# Patient Record
Sex: Female | Born: 1935
Health system: Southern US, Community
[De-identification: ages and names within clinical notes are randomized; demographics above are authoritative.]

## PROBLEM LIST (undated history)

## (undated) DIAGNOSIS — R252 Cramp and spasm: Secondary | ICD-10-CM

## (undated) DIAGNOSIS — K5900 Constipation, unspecified: Secondary | ICD-10-CM

## (undated) DIAGNOSIS — I2510 Atherosclerotic heart disease of native coronary artery without angina pectoris: Secondary | ICD-10-CM

## (undated) DIAGNOSIS — I10 Essential (primary) hypertension: Secondary | ICD-10-CM

## (undated) DIAGNOSIS — Z9289 Personal history of other medical treatment: Secondary | ICD-10-CM

## (undated) DIAGNOSIS — G459 Transient cerebral ischemic attack, unspecified: Secondary | ICD-10-CM

## (undated) DIAGNOSIS — D62 Acute posthemorrhagic anemia: Secondary | ICD-10-CM

## (undated) DIAGNOSIS — R011 Cardiac murmur, unspecified: Secondary | ICD-10-CM

## (undated) DIAGNOSIS — K862 Cyst of pancreas: Secondary | ICD-10-CM

## (undated) DIAGNOSIS — B952 Enterococcus as the cause of diseases classified elsewhere: Secondary | ICD-10-CM

## (undated) DIAGNOSIS — I219 Acute myocardial infarction, unspecified: Secondary | ICD-10-CM

## (undated) DIAGNOSIS — N390 Urinary tract infection, site not specified: Secondary | ICD-10-CM

## (undated) DIAGNOSIS — M1711 Unilateral primary osteoarthritis, right knee: Secondary | ICD-10-CM

## (undated) DIAGNOSIS — E119 Type 2 diabetes mellitus without complications: Secondary | ICD-10-CM

## (undated) DIAGNOSIS — J069 Acute upper respiratory infection, unspecified: Secondary | ICD-10-CM

## (undated) DIAGNOSIS — J189 Pneumonia, unspecified organism: Secondary | ICD-10-CM

## (undated) HISTORY — DX: Acute upper respiratory infection, unspecified: J06.9

## (undated) HISTORY — PX: ABDOMINAL HYSTERECTOMY: SHX81

## (undated) HISTORY — DX: Cardiac murmur, unspecified: R01.1

## (undated) HISTORY — DX: Atherosclerotic heart disease of native coronary artery without angina pectoris: I25.10

## (undated) HISTORY — PX: BUNIONECTOMY: SHX129

## (undated) HISTORY — DX: Transient cerebral ischemic attack, unspecified: G45.9

## (undated) HISTORY — DX: Personal history of other medical treatment: Z92.89

## (undated) HISTORY — PX: COLONOSCOPY: SHX174

## (undated) HISTORY — DX: Cyst of pancreas: K86.2

## (undated) HISTORY — DX: Type 2 diabetes mellitus without complications: E11.9

## (undated) HISTORY — DX: Essential (primary) hypertension: I10

## (undated) HISTORY — DX: Constipation, unspecified: K59.00

---

## 2005-02-22 ENCOUNTER — Ambulatory Visit (HOSPITAL_COMMUNITY): Admission: RE | Admit: 2005-02-22 | Discharge: 2005-02-22 | Payer: Self-pay | Admitting: *Deleted

## 2005-10-24 ENCOUNTER — Ambulatory Visit (HOSPITAL_BASED_OUTPATIENT_CLINIC_OR_DEPARTMENT_OTHER): Admission: RE | Admit: 2005-10-24 | Discharge: 2005-10-24 | Payer: Self-pay | Admitting: Orthopedic Surgery

## 2009-03-05 DIAGNOSIS — Z9289 Personal history of other medical treatment: Secondary | ICD-10-CM

## 2009-03-05 HISTORY — DX: Personal history of other medical treatment: Z92.89

## 2010-04-29 HISTORY — PX: COLONOSCOPY: SHX174

## 2010-08-01 ENCOUNTER — Encounter: Payer: Self-pay | Admitting: Orthopedic Surgery

## 2012-12-27 ENCOUNTER — Encounter: Payer: Self-pay | Admitting: Cardiovascular Disease

## 2013-01-15 ENCOUNTER — Other Ambulatory Visit (HOSPITAL_COMMUNITY): Payer: Self-pay

## 2013-02-11 ENCOUNTER — Ambulatory Visit (HOSPITAL_COMMUNITY): Admission: RE | Admit: 2013-02-11 | Payer: Medicare Other | Source: Ambulatory Visit

## 2013-02-22 ENCOUNTER — Ambulatory Visit (HOSPITAL_COMMUNITY): Payer: Medicare Other | Attending: Cardiovascular Disease

## 2013-02-25 DIAGNOSIS — I10 Essential (primary) hypertension: Secondary | ICD-10-CM

## 2013-03-01 ENCOUNTER — Other Ambulatory Visit: Payer: Self-pay | Admitting: Cardiovascular Disease

## 2013-03-01 LAB — CBC WITH DIFFERENTIAL/PLATELET
Basophils Absolute: 0.1 10*3/uL (ref 0.0–0.1)
Eosinophils Absolute: 0.1 10*3/uL (ref 0.0–0.7)
Eosinophils Relative: 1 % (ref 0–5)
HCT: 38 % (ref 36.0–46.0)
Hemoglobin: 13 g/dL (ref 12.0–15.0)
MCHC: 34.2 g/dL (ref 30.0–36.0)
MCV: 89.8 fL (ref 78.0–100.0)
Monocytes Absolute: 0.6 10*3/uL (ref 0.1–1.0)
Neutro Abs: 4.9 10*3/uL (ref 1.7–7.7)
RBC: 4.23 MIL/uL (ref 3.87–5.11)
WBC: 7 10*3/uL (ref 4.0–10.5)

## 2013-03-01 LAB — LIPID PANEL
LDL Cholesterol: 79 mg/dL (ref 0–99)
Total CHOL/HDL Ratio: 3.8 Ratio
VLDL: 43 mg/dL — ABNORMAL HIGH (ref 0–40)

## 2013-03-01 LAB — COMPREHENSIVE METABOLIC PANEL
ALT: 19 U/L (ref 0–35)
AST: 24 U/L (ref 0–37)
Alkaline Phosphatase: 81 U/L (ref 39–117)
CO2: 29 mEq/L (ref 19–32)
Calcium: 9.9 mg/dL (ref 8.4–10.5)
Creat: 1.25 mg/dL — ABNORMAL HIGH (ref 0.50–1.10)
Total Protein: 7 g/dL (ref 6.0–8.3)

## 2013-03-01 LAB — HEMOGLOBIN A1C: Mean Plasma Glucose: 120 mg/dL — ABNORMAL HIGH (ref <117)

## 2014-10-17 ENCOUNTER — Encounter: Payer: Self-pay | Admitting: Cardiology

## 2014-10-17 ENCOUNTER — Ambulatory Visit (INDEPENDENT_AMBULATORY_CARE_PROVIDER_SITE_OTHER): Payer: Medicare Other | Admitting: Cardiology

## 2014-10-17 VITALS — BP 128/92 | HR 56 | Ht 68.0 in | Wt 181.0 lb

## 2014-10-17 DIAGNOSIS — I351 Nonrheumatic aortic (valve) insufficiency: Secondary | ICD-10-CM

## 2014-10-17 NOTE — Patient Instructions (Signed)
Your physician recommends that you schedule a follow-up appointment in: [facility] after test  Your physician has requested that you have an echocardiogram. Echocardiography is a painless test that uses sound waves to create images of your heart. It provides your doctor with information about the size and shape of your heart and how well your heart's chambers and valves are working. This procedure takes approximately one hour. There are no restrictions for this procedure.In [place]

## 2014-10-17 NOTE — Progress Notes (Signed)
Cardiology Office Note   Date:  10/17/2014   ID:  [name], DOB 01-25-1935, MRN [id number]  PCP:  Glenda Chroman., MD  Cardiologist:   Minus Breeding, MD   Chief Complaint  Patient presents with  . New Evaluation    [id number]->[id number]  . Chest Pain    associated with pain in her back between her shoulder blades      History of Present Illness: [name] is a 80 y.o. female who presents for follow-up of aortic insufficiency. She previously saw Dr. Rollene Fare and is new to me. She had previous treadmill testing as below. She did have some aortic insufficiency with her last echocardiogram showing mild to moderate aortic insufficiency 2 years ago. She presents for follow-up. The patient denies any new symptoms such as neck or arm discomfort. There has been no new shortness of breath, PND or orthopnea. There have been no reported palpitations, presyncope or syncope.  She does have some joint pain but she does all of her household chores.  She recently [place] and with all of the kneeling and genuflecting she had some significant joint pain.  Of note there was some chest discomfort a couple of months ago when she was stressed. However, this was right-sided and around to her right shoulder. She's not had any recurrence of this.   Past Medical History  Diagnosis Date  . Diabetes   . HTN (hypertension)   . Murmur, cardiac     echo 02/25/13- EF 60-65%, impaired relaxation-grade 1 diastolic dysfunction, mild concentric lvh, mild to moderate aortic regurgitation  . H/O cardiovascular stress test 03/05/2009    normal, no ischemia  . TIA (transient ischemic attack)     carotid doppler 05/08/11-normal patency  . H/O Doppler ultrasound     lower ext arterial doppler 04/13/09-no evidence of arterial insufficiency, normal values; distal aorta 2.4x2.7    Past Surgical History  Procedure Laterality Date  . Abdominal hysterectomy       Current Outpatient Prescriptions   Medication Sig Dispense Refill  . aspirin 81 MG tablet Take 81 mg by mouth daily.    . diclofenac (VOLTAREN) 75 MG EC tablet Take 75 mg by mouth 2 (two) times daily.    Marland Kitchen glipiZIDE (GLUCOTROL XL) 5 MG 24 hr tablet Take 5 mg by mouth daily.    . metFORMIN (GLUCOPHAGE) 500 MG tablet Take 500 mg by mouth 2 (two) times daily.    . metoprolol (LOPRESSOR) 50 MG tablet Take 50 mg by mouth 2 (two) times daily.    . valsartan-hydrochlorothiazide (DIOVAN-HCT) 320-12.5 MG per tablet Take by mouth daily.    Marland Kitchen VICTOZA 18 MG/3ML SOPN 9 mLs daily.     No current facility-administered medications for this visit.    Allergies:   Codeine    Social History:  The patient  reports that she has never smoked. She does not have any smokeless tobacco history on file. She reports that she does not drink alcohol.   Family History:  The patient's family history includes Cancer in her father and sister; Diabetes in her brother, brother, and brother; Stroke in her mother.    ROS:  Please see the history of present illness.   Otherwise, review of systems are positive for decreased hearing, tinnitus, occasional diarrhea, joint pains, reflux, nocturia, edema..   All other systems are reviewed and negative.    PHYSICAL EXAM: VS:  BP 128/92 mmHg  Pulse 56  Ht 5'  8" (1.727 m)  Wt 181 lb (82.101 kg)  BMI 27.53 kg/m2 , BMI Body mass index is 27.53 kg/(m^2). GENERAL:  Well appearing HEENT:  Pupils equal round and reactive, fundi not visualized, oral mucosa unremarkable NECK:  No jugular venous distention, waveform within normal limits, carotid upstroke brisk and symmetric, no bruits, no thyromegaly LYMPHATICS:  No cervical, inguinal adenopathy LUNGS:  Clear to auscultation bilaterally BACK:  No CVA tenderness CHEST:  Unremarkable HEART:  PMI not displaced or sustained,S1 and S2 within normal limits, no S3, no S4, no clicks, no rubs, no murmurs ABD:  Flat, positive bowel sounds normal in frequency in pitch, no  bruits, no rebound, no guarding, no midline pulsatile mass, no hepatomegaly, no splenomegaly EXT:  2 plus pulses throughout, no edema, no cyanosis no clubbing SKIN:  No rashes no nodules NEURO:  Cranial nerves II through XII grossly intact, motor grossly intact throughout PSYCH:  Cognitively intact, oriented to person place and time    EKG:  EKG is ordered today. The ekg ordered today demonstrates sinus rhythm, rate 56, left axis deviation, poor anterior R wave progression, no acute ST-T wave changes.   Recent Labs: No results found for requested labs within last 365 days.     Wt Readings from Last 3 Encounters:  10/17/14 181 lb (82.101 kg)      Other studies Reviewed: Additional studies/ records that were reviewed today include: Echo, stress test. Review of the above records demonstrates:  Please see elsewhere in the note.     ASSESSMENT AND PLAN:  AI:  I don't appreciate a murmur. However, this was moderate on exam 2 years ago and so she will have a repeat echo. Since she lives in [place] we will have her follow-up there.  CHEST PAIN:  She had some very atypical chest pain a couple of months ago. She has no significant risk factors and this is not ongoing. I don't think further testing is necessary.  HTN:  The blood pressure is at target. No change in medications is indicated. We will continue with therapeutic lifestyle changes (TLC).   Current medicines are reviewed at length with the patient today.  The patient does not have concerns regarding medicines.  The following changes have been made:  no change  Labs/ tests ordered today include: Echo  No orders of the defined types were placed in this encounter.     Disposition:   FU with cardiology in [place].    Signed, Minus Breeding, MD  10/17/2014 11:22 AM    [facility]

## 2014-10-29 ENCOUNTER — Ambulatory Visit (INDEPENDENT_AMBULATORY_CARE_PROVIDER_SITE_OTHER): Payer: Medicare Other

## 2014-10-29 ENCOUNTER — Other Ambulatory Visit: Payer: Self-pay

## 2014-10-29 DIAGNOSIS — I351 Nonrheumatic aortic (valve) insufficiency: Secondary | ICD-10-CM | POA: Diagnosis not present

## 2014-10-29 DIAGNOSIS — R011 Cardiac murmur, unspecified: Secondary | ICD-10-CM

## 2014-12-26 ENCOUNTER — Ambulatory Visit (INDEPENDENT_AMBULATORY_CARE_PROVIDER_SITE_OTHER): Payer: Medicare Other | Admitting: Cardiology

## 2014-12-26 ENCOUNTER — Encounter: Payer: Self-pay | Admitting: Cardiology

## 2014-12-26 VITALS — BP 116/78 | HR 64 | Ht 68.0 in | Wt 185.0 lb

## 2014-12-26 DIAGNOSIS — I351 Nonrheumatic aortic (valve) insufficiency: Secondary | ICD-10-CM | POA: Diagnosis not present

## 2014-12-26 NOTE — Progress Notes (Signed)
Cardiology Office Note   Date:  12/26/2014   ID:  [name], DOB 04-06-1935, MRN [id number]  PCP:  [name]., MD  Cardiologist:   Madeline Breeding, MD   Chief Complaint  Patient presents with  . Aortic Regurgitation      History of Present Illness: [name] is a 80 y.o. female who presents for follow-up of aortic insufficiency. I saw her for the first time recently and repeat an echocardiogram which demonstrated this to be mild. She returns for follow-up.  Since I last saw her she has no new cardiac complaints. She denies any cardiovascular symptoms such as shortness of breath, PND or orthopnea. She's had no chest pressure, neck or arm discomfort. She's had no weight gain or edema. She is limited by knee pain.  Past Medical History  Diagnosis Date  . Diabetes   . HTN (hypertension)   . Murmur, cardiac     echo 02/25/13- EF 60-65%, impaired relaxation-grade 1 diastolic dysfunction, mild concentric lvh, mild to moderate aortic regurgitation  . H/O cardiovascular stress test 03/05/2009    normal, no ischemia  . TIA (transient ischemic attack)     carotid doppler 05/08/11-normal patency  . H/O Doppler ultrasound     lower ext arterial doppler 04/13/09-no evidence of arterial insufficiency, normal values; distal aorta 2.4x2.7    Past Surgical History  Procedure Laterality Date  . Abdominal hysterectomy       Current Outpatient Prescriptions  Medication Sig Dispense Refill  . aspirin 81 MG tablet Take 81 mg by mouth daily.    . diclofenac (VOLTAREN) 75 MG EC tablet Take 75 mg by mouth 2 (two) times daily.    Marland Kitchen glipiZIDE (GLUCOTROL XL) 5 MG 24 hr tablet Take 5 mg by mouth daily.    . metFORMIN (GLUCOPHAGE) 500 MG tablet Take 500 mg by mouth daily with breakfast.     . metoprolol (LOPRESSOR) 50 MG tablet Take 50 mg by mouth 2 (two) times daily.    . valsartan-hydrochlorothiazide (DIOVAN-HCT) 320-12.5 MG per tablet Take by mouth daily.    Marland Kitchen VICTOZA 18  MG/3ML SOPN 9 mLs daily.     No current facility-administered medications for this visit.    Allergies:   Codeine    ROS:  Please see the history of present illness.   Otherwise, review of systems are positive for decreased hearing, tinnitus, occasional diarrhea, joint pains, reflux, nocturia, edema..   All other systems are reviewed and negative.    PHYSICAL EXAM: VS:  BP 116/78 mmHg  Pulse 64  Ht 5\' 8"  (1.727 m)  Wt 185 lb (83.915 kg)  BMI 28.14 kg/m2  SpO2 96% , BMI Body mass index is 28.14 kg/(m^2). GENERAL:  Well appearing NECK:  No jugular venous distention, waveform within normal limits, carotid upstroke brisk and symmetric, no bruits, no thyromegaly LUNGS:  Clear to auscultation bilaterally BACK:  No CVA tenderness CHEST:  Unremarkable HEART:  PMI not displaced or sustained,S1 and S2 within normal limits, no S3, no S4, no clicks, no rubs, no murmurs ABD:  Flat, positive bowel sounds normal in frequency in pitch, no bruits, no rebound, no guarding, no midline pulsatile mass, no hepatomegaly, no splenomegaly EXT:  2 plus pulses throughout, no edema, no cyanosis no clubbing   EKG:  EKG is not ordered today.   Recent Labs: No results found for requested labs within last 365 days.     Wt Readings from Last 3 Encounters:  12/26/14 185 lb (  83.915 kg)  10/17/14 181 lb (82.101 kg)      Other studies Reviewed: Additional studies/ records that were reviewed today include: none    ASSESSMENT AND PLAN:  AI:  This was mild on echo. This can be followed clinically. No further testing is indicated.  HTN:  The blood pressure is at target. No change in medications is indicated. We will continue with therapeutic lifestyle changes (TLC).   Current medicines are reviewed at length with the patient today.  The patient does not have concerns regarding medicines.  The following changes have been made:  no change  Labs/ tests ordered today include:    Disposition:   FU with  cardiology in [place] in one year.    Signed, Madeline Breeding, MD  12/26/2014 9:45 AM    [place]

## 2014-12-26 NOTE — Patient Instructions (Signed)
Continue all current medications. Your physician wants you to follow up in:  1 year.  You will receive a reminder letter in the mail one-two months in advance.  If you don't receive a letter, please call our office to schedule the follow up appointment   

## 2015-08-04 DIAGNOSIS — R5383 Other fatigue: Secondary | ICD-10-CM | POA: Diagnosis not present

## 2015-08-04 DIAGNOSIS — Z6827 Body mass index (BMI) 27.0-27.9, adult: Secondary | ICD-10-CM | POA: Diagnosis not present

## 2015-08-04 DIAGNOSIS — Z1211 Encounter for screening for malignant neoplasm of colon: Secondary | ICD-10-CM | POA: Diagnosis not present

## 2015-08-04 DIAGNOSIS — Z0000 Encounter for general adult medical examination without abnormal findings: Secondary | ICD-10-CM | POA: Diagnosis not present

## 2015-08-04 DIAGNOSIS — Z1389 Encounter for screening for other disorder: Secondary | ICD-10-CM | POA: Diagnosis not present

## 2015-08-04 DIAGNOSIS — Z7189 Other specified counseling: Secondary | ICD-10-CM | POA: Diagnosis not present

## 2015-08-04 DIAGNOSIS — Z418 Encounter for other procedures for purposes other than remedying health state: Secondary | ICD-10-CM | POA: Diagnosis not present

## 2015-08-04 DIAGNOSIS — Z79899 Other long term (current) drug therapy: Secondary | ICD-10-CM | POA: Diagnosis not present

## 2015-08-13 DIAGNOSIS — M25511 Pain in right shoulder: Secondary | ICD-10-CM | POA: Diagnosis not present

## 2015-08-13 DIAGNOSIS — J069 Acute upper respiratory infection, unspecified: Secondary | ICD-10-CM | POA: Diagnosis not present

## 2015-08-13 DIAGNOSIS — M1611 Unilateral primary osteoarthritis, right hip: Secondary | ICD-10-CM | POA: Diagnosis not present

## 2015-08-13 DIAGNOSIS — M25551 Pain in right hip: Secondary | ICD-10-CM | POA: Diagnosis not present

## 2015-08-13 DIAGNOSIS — M19011 Primary osteoarthritis, right shoulder: Secondary | ICD-10-CM | POA: Diagnosis not present

## 2015-08-13 DIAGNOSIS — E1142 Type 2 diabetes mellitus with diabetic polyneuropathy: Secondary | ICD-10-CM | POA: Diagnosis not present

## 2015-08-13 DIAGNOSIS — M160 Bilateral primary osteoarthritis of hip: Secondary | ICD-10-CM | POA: Diagnosis not present

## 2015-08-19 DIAGNOSIS — M25511 Pain in right shoulder: Secondary | ICD-10-CM | POA: Diagnosis not present

## 2015-08-24 DIAGNOSIS — E1140 Type 2 diabetes mellitus with diabetic neuropathy, unspecified: Secondary | ICD-10-CM | POA: Diagnosis not present

## 2015-08-24 DIAGNOSIS — E1151 Type 2 diabetes mellitus with diabetic peripheral angiopathy without gangrene: Secondary | ICD-10-CM | POA: Diagnosis not present

## 2015-08-27 DIAGNOSIS — E1142 Type 2 diabetes mellitus with diabetic polyneuropathy: Secondary | ICD-10-CM | POA: Diagnosis not present

## 2015-08-27 DIAGNOSIS — M25519 Pain in unspecified shoulder: Secondary | ICD-10-CM | POA: Diagnosis not present

## 2015-08-27 DIAGNOSIS — E1140 Type 2 diabetes mellitus with diabetic neuropathy, unspecified: Secondary | ICD-10-CM | POA: Diagnosis not present

## 2015-09-23 DIAGNOSIS — I10 Essential (primary) hypertension: Secondary | ICD-10-CM | POA: Diagnosis not present

## 2015-09-23 DIAGNOSIS — E119 Type 2 diabetes mellitus without complications: Secondary | ICD-10-CM | POA: Diagnosis not present

## 2015-09-29 DIAGNOSIS — S46011A Strain of muscle(s) and tendon(s) of the rotator cuff of right shoulder, initial encounter: Secondary | ICD-10-CM | POA: Diagnosis not present

## 2015-09-29 DIAGNOSIS — M1711 Unilateral primary osteoarthritis, right knee: Secondary | ICD-10-CM | POA: Diagnosis not present

## 2015-10-27 ENCOUNTER — Ambulatory Visit: Payer: Medicare Other | Admitting: Cardiovascular Disease

## 2015-10-27 DIAGNOSIS — I10 Essential (primary) hypertension: Secondary | ICD-10-CM | POA: Diagnosis not present

## 2015-10-27 DIAGNOSIS — M159 Polyosteoarthritis, unspecified: Secondary | ICD-10-CM | POA: Diagnosis not present

## 2015-10-27 DIAGNOSIS — E1142 Type 2 diabetes mellitus with diabetic polyneuropathy: Secondary | ICD-10-CM | POA: Diagnosis not present

## 2015-10-27 DIAGNOSIS — M1710 Unilateral primary osteoarthritis, unspecified knee: Secondary | ICD-10-CM | POA: Diagnosis not present

## 2015-10-28 ENCOUNTER — Ambulatory Visit (INDEPENDENT_AMBULATORY_CARE_PROVIDER_SITE_OTHER): Payer: Medicare Other | Admitting: Cardiovascular Disease

## 2015-10-28 ENCOUNTER — Encounter: Payer: Self-pay | Admitting: Cardiovascular Disease

## 2015-10-28 VITALS — BP 148/80 | HR 65 | Ht 69.0 in | Wt 181.0 lb

## 2015-10-28 DIAGNOSIS — Z136 Encounter for screening for cardiovascular disorders: Secondary | ICD-10-CM

## 2015-10-28 DIAGNOSIS — Z01818 Encounter for other preprocedural examination: Secondary | ICD-10-CM | POA: Diagnosis not present

## 2015-10-28 DIAGNOSIS — I10 Essential (primary) hypertension: Secondary | ICD-10-CM | POA: Diagnosis not present

## 2015-10-28 DIAGNOSIS — I351 Nonrheumatic aortic (valve) insufficiency: Secondary | ICD-10-CM | POA: Diagnosis not present

## 2015-10-28 NOTE — Patient Instructions (Signed)
Your physician recommends that you continue on your current medications as directed. Please refer to the Current Medication list given to you today.  Your physician recommends that you schedule a follow-up appointment in: as needed  

## 2015-10-28 NOTE — Progress Notes (Signed)
Patient ID: [name], female   DOB: [birth year], 81 y.o.   MRN: [id number]      SUBJECTIVE: The patient is a 81 year old woman who is being scheduled to undergoright knee replacement surgery. She has a history of mild aortic regurgitation and saw Dr. Percival Spanish on 12/26/14. Echocardiogram on 10/29/14 also demonstrated normal left ventricular systolic function and regional wall motion, LVEF 55-60%, and grade 1 diastolic dysfunction. She underwent a normal nuclear stress test on 03/05/09.  She denies chest pain and shortness of breath. Mobility is primarily restricted by lower extremity arthritis. Primary complaints relate to sleep disturbance and diffuse muscle cramps of the hands and feet.  ECG today shows sinus rhythm with a mild nonspecific ST segment abnormality.  Review of Systems: As per "subjective", otherwise negative.  Allergies  Allergen Reactions  . Codeine     Current Outpatient Prescriptions  Medication Sig Dispense Refill  . aspirin 81 MG tablet Take 81 mg by mouth daily.    . diclofenac (VOLTAREN) 75 MG EC tablet Take 75 mg by mouth 2 (two) times daily.    Marland Kitchen glipiZIDE (GLUCOTROL XL) 5 MG 24 hr tablet Take 5 mg by mouth daily.    . metFORMIN (GLUCOPHAGE) 500 MG tablet Take 500 mg by mouth daily with breakfast.     . metoprolol (LOPRESSOR) 50 MG tablet Take 50 mg by mouth 2 (two) times daily.    . valsartan-hydrochlorothiazide (DIOVAN-HCT) 320-12.5 MG per tablet Take by mouth daily.    Marland Kitchen VICTOZA 18 MG/3ML SOPN 9 mLs daily. Doesn't use daily     No current facility-administered medications for this visit.    Past Medical History  Diagnosis Date  . Diabetes ([facility])   . HTN (hypertension)   . Murmur, cardiac     echo 02/25/13- EF 60-65%, impaired relaxation-grade 1 diastolic dysfunction, mild concentric lvh, mild to moderate aortic regurgitation  . H/O cardiovascular stress test 03/05/2009    normal, no ischemia  . TIA (transient ischemic attack)     carotid doppler  05/08/11-normal patency  . H/O Doppler ultrasound     lower ext arterial doppler 04/13/09-no evidence of arterial insufficiency, normal values; distal aorta 2.4x2.7    Past Surgical History  Procedure Laterality Date  . Abdominal hysterectomy      Social History   Social History  . Marital Status: Married    Spouse Name: N/A  . Number of Children: 2  . Years of Education: N/A   Occupational History  . Not on file.   Social History Main Topics  . Smoking status: Never Smoker   . Smokeless tobacco: Never Used  . Alcohol Use: No  . Drug Use: Not on file  . Sexual Activity: Not on file   Other Topics Concern  . Not on file   Social History Narrative   Step son lives with her.  She is a widow.     Filed Vitals:   10/28/15 1517  BP: 148/80  Pulse: 65  Height: 5\' 9"  (1.753 m)  Weight: 181 lb (82.101 kg)  SpO2: 99%    PHYSICAL EXAM General: NAD HEENT: Normal. Neck: No JVD, no thyromegaly. Lungs: Clear to auscultation bilaterally with normal respiratory effort. CV: Nondisplaced PMI.  Regular rate and rhythm, normal S1/S2, no S3/S4, no murmur. No pretibial or periankle edema.  No carotid bruit.   Abdomen: Soft, nontender, no distention.  Neurologic: Alert and oriented.  Psych: Normal affect. Skin: Normal.  ECG: Most recent ECG reviewed.  ASSESSMENT AND PLAN: 1. Preoperative risk stratification: The patient can proceed with right knee replacement surgery with an acceptable level of risk. She has normal left ventricular systolic function. No stress testing is warranted at this time.  2. Aortic regurgitation: Only mild. No further testing warranted at this time. No murmurs on exam.  3. Essential HTN: Mildly elevated. Needs monitoring.  Dispo: fu prn.  Kate Sable, M.D., F.A.C.C.

## 2015-11-04 ENCOUNTER — Encounter (HOSPITAL_COMMUNITY): Payer: Self-pay | Admitting: Physician Assistant

## 2015-11-04 DIAGNOSIS — M1711 Unilateral primary osteoarthritis, right knee: Secondary | ICD-10-CM | POA: Diagnosis not present

## 2015-11-04 DIAGNOSIS — E119 Type 2 diabetes mellitus without complications: Secondary | ICD-10-CM

## 2015-11-04 DIAGNOSIS — R011 Cardiac murmur, unspecified: Secondary | ICD-10-CM | POA: Diagnosis present

## 2015-11-04 DIAGNOSIS — I10 Essential (primary) hypertension: Secondary | ICD-10-CM | POA: Diagnosis present

## 2015-11-04 DIAGNOSIS — G459 Transient cerebral ischemic attack, unspecified: Secondary | ICD-10-CM | POA: Diagnosis present

## 2015-11-04 NOTE — H&P (Signed)
TOTAL KNEE ADMISSION H&P  Patient is being admitted for right total knee arthroplasty.  Subjective:  Chief Complaint:right knee pain.  HPI: [name], 81 y.o. female, has a history of pain and functional disability in the right knee due to arthritis and has failed non-surgical conservative treatments for greater than 12 weeks to includeNSAID's and/or analgesics, corticosteriod injections, viscosupplementation injections, flexibility and strengthening excercises, supervised PT with diminished ADL's post treatment, use of assistive devices, weight reduction as appropriate and activity modification.  Onset of symptoms was gradual, starting 8 years ago with gradually worsening course since that time. The patient noted no past surgery on the right knee(s).  Patient currently rates pain in the right knee(s) at 10 out of 10 with activity. Patient has night pain, worsening of pain with activity and weight bearing, pain that interferes with activities of daily living, crepitus and joint swelling.  Patient has evidence of subchondral sclerosis, periarticular osteophytes and joint space narrowing by imaging studies.  There is no active infection.  Patient Active Problem List   Diagnosis Date Noted  . Primary localized osteoarthritis of right knee   . Diabetes ([facility])   . HTN (hypertension)   . Murmur, cardiac   . TIA (transient ischemic attack)   . AI (aortic insufficiency) 12/26/2014  . H/O cardiovascular stress test 03/05/2009   Past Medical History  Diagnosis Date  . Diabetes ([facility])   . HTN (hypertension)   . Murmur, cardiac     echo 02/25/13- EF 60-65%, impaired relaxation-grade 1 diastolic dysfunction, mild concentric lvh, mild to moderate aortic regurgitation  . H/O cardiovascular stress test 03/05/2009    normal, no ischemia  . TIA (transient ischemic attack)     carotid doppler 05/08/11-normal patency  . H/O Doppler ultrasound     lower ext arterial doppler 04/13/09-no evidence of  arterial insufficiency, normal values; distal aorta 2.4x2.7  . Primary localized osteoarthritis of right knee     Past Surgical History  Procedure Laterality Date  . Abdominal hysterectomy    No current facility-administered medications for this encounter.  Current outpatient prescriptions:  .  aspirin 81 MG tablet, Take 81 mg by mouth daily., Disp: , Rfl:  .  diclofenac (VOLTAREN) 75 MG EC tablet, Take 75 mg by mouth 2 (two) times daily., Disp: , Rfl:  .  glipiZIDE (GLUCOTROL XL) 5 MG 24 hr tablet, Take 5 mg by mouth daily., Disp: , Rfl:  .  metFORMIN (GLUCOPHAGE) 500 MG tablet, Take 500 mg by mouth daily with breakfast. , Disp: , Rfl:  .  metoprolol (LOPRESSOR) 50 MG tablet, Take 50 mg by mouth 2 (two) times daily., Disp: , Rfl:  .  valsartan-hydrochlorothiazide (DIOVAN-HCT) 320-12.5 MG per tablet, Take 1 tablet by mouth daily. , Disp: , Rfl:  .  VICTOZA 18 MG/3ML SOPN, Inject 3.6 mg into the skin daily as needed (sugar >120). , Disp: , Rfl:     Allergies  Allergen Reactions  . Codeine Nausea Only  . Milk-Related Compounds Diarrhea    Social History  Substance Use Topics  . Smoking status: Never Smoker   . Smokeless tobacco: Never Used  . Alcohol Use: No    Family History  Problem Relation Age of Onset  . Stroke Mother   . Cancer Father   . Diabetes Brother   . Diabetes Brother   . Diabetes Brother   . Cancer Sister      Review of Systems  Constitutional: Negative.   Respiratory: Negative.   Cardiovascular:  Negative.   Gastrointestinal: Negative.   Genitourinary: Negative.   Musculoskeletal: Positive for back pain, joint pain and neck pain.       Bilateral knee pain   Right greater than left  Skin: Negative.   Neurological: Negative.   Endo/Heme/Allergies: Negative.   Psychiatric/Behavioral: Negative.     Objective:  Physical Exam  Constitutional: She is oriented to person, place, and time. She appears well-developed and well-nourished.  HENT:  Head:  Normocephalic and atraumatic.  Mouth/Throat: Oropharynx is clear and moist.  Eyes: Conjunctivae are normal. Pupils are equal, round, and reactive to light.  Neck: Neck supple.  Cardiovascular: Normal rate.   Murmur heard. Respiratory: Effort normal.  GI: Soft.  Genitourinary:  Not pertinent to current symptomatology therefore not examined.  Musculoskeletal:  Examination of her right knee reveals moderate valgus deformity.  1+ synovitis.  Diffuse pain.  Range of motion -5 to 125 degrees.  Knee is stable with normal patella tracking.  Examination of her left knee reveals full range of motion without pain, swelling, weakness or instability.  Vascular exam: Pulses are 2+ and symmetric.  Examination of her right shoulder reveals 80% range of motion with pain and moderate weakness on rotator cuff stressing.  No instability.    Neurological: She is alert and oriented to person, place, and time.  Skin: Skin is warm and dry.  Psychiatric: She has a normal mood and affect. Her behavior is normal.    Vital signs in last 24 hours: Temp:  [98 F (36.7 C)] 98 F (36.7 C) (04/26 1500) Pulse Rate:  [68] 68 (04/26 1500) BP: (124)/(73) 124/73 mmHg (04/26 1500) SpO2:  [97 %] 97 % (04/26 1500) Weight:  [81.194 kg (179 lb)] 81.194 kg (179 lb) (04/26 1500)  Labs:   Estimated body mass index is 29.79 kg/(m^2) as calculated from the following:   Height as of this encounter: 5\' 5"  (1.651 m).   Weight as of this encounter: 81.194 kg (179 lb).   Imaging Review Plain radiographs demonstrate severe degenerative joint disease of the right knee(s). The overall alignment ismild valgus. The bone quality appears to be good for age and reported activity level.  Assessment/Plan:  End stage arthritis, right knee  Active Problems:   AI (aortic insufficiency)   Primary localized osteoarthritis of right knee   Diabetes ([facility])   HTN (hypertension)   Murmur, cardiac   H/O cardiovascular stress test   TIA  (transient ischemic attack)   The patient history, physical examination, clinical judgment of the provider and imaging studies are consistent with end stage degenerative joint disease of the right knee(s) and total knee arthroplasty is deemed medically necessary. The treatment options including medical management, injection therapy arthroscopy and arthroplasty were discussed at length. The risks and benefits of total knee arthroplasty were presented and reviewed. The risks due to aseptic loosening, infection, stiffness, patella tracking problems, thromboembolic complications and other imponderables were discussed. The patient acknowledged the explanation, agreed to proceed with the plan and consent was signed. Patient is being admitted for inpatient treatment for surgery, pain control, PT, OT, prophylactic antibiotics, VTE prophylaxis, progressive ambulation and ADL's and discharge planning. The patient is planning to be discharged home with home health services

## 2015-11-05 ENCOUNTER — Encounter (HOSPITAL_COMMUNITY): Payer: Self-pay

## 2015-11-05 ENCOUNTER — Encounter (HOSPITAL_COMMUNITY)
Admission: RE | Admit: 2015-11-05 | Discharge: 2015-11-05 | Disposition: A | Discharge status: Home / Self Care | Payer: Medicare Other | Source: Ambulatory Visit | Attending: Orthopedic Surgery | Admitting: Orthopedic Surgery

## 2015-11-05 DIAGNOSIS — Z7982 Long term (current) use of aspirin: Secondary | ICD-10-CM | POA: Insufficient documentation

## 2015-11-05 DIAGNOSIS — I351 Nonrheumatic aortic (valve) insufficiency: Secondary | ICD-10-CM | POA: Diagnosis not present

## 2015-11-05 DIAGNOSIS — I10 Essential (primary) hypertension: Secondary | ICD-10-CM | POA: Insufficient documentation

## 2015-11-05 DIAGNOSIS — Z0183 Encounter for blood typing: Secondary | ICD-10-CM | POA: Insufficient documentation

## 2015-11-05 DIAGNOSIS — M1711 Unilateral primary osteoarthritis, right knee: Secondary | ICD-10-CM | POA: Insufficient documentation

## 2015-11-05 DIAGNOSIS — Z7984 Long term (current) use of oral hypoglycemic drugs: Secondary | ICD-10-CM | POA: Diagnosis not present

## 2015-11-05 DIAGNOSIS — Z01818 Encounter for other preprocedural examination: Secondary | ICD-10-CM | POA: Insufficient documentation

## 2015-11-05 DIAGNOSIS — Z79899 Other long term (current) drug therapy: Secondary | ICD-10-CM | POA: Insufficient documentation

## 2015-11-05 DIAGNOSIS — Z8673 Personal history of transient ischemic attack (TIA), and cerebral infarction without residual deficits: Secondary | ICD-10-CM | POA: Diagnosis not present

## 2015-11-05 DIAGNOSIS — Z01812 Encounter for preprocedural laboratory examination: Secondary | ICD-10-CM | POA: Diagnosis not present

## 2015-11-05 DIAGNOSIS — E119 Type 2 diabetes mellitus without complications: Secondary | ICD-10-CM | POA: Diagnosis not present

## 2015-11-05 LAB — COMPREHENSIVE METABOLIC PANEL
ALBUMIN: 4.1 g/dL (ref 3.5–5.0)
ALK PHOS: 110 U/L (ref 38–126)
ALT: 17 U/L (ref 14–54)
ANION GAP: 12 (ref 5–15)
AST: 25 U/L (ref 15–41)
BUN: 23 mg/dL — ABNORMAL HIGH (ref 6–20)
CALCIUM: 9.7 mg/dL (ref 8.9–10.3)
CO2: 25 mmol/L (ref 22–32)
Chloride: 106 mmol/L (ref 101–111)
Creatinine, Ser: 1.39 mg/dL — ABNORMAL HIGH (ref 0.44–1.00)
GFR calc non Af Amer: 35 mL/min — ABNORMAL LOW (ref >60)
GFR, EST AFRICAN AMERICAN: 41 mL/min — AB (ref >60)
GLUCOSE: 63 mg/dL — AB (ref 65–99)
POTASSIUM: 3.7 mmol/L (ref 3.5–5.1)
SODIUM: 143 mmol/L (ref 135–145)
Total Bilirubin: 0.5 mg/dL (ref 0.3–1.2)
Total Protein: 7.4 g/dL (ref 6.5–8.1)

## 2015-11-05 LAB — CBC WITH DIFFERENTIAL/PLATELET
BASOS PCT: 1 %
Basophils Absolute: 0.1 10*3/uL (ref 0.0–0.1)
EOS ABS: 0.1 10*3/uL (ref 0.0–0.7)
EOS PCT: 1 %
HCT: 39.3 % (ref 36.0–46.0)
Hemoglobin: 12.8 g/dL (ref 12.0–15.0)
LYMPHS ABS: 1.6 10*3/uL (ref 0.7–4.0)
Lymphocytes Relative: 19 %
MCH: 29.4 pg (ref 26.0–34.0)
MCHC: 32.6 g/dL (ref 30.0–36.0)
MCV: 90.3 fL (ref 78.0–100.0)
MONO ABS: 0.7 10*3/uL (ref 0.1–1.0)
MONOS PCT: 8 %
NEUTROS PCT: 71 %
Neutro Abs: 6.1 10*3/uL (ref 1.7–7.7)
PLATELETS: 186 10*3/uL (ref 150–400)
RBC: 4.35 MIL/uL (ref 3.87–5.11)
RDW: 13.2 % (ref 11.5–15.5)
WBC: 8.5 10*3/uL (ref 4.0–10.5)

## 2015-11-05 LAB — PROTIME-INR
INR: 1.12 (ref 0.00–1.49)
Prothrombin Time: 14.6 seconds (ref 11.6–15.2)

## 2015-11-05 LAB — SURGICAL PCR SCREEN
MRSA, PCR: NEGATIVE
Staphylococcus aureus: NEGATIVE

## 2015-11-05 LAB — TYPE AND SCREEN
ABO/RH(D): A POS
ANTIBODY SCREEN: NEGATIVE

## 2015-11-05 LAB — ABO/RH: ABO/RH(D): A POS

## 2015-11-05 LAB — GLUCOSE, CAPILLARY: GLUCOSE-CAPILLARY: 121 mg/dL — AB (ref 65–99)

## 2015-11-05 LAB — APTT: aPTT: 27 seconds (ref 24–37)

## 2015-11-05 MED ORDER — CHLORHEXIDINE GLUCONATE 4 % EX LIQD: 60.0000 mL | Freq: Once | CUTANEOUS | Status: DC

## 2015-11-05 NOTE — Pre-Procedure Instructions (Addendum)
MURLE HEREFORD  11/05/2015      [facility] - [place], [place] - [place] [address] Phone: [phone] Fax: [phone]    Your procedure is scheduled on Nov 16, 2015.  Report to [facility] Admitting at 7 A.M.  Call this number if you have problems the morning of surgery:  [phone]   Remember:  Do not eat food or drink liquids after midnight.  Take these medicines the morning of surgery with A SIP OF WATER : metoprolol (LOPRESSOR)    STOP ASPIRIN, NSAID'S (ADVIL, IBUPROFEN, DICLOFENAC), HERBAL MEDICATIONS ONE  WEEK PRIOR TO SURGERY     How to Manage Your Diabetes Before and After Surgery  Why is it important to control my blood sugar before and after surgery? . Improving blood sugar levels before and after surgery helps healing and can limit problems. . A way of improving blood sugar control is eating a healthy diet by: o  Eating less sugar and carbohydrates o  Increasing activity/exercise o  Talking with your doctor about reaching your blood sugar goals . High blood sugars (greater than 180 mg/dL) can raise your risk of infections and slow your recovery, so you will need to focus on controlling your diabetes during the weeks before surgery. . Make sure that the doctor who takes care of your diabetes knows about your planned surgery including the date and location.  How do I manage my blood sugar before surgery? . Check your blood sugar at least 4 times a day, starting 2 days before surgery, to make sure that the level is not too high or low. o Check your blood sugar the morning of your surgery when you wake up and every 2 hours until you get to the Short Stay unit. . If your blood sugar is less than 70 mg/dL, you will need to treat for low blood sugar: o Do not take insulin. o Treat a low blood sugar (less than 70 mg/dL) with  cup of clear juice (cranberry or apple), 4 glucose tablets, OR glucose gel. o Recheck blood sugar  in 15 minutes after treatment (to make sure it is greater than 70 mg/dL). If your blood sugar is not greater than 70 mg/dL on recheck, call [phone] for further instructions. . Report your blood sugar to the short stay nurse when you get to Short Stay.  . If you are admitted to the hospital after surgery: o Your blood sugar will be checked by the staff and you will probably be given insulin after surgery (instead of oral diabetes medicines) to make sure you have good blood sugar levels. o The goal for blood sugar control after surgery is 80-180 mg/dL.     WHAT DO I DO ABOUT MY DIABETES MEDICATION?   Marland Kitchen Do not take oral diabetes medicines (pills) the morning of surgery.   . The day of surgery, do not take other diabetes injectables, including Byetta (exenatide), Bydureon (exenatide ER), Victoza (liraglutide), or Trulicity (dulaglutide).    Patient Signature:  Date:   Nurse Signature:  Date:   Reviewed and Endorsed by [facility] Patient Education Committee, August 2015    Do not wear jewelry, make-up or nail polish.  Do not wear lotions, powders, or perfumes.  You may wear deodorant.  Do not shave 48 hours prior to surgery.    Do not bring valuables to the hospital.  [facility] is not responsible for any belongings or valuables.  Contacts, dentures or bridgework may not be worn into surgery.  Leave your suitcase in the car.  After surgery it may be brought to your room.  For patients admitted to the hospital, discharge time will be determined by your treatment team.  Patients discharged the day of surgery will not be allowed to drive home.   Name and phone number of your driver:    Special instructions:  "PREPARING FOR SURGERY"  Please read over the following fact sheets that you were given. Pain Booklet, Coughing and Deep Breathing, Blood Transfusion Information, Total Joint Packet and Surgical Site Infection Prevention

## 2015-11-06 ENCOUNTER — Encounter (HOSPITAL_COMMUNITY): Payer: Self-pay | Admitting: Vascular Surgery

## 2015-11-06 ENCOUNTER — Encounter (HOSPITAL_COMMUNITY): Payer: Self-pay | Admitting: Anesthesiology

## 2015-11-06 LAB — HEMOGLOBIN A1C
HEMOGLOBIN A1C: 6.5 % — AB (ref 4.8–5.6)
MEAN PLASMA GLUCOSE: 140 mg/dL

## 2015-11-06 LAB — URINE CULTURE

## 2015-11-06 NOTE — Progress Notes (Signed)
Anesthesia Chart Review: Patient is a 81 year old female scheduled for right TKR on 11/16/15 by Dr. Noemi Chapel.  History includes non-smoker, DM2, HTN, murmur with mild AR, TIA '12, hysterectomy. PCP is listed as Dr. Jerene Bears.  She saw cardiologist Dr. Bronson Ing on 10/28/15. He wrote, "1. Preoperative risk stratification: The patient can proceed with right knee replacement surgery with an acceptable level of risk. She has normal left ventricular systolic function. No stress testing is warranted at this time." PRN cardiology follow-up recommended.  Meds include ASA 81 mg, glipizide, metformin, metoprolol, Diovan-HCT, Victoza.  10/28/15 EKG: SB at 52 bpm, low voltage in precordial leads, voltage criteria for LVH, poor R wave progression, may be secondary to LVH, consider old anterior infarct. Non-specific ST depression, non-diagnostic T wave abnormality.  10/29/14 Echo: Impressions: - Normal LV wall thickness and chamber size with LVEF 55-60% and  grade 1 diastolic dysfunction. Mild left atrial enlargement.  Mildly calcified aortic valve with mild aortic regurgitation.  05/06/11 Carotid U/S: Normal carotid Doppler evaluation.  03/05/09 Nuclear stress test: Normal myocardial perfusion study. Post stress EF 60%. No significant wall motion abnormalities. Low risk scan.  Preoperative labs noted. Cr 1.39. CBC, PT/PTT WNL. A1c 6.5. T&S done. Urine culture showed: MULTIPLE SPEC.IES PRESENT, SUGGEST RECOLLECTION.  If no acute changes then I would anticipate that she could proceed as planned.  George Hugh [facility] Short Stay Center/Anesthesiology Phone [phone] 11/06/2015 3:26 PM

## 2015-11-10 DIAGNOSIS — K121 Other forms of stomatitis: Secondary | ICD-10-CM | POA: Diagnosis not present

## 2015-11-10 DIAGNOSIS — M1711 Unilateral primary osteoarthritis, right knee: Secondary | ICD-10-CM | POA: Diagnosis not present

## 2015-11-10 DIAGNOSIS — E1140 Type 2 diabetes mellitus with diabetic neuropathy, unspecified: Secondary | ICD-10-CM | POA: Diagnosis not present

## 2015-11-10 DIAGNOSIS — E1151 Type 2 diabetes mellitus with diabetic peripheral angiopathy without gangrene: Secondary | ICD-10-CM | POA: Diagnosis not present

## 2015-11-12 DIAGNOSIS — T148 Other injury of unspecified body region: Secondary | ICD-10-CM | POA: Diagnosis not present

## 2015-11-12 DIAGNOSIS — E1142 Type 2 diabetes mellitus with diabetic polyneuropathy: Secondary | ICD-10-CM | POA: Diagnosis not present

## 2015-11-12 DIAGNOSIS — Z299 Encounter for prophylactic measures, unspecified: Secondary | ICD-10-CM | POA: Diagnosis not present

## 2015-11-12 DIAGNOSIS — S161XXA Strain of muscle, fascia and tendon at neck level, initial encounter: Secondary | ICD-10-CM | POA: Diagnosis not present

## 2015-11-13 DIAGNOSIS — M25562 Pain in left knee: Secondary | ICD-10-CM | POA: Diagnosis not present

## 2015-11-13 DIAGNOSIS — Z833 Family history of diabetes mellitus: Secondary | ICD-10-CM | POA: Diagnosis not present

## 2015-11-13 DIAGNOSIS — M5030 Other cervical disc degeneration, unspecified cervical region: Secondary | ICD-10-CM | POA: Diagnosis not present

## 2015-11-13 DIAGNOSIS — Z8249 Family history of ischemic heart disease and other diseases of the circulatory system: Secondary | ICD-10-CM | POA: Diagnosis not present

## 2015-11-13 DIAGNOSIS — M1990 Unspecified osteoarthritis, unspecified site: Secondary | ICD-10-CM | POA: Diagnosis present

## 2015-11-13 DIAGNOSIS — M4692 Unspecified inflammatory spondylopathy, cervical region: Secondary | ICD-10-CM | POA: Diagnosis present

## 2015-11-13 DIAGNOSIS — M542 Cervicalgia: Secondary | ICD-10-CM | POA: Diagnosis present

## 2015-11-13 DIAGNOSIS — M25561 Pain in right knee: Secondary | ICD-10-CM | POA: Diagnosis not present

## 2015-11-13 DIAGNOSIS — M179 Osteoarthritis of knee, unspecified: Secondary | ICD-10-CM | POA: Diagnosis not present

## 2015-11-13 DIAGNOSIS — R509 Fever, unspecified: Secondary | ICD-10-CM | POA: Diagnosis not present

## 2015-11-13 DIAGNOSIS — Z79899 Other long term (current) drug therapy: Secondary | ICD-10-CM | POA: Diagnosis not present

## 2015-11-13 DIAGNOSIS — J189 Pneumonia, unspecified organism: Secondary | ICD-10-CM | POA: Diagnosis not present

## 2015-11-13 DIAGNOSIS — E119 Type 2 diabetes mellitus without complications: Secondary | ICD-10-CM | POA: Diagnosis not present

## 2015-11-13 DIAGNOSIS — R0902 Hypoxemia: Secondary | ICD-10-CM | POA: Diagnosis not present

## 2015-11-13 DIAGNOSIS — I10 Essential (primary) hypertension: Secondary | ICD-10-CM | POA: Diagnosis present

## 2015-11-13 DIAGNOSIS — M1711 Unilateral primary osteoarthritis, right knee: Secondary | ICD-10-CM | POA: Diagnosis not present

## 2015-11-13 DIAGNOSIS — K8020 Calculus of gallbladder without cholecystitis without obstruction: Secondary | ICD-10-CM | POA: Diagnosis present

## 2015-11-13 DIAGNOSIS — Z886 Allergy status to analgesic agent status: Secondary | ICD-10-CM | POA: Diagnosis not present

## 2015-11-13 DIAGNOSIS — E860 Dehydration: Secondary | ICD-10-CM | POA: Diagnosis not present

## 2015-11-13 DIAGNOSIS — Z7984 Long term (current) use of oral hypoglycemic drugs: Secondary | ICD-10-CM | POA: Diagnosis not present

## 2015-11-16 ENCOUNTER — Encounter (HOSPITAL_COMMUNITY): Admission: RE | Payer: Self-pay | Source: Ambulatory Visit

## 2015-11-16 ENCOUNTER — Inpatient Hospital Stay (HOSPITAL_COMMUNITY): Admission: RE | Admit: 2015-11-16 | Payer: Medicare Other | Source: Ambulatory Visit | Admitting: Orthopedic Surgery

## 2015-11-16 HISTORY — DX: Unilateral primary osteoarthritis, right knee: M17.11

## 2015-11-16 SURGERY — ARTHROPLASTY, KNEE, TOTAL
Anesthesia: General | Laterality: Right

## 2015-11-16 NOTE — Interval H&P Note (Signed)
History and Physical Interval Note:  11/16/2015 6:07 AM  [name]  has presented today for surgery, with the diagnosis PRIMARY LOCALIZED OA RIGHT KNEE  The various methods of treatment have been discussed with the patient and family. After consideration of risks, benefits and other options for treatment, the patient has consented to  Procedure(s): RIGHT TOTAL KNEE ARTHROPLASTY (Right) as a surgical intervention .  The patient's history has been reviewed, patient examined, no change in status, stable for surgery.  I have reviewed the patient's chart and labs.  Questions were answered to the patient's satisfaction.     Elsie Saas A

## 2015-11-17 DIAGNOSIS — M1711 Unilateral primary osteoarthritis, right knee: Secondary | ICD-10-CM | POA: Diagnosis not present

## 2015-11-23 DIAGNOSIS — Z09 Encounter for follow-up examination after completed treatment for conditions other than malignant neoplasm: Secondary | ICD-10-CM | POA: Diagnosis not present

## 2015-11-23 DIAGNOSIS — J189 Pneumonia, unspecified organism: Secondary | ICD-10-CM | POA: Diagnosis not present

## 2015-11-23 DIAGNOSIS — I10 Essential (primary) hypertension: Secondary | ICD-10-CM | POA: Diagnosis not present

## 2015-12-01 ENCOUNTER — Other Ambulatory Visit (HOSPITAL_COMMUNITY): Payer: Self-pay | Admitting: Internal Medicine

## 2015-12-01 ENCOUNTER — Ambulatory Visit (HOSPITAL_COMMUNITY)
Admission: RE | Admit: 2015-12-01 | Discharge: 2015-12-01 | Disposition: A | Discharge status: Home / Self Care | Payer: Medicare Other | Source: Ambulatory Visit | Attending: Internal Medicine | Admitting: Internal Medicine

## 2015-12-01 DIAGNOSIS — J690 Pneumonitis due to inhalation of food and vomit: Secondary | ICD-10-CM | POA: Diagnosis not present

## 2015-12-01 DIAGNOSIS — J189 Pneumonia, unspecified organism: Secondary | ICD-10-CM | POA: Insufficient documentation

## 2015-12-01 DIAGNOSIS — R918 Other nonspecific abnormal finding of lung field: Secondary | ICD-10-CM | POA: Diagnosis not present

## 2015-12-03 DIAGNOSIS — K120 Recurrent oral aphthae: Secondary | ICD-10-CM | POA: Diagnosis not present

## 2015-12-03 DIAGNOSIS — E1142 Type 2 diabetes mellitus with diabetic polyneuropathy: Secondary | ICD-10-CM | POA: Diagnosis not present

## 2015-12-03 DIAGNOSIS — I10 Essential (primary) hypertension: Secondary | ICD-10-CM | POA: Diagnosis not present

## 2015-12-03 DIAGNOSIS — M1710 Unilateral primary osteoarthritis, unspecified knee: Secondary | ICD-10-CM | POA: Diagnosis not present

## 2015-12-09 DIAGNOSIS — I10 Essential (primary) hypertension: Secondary | ICD-10-CM | POA: Diagnosis not present

## 2015-12-09 DIAGNOSIS — Z789 Other specified health status: Secondary | ICD-10-CM | POA: Diagnosis not present

## 2015-12-09 DIAGNOSIS — J189 Pneumonia, unspecified organism: Secondary | ICD-10-CM | POA: Diagnosis not present

## 2015-12-17 ENCOUNTER — Encounter (HOSPITAL_COMMUNITY)
Admission: RE | Admit: 2015-12-17 | Discharge: 2015-12-17 | Disposition: A | Discharge status: Home / Self Care | Payer: Medicare Other | Source: Ambulatory Visit | Attending: Orthopedic Surgery | Admitting: Orthopedic Surgery

## 2015-12-17 ENCOUNTER — Encounter (HOSPITAL_COMMUNITY): Payer: Self-pay

## 2015-12-17 ENCOUNTER — Ambulatory Visit (HOSPITAL_COMMUNITY)
Admission: RE | Admit: 2015-12-17 | Discharge: 2015-12-17 | Disposition: A | Discharge status: Home / Self Care | Payer: Medicare Other | Source: Ambulatory Visit | Attending: Physician Assistant | Admitting: Physician Assistant

## 2015-12-17 DIAGNOSIS — M170 Bilateral primary osteoarthritis of knee: Secondary | ICD-10-CM | POA: Diagnosis not present

## 2015-12-17 DIAGNOSIS — J189 Pneumonia, unspecified organism: Secondary | ICD-10-CM | POA: Diagnosis not present

## 2015-12-17 DIAGNOSIS — Z01812 Encounter for preprocedural laboratory examination: Secondary | ICD-10-CM | POA: Insufficient documentation

## 2015-12-17 DIAGNOSIS — Z01818 Encounter for other preprocedural examination: Secondary | ICD-10-CM | POA: Diagnosis not present

## 2015-12-17 HISTORY — DX: Pneumonia, unspecified organism: J18.9

## 2015-12-17 HISTORY — DX: Cramp and spasm: R25.2

## 2015-12-17 LAB — COMPREHENSIVE METABOLIC PANEL
ALT: 23 U/L (ref 14–54)
AST: 30 U/L (ref 15–41)
Albumin: 3.9 g/dL (ref 3.5–5.0)
Alkaline Phosphatase: 106 U/L (ref 38–126)
Anion gap: 11 (ref 5–15)
BILIRUBIN TOTAL: 0.9 mg/dL (ref 0.3–1.2)
BUN: 18 mg/dL (ref 6–20)
CO2: 23 mmol/L (ref 22–32)
CREATININE: 1.26 mg/dL — AB (ref 0.44–1.00)
Calcium: 9.4 mg/dL (ref 8.9–10.3)
Chloride: 106 mmol/L (ref 101–111)
GFR calc Af Amer: 46 mL/min — ABNORMAL LOW (ref >60)
GFR, EST NON AFRICAN AMERICAN: 40 mL/min — AB (ref >60)
Glucose, Bld: 86 mg/dL (ref 65–99)
POTASSIUM: 3.5 mmol/L (ref 3.5–5.1)
Sodium: 140 mmol/L (ref 135–145)
TOTAL PROTEIN: 6.9 g/dL (ref 6.5–8.1)

## 2015-12-17 LAB — SURGICAL PCR SCREEN
MRSA, PCR: NEGATIVE
Staphylococcus aureus: NEGATIVE

## 2015-12-17 LAB — CBC WITH DIFFERENTIAL/PLATELET
BASOS ABS: 0.1 10*3/uL (ref 0.0–0.1)
Basophils Relative: 1 %
Eosinophils Absolute: 0.1 10*3/uL (ref 0.0–0.7)
Eosinophils Relative: 1 %
HEMATOCRIT: 36.5 % (ref 36.0–46.0)
Hemoglobin: 11.9 g/dL — ABNORMAL LOW (ref 12.0–15.0)
LYMPHS ABS: 1.4 10*3/uL (ref 0.7–4.0)
LYMPHS PCT: 24 %
MCH: 29.1 pg (ref 26.0–34.0)
MCHC: 32.6 g/dL (ref 30.0–36.0)
MCV: 89.2 fL (ref 78.0–100.0)
MONO ABS: 0.4 10*3/uL (ref 0.1–1.0)
MONOS PCT: 7 %
NEUTROS ABS: 3.9 10*3/uL (ref 1.7–7.7)
Neutrophils Relative %: 67 %
Platelets: 152 10*3/uL (ref 150–400)
RBC: 4.09 MIL/uL (ref 3.87–5.11)
RDW: 13.1 % (ref 11.5–15.5)
WBC: 5.8 10*3/uL (ref 4.0–10.5)

## 2015-12-17 LAB — APTT: aPTT: 28 seconds (ref 24–37)

## 2015-12-17 LAB — PROTIME-INR
INR: 1.11 (ref 0.00–1.49)
PROTHROMBIN TIME: 14.5 s (ref 11.6–15.2)

## 2015-12-17 LAB — GLUCOSE, CAPILLARY: Glucose-Capillary: 149 mg/dL — ABNORMAL HIGH (ref 65–99)

## 2015-12-17 NOTE — Pre-Procedure Instructions (Signed)
[name]  12/17/2015      [facility] - [place], [place] - [place] [address] Phone: [phone] Fax: [phone]    Your procedure is scheduled on Monday, June 19.   Report to [facility] Admitting at 1040 A.M.   Call this number if you have problems the morning of surgery:  [phone]   Remember:  Do not eat food or drink liquids after midnight.  Take these medicines the morning of surgery with A SIP OF WATER metoprolol (lopressor)  Stop taking aspirin, Ibuprofen, Advil, Motrin, Aleve, BC's, Goody's, Herbal medication, Fish oil    Do not wear jewelry, make-up or nail polish.  Do not wear lotions, powders, or perfumes.  You may wear deodorant.  Do not shave 48 hours prior to surgery.  Men may shave face and neck.  Do not bring valuables to the hospital.  [facility] is not responsible for any belongings or valuables.  Contacts, dentures or bridgework may not be worn into surgery.  Leave your suitcase in the car.  After surgery it may be brought to your room.  For patients admitted to the hospital, discharge time will be determined by your treatment team.  Patients discharged the day of surgery will not be allowed to drive home.     How to Manage Your Diabetes Before and After Surgery  Why is it important to control my blood sugar before and after surgery? . Improving blood sugar levels before and after surgery helps healing and can limit problems. . A way of improving blood sugar control is eating a healthy diet by: o  Eating less sugar and carbohydrates o  Increasing activity/exercise o  Talking with your doctor about reaching your blood sugar goals . High blood sugars (greater than 180 mg/dL) can raise your risk of infections and slow your recovery, so you will need to focus on controlling your diabetes during the weeks before surgery. . Make sure that the doctor who takes care of your diabetes knows about your  planned surgery including the date and location.  How do I manage my blood sugar before surgery? . Check your blood sugar at least 4 times a day, starting 2 days before surgery, to make sure that the level is not too high or low. o Check your blood sugar the morning of your surgery when you wake up and every 2 hours until you get to the Short Stay unit. . If your blood sugar is less than 70 mg/dL, you will need to treat for low blood sugar: o Do not take insulin. o Treat a low blood sugar (less than 70 mg/dL) with  cup of clear juice (cranberry or apple), 4 glucose tablets, OR glucose gel. o Recheck blood sugar in 15 minutes after treatment (to make sure it is greater than 70 mg/dL). If your blood sugar is not greater than 70 mg/dL on recheck, call [phone] for further instructions. . Report your blood sugar to the short stay nurse when you get to Short Stay.  . If you are admitted to the hospital after surgery: o Your blood sugar will be checked by the staff and you will probably be given insulin after surgery (instead of oral diabetes medicines) to make sure you have good blood sugar levels. o The goal for blood sugar control after surgery is 80-180 mg/dL.              WHAT DO  I DO ABOUT MY DIABETES MEDICATION?   Marland Kitchen Do not take oral diabetes medicines (pills) the morning of surgery metformin (glucophage), glipizide (glucotrol)  . The day of surgery, do not take other diabetes injectables, including Byetta (exenatide), Bydureon (exenatide ER), Victoza (liraglutide), or Trulicity (dulaglutide).  . If your CBG is greater than 220 mg/dL, you may take  of your sliding scale (correction) dose of insulin.  Other Instructions:          Patient Signature:  Date:   Nurse Signature:  Date:   Reviewed and Endorsed by [facility] Patient Education Committee, August 2015 Special instructions:  [facility] - Preparing for Surgery  Before surgery, you can play an important  role.  Because skin is not sterile, your skin needs to be as free of germs as possible.  You can reduce the number of germs on you skin by washing with CHG (chlorahexidine gluconate) soap before surgery.  CHG is an antiseptic cleaner which kills germs and bonds with the skin to continue killing germs even after washing.  Please DO NOT use if you have an allergy to CHG or antibacterial soaps.  If your skin becomes reddened/irritated stop using the CHG and inform your nurse when you arrive at Short Stay.  Do not shave (including legs and underarms) for at least 48 hours prior to the first CHG shower.  You may shave your face.  Please follow these instructions carefully:   1.  Shower with CHG Soap the night before surgery and the    morning of Surgery.  2.  If you choose to wash your hair, wash your hair first as usual with your  normal shampoo.  3.  After you shampoo, rinse your hair and body thoroughly to remove the  Shampoo.  4.  Use CHG as you would any other liquid soap.  You can apply chg directly  to the skin and wash gently with scrungie or a clean washcloth.  5.  Apply the CHG Soap to your body ONLY FROM THE NECK DOWN.    Do not use on open wounds or open sores.  Avoid contact with your eyes, ears, mouth and genitals (private parts).  Wash genitals (private parts)       with your normal soap.  6.  Wash thoroughly, paying special attention to the area where your surgery  will be performed.  7.  Thoroughly rinse your body with warm water from the neck down.  8.  DO NOT shower/wash with your normal soap after using and rinsing off the CHG Soap.  9.  Pat yourself dry with a clean towel.            10.  Wear clean pajamas.            11.  Place clean sheets on your bed the night of your first shower and do not sleep with pets.  Day of Surgery  Do not apply any lotions/deoderants the morning of surgery.  Please wear clean clothes to the hospital/surgery center.     Please read over the  following fact sheets that you were given. Pain Booklet, Coughing and Deep Breathing, Total Joint Packet, MRSA Information and Surgical Site Infection Prevention

## 2015-12-17 NOTE — Progress Notes (Signed)
PCP: Dr. Woody Seller Cardiologist: Dr. Bronson Ing  EKG 10/28/15 Chest X ray 12/01/15  Last Hgb A1c 6.5, CBG 149 today. Pt states fasting CBG typically 80-120s. Pt educated to monitor MD and notify if consistently above 200s prior to surgery.   Pt with no cough, fever, chest pain or shortness of breath.   Spoke with PA at Dr. Archie Endo office who stated Pt did not show up for appointment yesterday. Instructed me to notify patient to see MD after pre-admit appointment. Pt states she will go to see Dr. Noemi Chapel today.   Pt instructed to stop diflocenac tablets and pt states that she will, but last time she did this she had to go to the hospital for stiff joints. Pt states she will talk to Dr. Noemi Chapel about this today.

## 2015-12-17 NOTE — H&P (Signed)
TOTAL KNEE ADMISSION H&P  Patient is being admitted for right total knee arthroplasty.  Subjective:  Chief Complaint:right knee pain.  HPI: [name], 81 y.o. female, has a history of pain and functional disability in the right knee due to arthritis and has failed non-surgical conservative treatments for greater than 12 weeks to includeNSAID's and/or analgesics, corticosteriod injections, viscosupplementation injections, flexibility and strengthening excercises, supervised PT with diminished ADL's post treatment, use of assistive devices, weight reduction as appropriate and activity modification.  Onset of symptoms was gradual, starting 10 years ago with gradually worsening course since that time. The patient noted no past surgery on the right knee(s).  Patient currently rates pain in the right knee(s) at 10 out of 10 with activity. Patient has night pain, worsening of pain with activity and weight bearing, pain that interferes with activities of daily living, crepitus and joint swelling.  Patient has evidence of subchondral sclerosis, periarticular osteophytes and joint space narrowing by imaging studies. There is no active infection.  Patient Active Problem List   Diagnosis Date Noted  . Primary localized osteoarthritis of right knee   . Diabetes ([place])   . HTN (hypertension)   . Murmur, cardiac   . TIA (transient ischemic attack)   . AI (aortic insufficiency) 12/26/2014  . H/O cardiovascular stress test 03/05/2009   Past Medical History  Diagnosis Date  . HTN (hypertension)   . Murmur, cardiac     echo 02/25/13- EF 60-65%, impaired relaxation-grade 1 diastolic dysfunction, mild concentric lvh, mild to moderate aortic regurgitation  . H/O cardiovascular stress test 03/05/2009    normal, no ischemia  . TIA (transient ischemic attack)     carotid doppler 05/08/11-normal patency  . H/O Doppler ultrasound     lower ext arterial doppler 04/13/09-no evidence of arterial insufficiency,  normal values; distal aorta 2.4x2.7  . Primary localized osteoarthritis of right knee   . Cramps, extremity     legs and hands  . Pneumonia     May 2017  . Diabetes ([place])     type II    Past Surgical History  Procedure Laterality Date  . Abdominal hysterectomy    . Bunionectomy Left   . Colonoscopy      No current facility-administered medications for this encounter.  Current outpatient prescriptions:  .  aspirin 81 MG tablet, Take 81 mg by mouth daily., Disp: , Rfl:  .  cyclobenzaprine (FLEXERIL) 10 MG tablet, Take 5-10 mg by mouth 3 (three) times daily as needed for muscle spasms., Disp: , Rfl:  .  diclofenac (VOLTAREN) 75 MG EC tablet, Take 75 mg by mouth 2 (two) times daily., Disp: , Rfl:  .  folic acid (FOLVITE) 1 MG tablet, Take 1 mg by mouth daily., Disp: , Rfl:  .  glipiZIDE (GLUCOTROL XL) 5 MG 24 hr tablet, Take 5 mg by mouth daily., Disp: , Rfl:  .  metFORMIN (GLUCOPHAGE) 500 MG tablet, Take 500 mg by mouth daily with breakfast. , Disp: , Rfl:  .  metoprolol (LOPRESSOR) 50 MG tablet, Take 50 mg by mouth 2 (two) times daily., Disp: , Rfl:  .  valsartan-hydrochlorothiazide (DIOVAN-HCT) 320-12.5 MG per tablet, Take 1 tablet by mouth daily. , Disp: , Rfl:  .  VICTOZA 18 MG/3ML SOPN, Inject 3.6 mg into the skin daily as needed (sugar >120). , Disp: , Rfl:      Allergies  Allergen Reactions  . Codeine Nausea Only  . Milk-Related Compounds Diarrhea    Social History  Substance Use Topics  . Smoking status: Never Smoker   . Smokeless tobacco: Never Used  . Alcohol Use: No    Family History  Problem Relation Age of Onset  . Stroke Mother   . Cancer Father   . Diabetes Brother   . Diabetes Brother   . Diabetes Brother   . Cancer Sister      Review of Systems  Constitutional: Negative.   HENT: Negative.   Eyes: Negative.   Respiratory: Negative.   Cardiovascular: Negative.   Gastrointestinal: Negative.   Genitourinary: Negative.   Musculoskeletal: Positive  for back pain and joint pain.  Skin: Negative.   Neurological: Negative.   Endo/Heme/Allergies: Negative.   Psychiatric/Behavioral: Negative.     Objective:  Physical Exam  Constitutional: She is oriented to person, place, and time. She appears well-developed and well-nourished.  HENT:  Head: Normocephalic and atraumatic.  Mouth/Throat: Oropharynx is clear and moist.  Eyes: Conjunctivae are normal. Pupils are equal, round, and reactive to light.  Neck: Neck supple.  Cardiovascular: Normal rate and regular rhythm.   Respiratory: Effort normal.  GI: Soft. Bowel sounds are normal.  Genitourinary:  Not pertinent to current symptomatology therefore not examined.  Musculoskeletal:  Examination of her right knee reveals moderate valgus deformity.  1+ synovitis.  Diffuse pain.  Range of motion -5 to 125 degrees.  Knee is stable with normal patella tracking.  Examination of her left knee reveals full range of motion with 1+ crepitus, 1+synovitis, moderate pain. Vascular exam: Pulses are 2+ and symmetric.    Neurological: She is alert and oriented to person, place, and time.  Skin: Skin is warm and dry.  Psychiatric: She has a normal mood and affect. Her behavior is normal.    Vital signs in last 24 hours: Temp:  [97 F (36.1 C)] 97 F (36.1 C) (06/08 1238) Pulse Rate:  [58] 58 (06/08 1238) Resp:  [18] 18 (06/08 1238) BP: (155)/(82) 155/82 mmHg (06/08 1238) SpO2:  [98 %] 98 % (06/08 1238) Weight:  [82.101 kg (181 lb)] 82.101 kg (181 lb) (06/08 1238)  Labs:   Estimated body mass index is 29.60 kg/(m^2) as calculated from the following:   Height as of 11/05/15: 5\' 5"  (1.651 m).   Weight as of 11/05/15: 80.695 kg (177 lb 14.4 oz).   Imaging Review Plain radiographs demonstrate severe degenerative joint disease of the right knee(s). The overall alignment issignificant valgus. The bone quality appears to be good for age and reported activity level.  Assessment/Plan:  End stage  arthritis, right knee  Principal Problem:   Primary localized osteoarthritis of right knee Active Problems:   AI (aortic insufficiency)   Diabetes (H[facility])   HTN (hypertension)   Murmur, cardiac   TIA (transient ischemic attack)   The patient history, physical examination, clinical judgment of the provider and imaging studies are consistent with end stage degenerative joint disease of the right knee(s) and total knee arthroplasty is deemed medically necessary. The treatment options including medical management, injection therapy arthroscopy and arthroplasty were discussed at length. The risks and benefits of total knee arthroplasty were presented and reviewed. The risks due to aseptic loosening, infection, stiffness, patella tracking problems, thromboembolic complications and other imponderables were discussed. The patient acknowledged the explanation, agreed to proceed with the plan and consent was signed. Patient is being admitted for inpatient treatment for surgery, pain control, PT, OT, prophylactic antibiotics, VTE prophylaxis, progressive ambulation and ADL's and discharge planning. The patient is planning to be discharged  home with home health services

## 2015-12-18 ENCOUNTER — Encounter (HOSPITAL_COMMUNITY): Payer: Self-pay | Admitting: Certified Registered Nurse Anesthetist

## 2015-12-18 ENCOUNTER — Encounter (HOSPITAL_COMMUNITY): Payer: Self-pay | Admitting: Emergency Medicine

## 2015-12-18 DIAGNOSIS — I10 Essential (primary) hypertension: Secondary | ICD-10-CM | POA: Diagnosis not present

## 2015-12-18 DIAGNOSIS — E119 Type 2 diabetes mellitus without complications: Secondary | ICD-10-CM | POA: Diagnosis not present

## 2015-12-18 LAB — TYPE AND SCREEN
ABO/RH(D): A POS
ANTIBODY SCREEN: NEGATIVE

## 2015-12-18 LAB — URINE CULTURE

## 2015-12-18 NOTE — Progress Notes (Signed)
Anesthesia Chart Review:  Pt is a 81 year old female scheduled for R total knee arthroplasty on 12/28/15 with Dr. Noemi Chapel.   Surgery was originally scheduled for 11/16/15 but was cancelled when pt developed pneumonia.   History includes non-smoker, DM2, HTN, murmur with mild AR, TIA '12, hysterectomy. PCP is listed as Dr. Jerene Bears.  She saw cardiologist Dr. Bronson Ing on 10/28/15. He wrote, "1. Preoperative risk stratification: The patient can proceed with right knee replacement surgery with an acceptable level of risk. She has normal left ventricular systolic function. No stress testing is warranted at this time." PRN cardiology follow-up recommended.  Meds include ASA 81 mg, glipizide, metformin, metoprolol, Diovan-HCT, Victoza.  10/28/15 EKG: SB at 52 bpm, low voltage in precordial leads, voltage criteria for LVH, poor R wave progression, may be secondary to LVH, consider old anterior infarct. Non-specific ST depression, non-diagnostic T wave abnormality.  10/29/14 Echo: Impressions: - Normal LV wall thickness and chamber size with LVEF 55-60% and grade 1 diastolic dysfunction. Mild left atrial enlargement. Mildly calcified aortic valve with mild aortic regurgitation.  05/06/11 Carotid U/S: Normal carotid Doppler evaluation.  03/05/09 Nuclear stress test: Normal myocardial perfusion study. Post stress EF 60%. No significant wall motion abnormalities. Low risk scan.  Preoperative labs noted. Cr 1.26. CBC, PT/PTT WNL. A1c 6.5 11/05/15. T&S done. Urine culture showed: MULTIPLE SPEC.IES PRESENT, SUGGEST RECOLLECTION.  If no acute changes then I would anticipate that she could proceed as planned.  Willeen Cass, FNP-BC [facility] Short Stay Surgical Center/Anesthesiology Phone: [phone] 12/18/2015 3:19 PM

## 2015-12-27 MED ORDER — CEFAZOLIN SODIUM-DEXTROSE 2-4 GM/100ML-% IV SOLN: 2.0000 g | INTRAVENOUS | Status: DC

## 2015-12-28 ENCOUNTER — Inpatient Hospital Stay (HOSPITAL_COMMUNITY): Admission: RE | Admit: 2015-12-28 | Payer: Medicare Other | Source: Ambulatory Visit | Admitting: Orthopedic Surgery

## 2015-12-28 ENCOUNTER — Encounter (HOSPITAL_COMMUNITY): Admission: RE | Payer: Self-pay | Source: Ambulatory Visit

## 2015-12-28 DIAGNOSIS — R112 Nausea with vomiting, unspecified: Secondary | ICD-10-CM | POA: Diagnosis not present

## 2015-12-28 SURGERY — ARTHROPLASTY, KNEE, TOTAL
Anesthesia: General | Laterality: Right

## 2016-01-14 DIAGNOSIS — M170 Bilateral primary osteoarthritis of knee: Secondary | ICD-10-CM | POA: Diagnosis not present

## 2016-01-14 NOTE — H&P (Signed)
TOTAL KNEE ADMISSION H&P  Patient is being admitted for right total knee arthroplasty.  Subjective:  Chief Complaint:right knee pain.  HPI: [name], 81 y.o. female, has a history of pain and functional disability in the right knee due to arthritis and has failed non-surgical conservative treatments for greater than 12 weeks to includeNSAID's and/or analgesics, corticosteriod injections, viscosupplementation injections, flexibility and strengthening excercises, supervised PT with diminished ADL's post treatment, use of assistive devices, weight reduction as appropriate and activity modification.  Onset of symptoms was gradual, starting 10 years ago with gradually worsening course since that time. The patient noted no past surgery on the right knee(s).  Patient currently rates pain in the right knee(s) at 10 out of 10 with activity. Patient has night pain, worsening of pain with activity and weight bearing, pain that interferes with activities of daily living, crepitus and joint swelling.  Patient has evidence of subchondral sclerosis, periarticular osteophytes and joint space narrowing by imaging studies. There is no active infection.  Patient Active Problem List   Diagnosis Date Noted  . Primary localized osteoarthritis of right knee   . Diabetes ([place])   . HTN (hypertension)   . Murmur, cardiac   . TIA (transient ischemic attack)   . AI (aortic insufficiency) 12/26/2014  . H/O cardiovascular stress test 03/05/2009   Past Medical History  Diagnosis Date  . HTN (hypertension)   . Murmur, cardiac     echo 02/25/13- EF 60-65%, impaired relaxation-grade 1 diastolic dysfunction, mild concentric lvh, mild to moderate aortic regurgitation  . H/O cardiovascular stress test 03/05/2009    normal, no ischemia  . TIA (transient ischemic attack)     carotid doppler 05/08/11-normal patency  . H/O Doppler ultrasound     lower ext arterial doppler 04/13/09-no evidence of arterial insufficiency,  normal values; distal aorta 2.4x2.7  . Primary localized osteoarthritis of right knee   . Cramps, extremity     legs and hands  . Pneumonia     May 2017  . Diabetes ([place])     type II    Past Surgical History  Procedure Laterality Date  . Abdominal hysterectomy    . Bunionectomy Left   . Colonoscopy      No current facility-administered medications for this encounter.  Current outpatient prescriptions:  .  aspirin 81 MG tablet, Take 81 mg by mouth daily., Disp: , Rfl:  .  cyclobenzaprine (FLEXERIL) 10 MG tablet, Take 5-10 mg by mouth 3 (three) times daily as needed for muscle spasms., Disp: , Rfl:  .  diclofenac (VOLTAREN) 75 MG EC tablet, Take 75 mg by mouth 2 (two) times daily., Disp: , Rfl:  .  folic acid (FOLVITE) 1 MG tablet, Take 1 mg by mouth daily., Disp: , Rfl:  .  glipiZIDE (GLUCOTROL XL) 5 MG 24 hr tablet, Take 5 mg by mouth daily., Disp: , Rfl:  .  metFORMIN (GLUCOPHAGE) 500 MG tablet, Take 500 mg by mouth daily with breakfast. , Disp: , Rfl:  .  metoprolol (LOPRESSOR) 50 MG tablet, Take 50 mg by mouth 2 (two) times daily., Disp: , Rfl:  .  valsartan-hydrochlorothiazide (DIOVAN-HCT) 320-12.5 MG per tablet, Take 1 tablet by mouth daily. , Disp: , Rfl:  .  VICTOZA 18 MG/3ML SOPN, Inject 3.6 mg into the skin daily as needed (sugar >120). , Disp: , Rfl:      Allergies  Allergen Reactions  . Codeine Nausea Only  . Milk-Related Compounds Diarrhea    Social History  Substance Use Topics  . Smoking status: Never Smoker   . Smokeless tobacco: Never Used  . Alcohol Use: No    Family History  Problem Relation Age of Onset  . Stroke Mother   . Cancer Father   . Diabetes Brother   . Diabetes Brother   . Diabetes Brother   . Cancer Sister      Review of Systems  Constitutional: Negative.   HENT: Negative.   Eyes: Negative.   Respiratory: Negative.   Cardiovascular: Negative.   Gastrointestinal: Negative.   Genitourinary: Negative.   Musculoskeletal: Positive  for back pain and joint pain.  Skin: Negative.   Neurological: Negative.   Endo/Heme/Allergies: Negative.     Objective:  Physical Exam  Constitutional: She is oriented to person, place, and time. She appears well-developed and well-nourished.  HENT:  Head: Normocephalic and atraumatic.  Mouth/Throat: Oropharynx is clear and moist.  Eyes: Conjunctivae are normal. Pupils are equal, round, and reactive to light.  Cardiovascular: Normal rate and regular rhythm.   Respiratory: Effort normal and breath sounds normal.  GI: Soft.  Genitourinary:  Not pertinent to current symptomatology therefore not examined.  Musculoskeletal:  Evaluation of her right knee shows significant valgus deformity.  2+ crepitus.  2+ synovitis.  Range of motion -5 to 120 degrees.  Distal neurovascular exam is intact. Left knee has a mild amount of pain today with 1+ crepitus.  1+ synovitis.    Neurological: She is alert and oriented to person, place, and time.  Skin: Skin is warm and dry.  Psychiatric: She has a normal mood and affect. Her behavior is normal.    Vital signs in last 24 hours: Temp:  [98.2 F (36.8 C)] 98.2 F (36.8 C) (07/06 1500) Pulse Rate:  [72] 72 (07/06 1500) BP: (127)/(79) 127/79 mmHg (07/06 1500) SpO2:  [96 %] 96 % (07/06 1500) Weight:  [82.555 kg (182 lb)] 82.555 kg (182 lb) (07/06 1500)  Labs:   Estimated body mass index is 29.39 kg/(m^2) as calculated from the following:   Height as of this encounter: 5\' 6"  (1.676 m).   Weight as of this encounter: 82.555 kg (182 lb).   Imaging Review Plain radiographs demonstrate severe degenerative joint disease of the right knee(s). The overall alignment issignificant valgus. The bone quality appears to be good for age and reported activity level.  Assessment/Plan:  End stage arthritis, right knee Principal Problem:   Primary localized osteoarthritis of right knee Active Problems:   AI (aortic insufficiency)   Diabetes ([facility])   HTN  (hypertension)   Murmur, cardiac   TIA (transient ischemic attack)    The patient history, physical examination, clinical judgment of the provider and imaging studies are consistent with end stage degenerative joint disease of the right knee(s) and total knee arthroplasty is deemed medically necessary. The treatment options including medical management, injection therapy arthroscopy and arthroplasty were discussed at length. The risks and benefits of total knee arthroplasty were presented and reviewed. The risks due to aseptic loosening, infection, stiffness, patella tracking problems, thromboembolic complications and other imponderables were discussed. The patient acknowledged the explanation, agreed to proceed with the plan and consent was signed. Patient is being admitted for inpatient treatment for surgery, pain control, PT, OT, prophylactic antibiotics, VTE prophylaxis, progressive ambulation and ADL's and discharge planning. The patient is planning to be discharged home with home health services

## 2016-01-15 ENCOUNTER — Other Ambulatory Visit (HOSPITAL_COMMUNITY): Payer: Medicare Other

## 2016-01-19 DIAGNOSIS — E1140 Type 2 diabetes mellitus with diabetic neuropathy, unspecified: Secondary | ICD-10-CM | POA: Diagnosis not present

## 2016-01-19 DIAGNOSIS — E1151 Type 2 diabetes mellitus with diabetic peripheral angiopathy without gangrene: Secondary | ICD-10-CM | POA: Diagnosis not present

## 2016-01-21 NOTE — Pre-Procedure Instructions (Signed)
[name]  01/21/2016     Your procedure is scheduled on : Monday February 01, 2016 at 12:40 PM.  Report to [facility] Admitting at 10:40 AM.  Call this number if you have problems the morning of surgery: [phone]      Remember:  Do not eat food or drink liquids after midnight.  Take these medicines the morning of surgery with A SIP OF WATER : Metoprolol (Lopressor)   Stop taking any vitamins, herbal medications/supplements, NSAIDs, Ibuprofen, Advil, Motrin, Aleve, Diclofenac/Voltaren, etc on Monday July 17th    Do NOT take any diabetic pills or Victoza the morning of your surgery (NO Glipizide/Glucotrol NO Metformin/Glucophage)    How to Manage Your Diabetes Before and After Surgery  Why is it important to control my blood sugar before and after surgery? . Improving blood sugar levels before and after surgery helps healing and can limit problems. . A way of improving blood sugar control is eating a healthy diet by: o  Eating less sugar and carbohydrates o  Increasing activity/exercise o  Talking with your doctor about reaching your blood sugar goals . High blood sugars (greater than 180 mg/dL) can raise your risk of infections and slow your recovery, so you will need to focus on controlling your diabetes during the weeks before surgery. . Make sure that the doctor who takes care of your diabetes knows about your planned surgery including the date and location.  How do I manage my blood sugar before surgery? . Check your blood sugar at least 4 times a day, starting 2 days before surgery, to make sure that the level is not too high or low. o Check your blood sugar the morning of your surgery when you wake up and every 2 hours until you get to the Short Stay unit. . If your blood sugar is less than 70 mg/dL, you will need to treat for low blood sugar: o Do not take insulin. o Treat a low blood sugar (less than 70 mg/dL) with  cup of clear juice (cranberry or  apple), 4 glucose tablets, OR glucose gel. o Recheck blood sugar in 15 minutes after treatment (to make sure it is greater than 70 mg/dL). If your blood sugar is not greater than 70 mg/dL on recheck, call [phone] for further instructions. . Report your blood sugar to the short stay nurse when you get to Short Stay.  . If you are admitted to the hospital after surgery: o Your blood sugar will be checked by the staff and you will probably be given insulin after surgery (instead of oral diabetes medicines) to make sure you have good blood sugar levels. o The goal for blood sugar control after surgery is 80-180 mg/dL.     WHAT DO I DO ABOUT MY DIABETES MEDICATION?  Marland Kitchen Do not take oral diabetes medicines (pills) the morning of surgery.  The day of surgery, do not take other diabetes injectables Victoza (liraglutide)  IReviewed and Endorsed by [facility] Patient Education Committee, August 2015   Do not wear jewelry, make-up or nail polish.  Do not wear lotions, powders, or perfumes.    Do not shave 48 hours prior to surgery.   Do not bring valuables to the hospital.  [facility] is not responsible for any belongings or valuables.  Contacts, dentures or bridgework may not be worn into surgery.  Leave your suitcase in the car.  After surgery it may be brought to your room.  For  patients admitted to the hospital, discharge time will be determined by your treatment team.  Patients discharged the day of surgery will not be allowed to drive home.   Name and phone number of your driver:    Special instructions:  Shower using CHG soap the night before and the morning of your surgery  Please read over the following fact sheets that you were given.

## 2016-01-22 ENCOUNTER — Encounter (HOSPITAL_COMMUNITY): Payer: Self-pay

## 2016-01-22 ENCOUNTER — Encounter (HOSPITAL_COMMUNITY)
Admission: RE | Admit: 2016-01-22 | Discharge: 2016-01-22 | Disposition: A | Discharge status: Home / Self Care | Payer: Medicare Other | Source: Ambulatory Visit | Attending: Orthopedic Surgery | Admitting: Orthopedic Surgery

## 2016-01-22 DIAGNOSIS — I351 Nonrheumatic aortic (valve) insufficiency: Secondary | ICD-10-CM | POA: Diagnosis not present

## 2016-01-22 DIAGNOSIS — R011 Cardiac murmur, unspecified: Secondary | ICD-10-CM | POA: Insufficient documentation

## 2016-01-22 DIAGNOSIS — Z01812 Encounter for preprocedural laboratory examination: Secondary | ICD-10-CM | POA: Diagnosis not present

## 2016-01-22 DIAGNOSIS — Z8673 Personal history of transient ischemic attack (TIA), and cerebral infarction without residual deficits: Secondary | ICD-10-CM | POA: Insufficient documentation

## 2016-01-22 DIAGNOSIS — I10 Essential (primary) hypertension: Secondary | ICD-10-CM | POA: Insufficient documentation

## 2016-01-22 DIAGNOSIS — E119 Type 2 diabetes mellitus without complications: Secondary | ICD-10-CM | POA: Diagnosis not present

## 2016-01-22 LAB — COMPREHENSIVE METABOLIC PANEL
ALT: 18 U/L (ref 14–54)
AST: 24 U/L (ref 15–41)
Albumin: 4.1 g/dL (ref 3.5–5.0)
Alkaline Phosphatase: 114 U/L (ref 38–126)
Anion gap: 9 (ref 5–15)
BUN: 25 mg/dL — ABNORMAL HIGH (ref 6–20)
CHLORIDE: 108 mmol/L (ref 101–111)
CO2: 22 mmol/L (ref 22–32)
CREATININE: 1.32 mg/dL — AB (ref 0.44–1.00)
Calcium: 9.9 mg/dL (ref 8.9–10.3)
GFR, EST AFRICAN AMERICAN: 44 mL/min — AB (ref >60)
GFR, EST NON AFRICAN AMERICAN: 38 mL/min — AB (ref >60)
Glucose, Bld: 88 mg/dL (ref 65–99)
Potassium: 4 mmol/L (ref 3.5–5.1)
Sodium: 139 mmol/L (ref 135–145)
Total Bilirubin: 0.9 mg/dL (ref 0.3–1.2)
Total Protein: 7.3 g/dL (ref 6.5–8.1)

## 2016-01-22 LAB — APTT: aPTT: 29 seconds (ref 24–37)

## 2016-01-22 LAB — CBC WITH DIFFERENTIAL/PLATELET
BASOS PCT: 1 %
Basophils Absolute: 0.1 10*3/uL (ref 0.0–0.1)
EOS ABS: 0.1 10*3/uL (ref 0.0–0.7)
EOS PCT: 1 %
HCT: 38.4 % (ref 36.0–46.0)
Hemoglobin: 12.7 g/dL (ref 12.0–15.0)
LYMPHS ABS: 1.4 10*3/uL (ref 0.7–4.0)
Lymphocytes Relative: 21 %
MCH: 29.6 pg (ref 26.0–34.0)
MCHC: 33.1 g/dL (ref 30.0–36.0)
MCV: 89.5 fL (ref 78.0–100.0)
Monocytes Absolute: 0.6 10*3/uL (ref 0.1–1.0)
Monocytes Relative: 9 %
NEUTROS PCT: 68 %
Neutro Abs: 4.4 10*3/uL (ref 1.7–7.7)
PLATELETS: 178 10*3/uL (ref 150–400)
RBC: 4.29 MIL/uL (ref 3.87–5.11)
RDW: 13 % (ref 11.5–15.5)
WBC: 6.6 10*3/uL (ref 4.0–10.5)

## 2016-01-22 LAB — PROTIME-INR
INR: 1.14 (ref 0.00–1.49)
PROTHROMBIN TIME: 14.8 s (ref 11.6–15.2)

## 2016-01-22 LAB — GLUCOSE, CAPILLARY: Glucose-Capillary: 96 mg/dL (ref 65–99)

## 2016-01-22 LAB — SURGICAL PCR SCREEN
MRSA, PCR: NEGATIVE
STAPHYLOCOCCUS AUREUS: NEGATIVE

## 2016-01-22 NOTE — Progress Notes (Signed)
PCP is Dhruv Economist is [name]  Patient denied having any acute cardiac or pulmonary issues, but did inform Nurse that she had pneumonia back in May of 2017 and her knee surgery was canceled.  CBG on arrival to PAT was 96 and patient informed Nurse that she had not consumed any food today. Patient stated her blood glucose typically ranges from 90-145.

## 2016-01-23 LAB — HEMOGLOBIN A1C
HEMOGLOBIN A1C: 5.9 % — AB (ref 4.8–5.6)
MEAN PLASMA GLUCOSE: 123 mg/dL

## 2016-01-24 LAB — URINE CULTURE

## 2016-01-26 DIAGNOSIS — M1710 Unilateral primary osteoarthritis, unspecified knee: Secondary | ICD-10-CM | POA: Diagnosis not present

## 2016-01-26 NOTE — Progress Notes (Signed)
Anesthesia Chart Review: Patient is a 81 year old female scheduled for right TKR on 02/01/16 by Dr. Noemi Chapel. Procedure was initially scheduled for 11/16/15 but rescheduled for 12/28/15 due to pneumonia. Surgery was again rescheduled for 02/01/16 for unknown reasons to me.   History includes non-smoker, DM2, HTN, murmur with mild AR, TIA '12, hysterectomy. PCP is listed as Dr. Jerene Bears.  She saw cardiologist Dr. Bronson Ing on 10/28/15. He wrote, "1. Preoperative risk stratification: The patient can proceed with right knee replacement surgery with an acceptable level of risk. She has normal left ventricular systolic function. No stress testing is warranted at this time." PRN cardiology follow-up recommended.  Meds include ASA 81 mg, Flexeril, folic acid, glipizide, metformin, metoprolol, Diovan-HCT, Victoza.  10/28/15 EKG: SB at 52 bpm, low voltage in precordial leads, voltage criteria for LVH, poor R wave progression, may be secondary to LVH, consider old anterior infarct. Non-specific ST depression, non-diagnostic T wave abnormality.  10/29/14 Echo: Impressions: - Normal LV wall thickness and chamber size with LVEF 55-60% and  grade 1 diastolic dysfunction. Mild left atrial enlargement.  Mildly calcified aortic valve with mild aortic regurgitation.  05/06/11 Carotid U/S: Normal carotid Doppler evaluation.  03/05/09 Nuclear stress test: Normal myocardial perfusion study. Post stress EF 60%. No significant wall motion abnormalities. Low risk scan.  Preoperative labs noted. Cr 1.32. CBC, PT/PTT WNL. A1c 5.9. T&S done. Urine culture showed > 100,000 Enterococcus species. Surgeon started her on ampicillin.  If no acute changes then I would anticipate that she could proceed as planned.  [facility] Short Stay Center/Anesthesiology Phone [phone] 01/26/2016 1:07 PM

## 2016-02-01 ENCOUNTER — Encounter (HOSPITAL_COMMUNITY): Payer: Self-pay | Admitting: Certified Registered Nurse Anesthetist

## 2016-02-01 ENCOUNTER — Encounter (HOSPITAL_COMMUNITY)
Admission: RE | Disposition: A | Discharge status: Home Health | Payer: Self-pay | Source: Ambulatory Visit | Attending: Orthopedic Surgery

## 2016-02-01 ENCOUNTER — Inpatient Hospital Stay (HOSPITAL_COMMUNITY): Payer: Medicare Other | Admitting: Vascular Surgery

## 2016-02-01 ENCOUNTER — Inpatient Hospital Stay (HOSPITAL_COMMUNITY): Payer: Medicare Other | Admitting: Certified Registered Nurse Anesthetist

## 2016-02-01 ENCOUNTER — Inpatient Hospital Stay (HOSPITAL_COMMUNITY)
Admission: RE | Admit: 2016-02-01 | Discharge: 2016-02-03 | DRG: 470 | Disposition: A | Discharge status: Home Health | Payer: Medicare Other | Source: Ambulatory Visit | Attending: Orthopedic Surgery | Admitting: Orthopedic Surgery

## 2016-02-01 DIAGNOSIS — N390 Urinary tract infection, site not specified: Secondary | ICD-10-CM | POA: Diagnosis present

## 2016-02-01 DIAGNOSIS — Z8673 Personal history of transient ischemic attack (TIA), and cerebral infarction without residual deficits: Secondary | ICD-10-CM | POA: Diagnosis not present

## 2016-02-01 DIAGNOSIS — E119 Type 2 diabetes mellitus without complications: Secondary | ICD-10-CM | POA: Diagnosis present

## 2016-02-01 DIAGNOSIS — Z7982 Long term (current) use of aspirin: Secondary | ICD-10-CM | POA: Diagnosis not present

## 2016-02-01 DIAGNOSIS — M1711 Unilateral primary osteoarthritis, right knee: Secondary | ICD-10-CM | POA: Diagnosis present

## 2016-02-01 DIAGNOSIS — Z79899 Other long term (current) drug therapy: Secondary | ICD-10-CM | POA: Diagnosis not present

## 2016-02-01 DIAGNOSIS — G8918 Other acute postprocedural pain: Secondary | ICD-10-CM | POA: Diagnosis not present

## 2016-02-01 DIAGNOSIS — R011 Cardiac murmur, unspecified: Secondary | ICD-10-CM | POA: Diagnosis present

## 2016-02-01 DIAGNOSIS — Z885 Allergy status to narcotic agent status: Secondary | ICD-10-CM

## 2016-02-01 DIAGNOSIS — Z833 Family history of diabetes mellitus: Secondary | ICD-10-CM

## 2016-02-01 DIAGNOSIS — I10 Essential (primary) hypertension: Secondary | ICD-10-CM | POA: Diagnosis present

## 2016-02-01 DIAGNOSIS — B952 Enterococcus as the cause of diseases classified elsewhere: Secondary | ICD-10-CM | POA: Diagnosis present

## 2016-02-01 DIAGNOSIS — I351 Nonrheumatic aortic (valve) insufficiency: Secondary | ICD-10-CM | POA: Diagnosis present

## 2016-02-01 DIAGNOSIS — M25561 Pain in right knee: Secondary | ICD-10-CM | POA: Diagnosis not present

## 2016-02-01 DIAGNOSIS — Z7984 Long term (current) use of oral hypoglycemic drugs: Secondary | ICD-10-CM | POA: Diagnosis not present

## 2016-02-01 DIAGNOSIS — D62 Acute posthemorrhagic anemia: Secondary | ICD-10-CM | POA: Diagnosis not present

## 2016-02-01 DIAGNOSIS — M179 Osteoarthritis of knee, unspecified: Secondary | ICD-10-CM | POA: Diagnosis not present

## 2016-02-01 DIAGNOSIS — Z91011 Allergy to milk products: Secondary | ICD-10-CM | POA: Diagnosis not present

## 2016-02-01 DIAGNOSIS — G459 Transient cerebral ischemic attack, unspecified: Secondary | ICD-10-CM | POA: Diagnosis present

## 2016-02-01 HISTORY — DX: Acute posthemorrhagic anemia: D62

## 2016-02-01 HISTORY — PX: TOTAL KNEE ARTHROPLASTY: SHX125

## 2016-02-01 HISTORY — DX: Enterococcus as the cause of diseases classified elsewhere: B95.2

## 2016-02-01 HISTORY — DX: Urinary tract infection, site not specified: N39.0

## 2016-02-01 LAB — GLUCOSE, CAPILLARY
GLUCOSE-CAPILLARY: 125 mg/dL — AB (ref 65–99)
GLUCOSE-CAPILLARY: 149 mg/dL — AB (ref 65–99)
Glucose-Capillary: 126 mg/dL — ABNORMAL HIGH (ref 65–99)

## 2016-02-01 SURGERY — ARTHROPLASTY, KNEE, TOTAL
Anesthesia: Spinal | Site: Knee | Laterality: Right

## 2016-02-01 MED ORDER — INSULIN GLARGINE 100 UNIT/ML ~~LOC~~ SOLN
25.0000 [IU] | Freq: Every day | SUBCUTANEOUS | Status: DC
  Administered 2016-02-02: 25 [IU] via SUBCUTANEOUS
  Filled 2016-02-01 (×3): qty 0.25

## 2016-02-01 MED ORDER — LIDOCAINE HCL (CARDIAC) 20 MG/ML IV SOLN
INTRAVENOUS | Status: DC | PRN
  Administered 2016-02-01: 40 mg via INTRATRACHEAL

## 2016-02-01 MED ORDER — INSULIN ASPART 100 UNIT/ML ~~LOC~~ SOLN
0.0000 [IU] | Freq: Three times a day (TID) | SUBCUTANEOUS | Status: DC
  Administered 2016-02-02 – 2016-02-03 (×3): 3 [IU] via SUBCUTANEOUS

## 2016-02-01 MED ORDER — MIDAZOLAM HCL 2 MG/2ML IJ SOLN
1.0000 mg | Freq: Once | INTRAMUSCULAR | Status: AC
  Administered 2016-02-01: 1 mg via INTRAVENOUS

## 2016-02-01 MED ORDER — HYDROMORPHONE HCL 2 MG PO TABS
2.0000 mg | ORAL_TABLET | ORAL | Status: DC | PRN
  Administered 2016-02-01 – 2016-02-03 (×7): 2 mg via ORAL
  Filled 2016-02-01 (×7): qty 1
  Filled 2016-02-01: qty 2

## 2016-02-01 MED ORDER — BUPIVACAINE-EPINEPHRINE 0.25% -1:200000 IJ SOLN
INTRAMUSCULAR | Status: DC | PRN
  Administered 2016-02-01: 30 mL

## 2016-02-01 MED ORDER — MIDAZOLAM HCL 5 MG/5ML IJ SOLN
INTRAMUSCULAR | Status: DC | PRN
  Administered 2016-02-01: 1 mg via INTRAVENOUS

## 2016-02-01 MED ORDER — PHENOL 1.4 % MT LIQD: 1.0000 | OROMUCOSAL | Status: DC | PRN

## 2016-02-01 MED ORDER — FENTANYL CITRATE (PF) 100 MCG/2ML IJ SOLN
50.0000 ug | Freq: Once | INTRAMUSCULAR | Status: AC
Start: 2016-02-01 — End: 2016-02-01
  Administered 2016-02-01: 50 ug via INTRAVENOUS

## 2016-02-01 MED ORDER — ONDANSETRON HCL 4 MG PO TABS: 4.0000 mg | ORAL_TABLET | Freq: Four times a day (QID) | ORAL | Status: DC | PRN

## 2016-02-01 MED ORDER — BUPIVACAINE HCL (PF) 0.75 % IJ SOLN
INTRAMUSCULAR | Status: DC | PRN
  Administered 2016-02-01: 1.6 mL via INTRATHECAL

## 2016-02-01 MED ORDER — POTASSIUM CHLORIDE IN NACL 20-0.9 MEQ/L-% IV SOLN
INTRAVENOUS | Status: DC
Start: 2016-02-01 — End: 2016-02-03
  Administered 2016-02-01 – 2016-02-02 (×2): via INTRAVENOUS
  Filled 2016-02-01 (×2): qty 1000

## 2016-02-01 MED ORDER — INSULIN ASPART 100 UNIT/ML ~~LOC~~ SOLN
4.0000 [IU] | Freq: Three times a day (TID) | SUBCUTANEOUS | Status: DC
  Administered 2016-02-02 – 2016-02-03 (×4): 4 [IU] via SUBCUTANEOUS

## 2016-02-01 MED ORDER — METOCLOPRAMIDE HCL 5 MG/ML IJ SOLN: 5.0000 mg | Freq: Three times a day (TID) | INTRAMUSCULAR | Status: DC | PRN

## 2016-02-01 MED ORDER — POLYETHYLENE GLYCOL 3350 17 G PO PACK
17.0000 g | PACK | Freq: Two times a day (BID) | ORAL | Status: DC
  Administered 2016-02-02 – 2016-02-03 (×3): 17 g via ORAL
  Filled 2016-02-01 (×4): qty 1

## 2016-02-01 MED ORDER — SODIUM CHLORIDE 0.9 % IR SOLN
Status: DC | PRN
  Administered 2016-02-01: 3000 mL
  Administered 2016-02-01: 1000 mL

## 2016-02-01 MED ORDER — CHLORHEXIDINE GLUCONATE 4 % EX LIQD: 60.0000 mL | Freq: Once | CUTANEOUS | Status: DC

## 2016-02-01 MED ORDER — DEXAMETHASONE SODIUM PHOSPHATE 10 MG/ML IJ SOLN
10.0000 mg | Freq: Three times a day (TID) | INTRAMUSCULAR | Status: AC
  Administered 2016-02-01 – 2016-02-02 (×4): 10 mg via INTRAVENOUS
  Filled 2016-02-01 (×4): qty 1

## 2016-02-01 MED ORDER — PROMETHAZINE HCL 25 MG/ML IJ SOLN: 6.2500 mg | INTRAMUSCULAR | Status: DC | PRN

## 2016-02-01 MED ORDER — CEFAZOLIN SODIUM-DEXTROSE 2-4 GM/100ML-% IV SOLN
2.0000 g | Freq: Four times a day (QID) | INTRAVENOUS | Status: AC
  Administered 2016-02-01 – 2016-02-02 (×2): 2 g via INTRAVENOUS
  Filled 2016-02-01 (×2): qty 100

## 2016-02-01 MED ORDER — INSULIN ASPART 100 UNIT/ML ~~LOC~~ SOLN
0.0000 [IU] | Freq: Every day | SUBCUTANEOUS | Status: DC
  Administered 2016-02-02: 2 [IU] via SUBCUTANEOUS

## 2016-02-01 MED ORDER — PHENYLEPHRINE HCL 10 MG/ML IJ SOLN
INTRAVENOUS | Status: DC | PRN
  Administered 2016-02-01: 20 ug/min via INTRAVENOUS

## 2016-02-01 MED ORDER — GLYCOPYRROLATE 0.2 MG/ML IJ SOLN
INTRAMUSCULAR | Status: DC | PRN
  Administered 2016-02-01 (×2): .2 mg via INTRAVENOUS

## 2016-02-01 MED ORDER — DEXAMETHASONE SODIUM PHOSPHATE 10 MG/ML IJ SOLN
INTRAMUSCULAR | Status: DC | PRN
  Administered 2016-02-01: 10 mg via INTRAVENOUS

## 2016-02-01 MED ORDER — METOPROLOL TARTRATE 50 MG PO TABS
50.0000 mg | ORAL_TABLET | Freq: Two times a day (BID) | ORAL | Status: DC
  Administered 2016-02-02 – 2016-02-03 (×3): 50 mg via ORAL
  Filled 2016-02-01 (×4): qty 1

## 2016-02-01 MED ORDER — ASPIRIN EC 325 MG PO TBEC
325.0000 mg | DELAYED_RELEASE_TABLET | Freq: Every day | ORAL | Status: DC
  Administered 2016-02-02 – 2016-02-03 (×2): 325 mg via ORAL
  Filled 2016-02-01 (×2): qty 1

## 2016-02-01 MED ORDER — POVIDONE-IODINE 7.5 % EX SOLN
Freq: Once | CUTANEOUS | Status: DC
  Filled 2016-02-01: qty 118

## 2016-02-01 MED ORDER — DIPHENHYDRAMINE HCL 12.5 MG/5ML PO ELIX: 12.5000 mg | ORAL_SOLUTION | ORAL | Status: DC | PRN

## 2016-02-01 MED ORDER — MORPHINE SULFATE (PF) 2 MG/ML IV SOLN
2.0000 mg | INTRAVENOUS | Status: DC | PRN
  Administered 2016-02-01 – 2016-02-02 (×2): 2 mg via INTRAVENOUS
  Filled 2016-02-01 (×2): qty 1

## 2016-02-01 MED ORDER — ACETAMINOPHEN 325 MG PO TABS: 650.0000 mg | ORAL_TABLET | Freq: Four times a day (QID) | ORAL | Status: DC | PRN

## 2016-02-01 MED ORDER — MIDAZOLAM HCL 2 MG/2ML IJ SOLN
INTRAMUSCULAR | Status: AC
  Administered 2016-02-01: 1 mg via INTRAVENOUS
  Filled 2016-02-01: qty 2

## 2016-02-01 MED ORDER — 0.9 % SODIUM CHLORIDE (POUR BTL) OPTIME
TOPICAL | Status: DC | PRN
  Administered 2016-02-01: 1000 mL

## 2016-02-01 MED ORDER — BUPIVACAINE-EPINEPHRINE (PF) 0.5% -1:200000 IJ SOLN
INTRAMUSCULAR | Status: DC | PRN
  Administered 2016-02-01: 30 mL via PERINEURAL

## 2016-02-01 MED ORDER — LACTATED RINGERS IV SOLN
INTRAVENOUS | Status: DC
  Administered 2016-02-01 (×2): via INTRAVENOUS

## 2016-02-01 MED ORDER — FENTANYL CITRATE (PF) 100 MCG/2ML IJ SOLN
INTRAMUSCULAR | Status: DC | PRN
  Administered 2016-02-01: 50 ug via INTRAVENOUS

## 2016-02-01 MED ORDER — PROPOFOL 500 MG/50ML IV EMUL
INTRAVENOUS | Status: DC | PRN
  Administered 2016-02-01: 60 ug/kg/min via INTRAVENOUS

## 2016-02-01 MED ORDER — ALUM & MAG HYDROXIDE-SIMETH 200-200-20 MG/5ML PO SUSP: 30.0000 mL | ORAL | Status: DC | PRN

## 2016-02-01 MED ORDER — MIDAZOLAM HCL 2 MG/2ML IJ SOLN
INTRAMUSCULAR | Status: AC
  Filled 2016-02-01: qty 2

## 2016-02-01 MED ORDER — DOCUSATE SODIUM 100 MG PO CAPS
100.0000 mg | ORAL_CAPSULE | Freq: Two times a day (BID) | ORAL | Status: DC
Start: 2016-02-01 — End: 2016-02-03
  Administered 2016-02-01 – 2016-02-03 (×4): 100 mg via ORAL
  Filled 2016-02-01 (×4): qty 1

## 2016-02-01 MED ORDER — HYDROMORPHONE HCL 1 MG/ML IJ SOLN: 0.2500 mg | INTRAMUSCULAR | Status: DC | PRN

## 2016-02-01 MED ORDER — PHENYLEPHRINE HCL 10 MG/ML IJ SOLN
INTRAMUSCULAR | Status: DC | PRN
  Administered 2016-02-01 (×3): 80 ug via INTRAVENOUS
  Administered 2016-02-01: 160 ug via INTRAVENOUS

## 2016-02-01 MED ORDER — MENTHOL 3 MG MT LOZG
1.0000 | LOZENGE | OROMUCOSAL | Status: DC | PRN
  Filled 2016-02-01: qty 9

## 2016-02-01 MED ORDER — CYCLOBENZAPRINE HCL 10 MG PO TABS
5.0000 mg | ORAL_TABLET | Freq: Three times a day (TID) | ORAL | Status: DC | PRN
  Administered 2016-02-02 – 2016-02-03 (×2): 10 mg via ORAL
  Filled 2016-02-01 (×2): qty 1

## 2016-02-01 MED ORDER — CELECOXIB 200 MG PO CAPS
200.0000 mg | ORAL_CAPSULE | Freq: Two times a day (BID) | ORAL | Status: DC
  Administered 2016-02-01 – 2016-02-03 (×4): 200 mg via ORAL
  Filled 2016-02-01 (×4): qty 1

## 2016-02-01 MED ORDER — HYDROCHLOROTHIAZIDE 12.5 MG PO CAPS: 12.5000 mg | ORAL_CAPSULE | Freq: Every day | ORAL | Status: DC

## 2016-02-01 MED ORDER — BUPIVACAINE-EPINEPHRINE (PF) 0.25% -1:200000 IJ SOLN
INTRAMUSCULAR | Status: AC
  Filled 2016-02-01: qty 30

## 2016-02-01 MED ORDER — HYDROCHLOROTHIAZIDE 12.5 MG PO CAPS
12.5000 mg | ORAL_CAPSULE | Freq: Every day | ORAL | Status: DC
  Administered 2016-02-01 – 2016-02-03 (×3): 12.5 mg via ORAL
  Filled 2016-02-01 (×3): qty 1

## 2016-02-01 MED ORDER — FOLIC ACID 1 MG PO TABS
1.0000 mg | ORAL_TABLET | Freq: Every day | ORAL | Status: DC
  Administered 2016-02-03: 1 mg via ORAL
  Filled 2016-02-01 (×2): qty 1

## 2016-02-01 MED ORDER — METOCLOPRAMIDE HCL 5 MG PO TABS: 5.0000 mg | ORAL_TABLET | Freq: Three times a day (TID) | ORAL | Status: DC | PRN

## 2016-02-01 MED ORDER — IRBESARTAN 300 MG PO TABS
300.0000 mg | ORAL_TABLET | Freq: Every day | ORAL | Status: DC
  Administered 2016-02-01 – 2016-02-02 (×2): 300 mg via ORAL
  Filled 2016-02-01 (×2): qty 1

## 2016-02-01 MED ORDER — FENTANYL CITRATE (PF) 100 MCG/2ML IJ SOLN
INTRAMUSCULAR | Status: AC
  Administered 2016-02-01: 50 ug via INTRAVENOUS
  Filled 2016-02-01: qty 2

## 2016-02-01 MED ORDER — VALSARTAN-HYDROCHLOROTHIAZIDE 320-12.5 MG PO TABS: 1.0000 | ORAL_TABLET | Freq: Every day | ORAL | Status: DC

## 2016-02-01 MED ORDER — ONDANSETRON HCL 4 MG/2ML IJ SOLN: 4.0000 mg | Freq: Four times a day (QID) | INTRAMUSCULAR | Status: DC | PRN

## 2016-02-01 MED ORDER — ACETAMINOPHEN 650 MG RE SUPP: 650.0000 mg | Freq: Four times a day (QID) | RECTAL | Status: DC | PRN

## 2016-02-01 MED ORDER — IRBESARTAN 300 MG PO TABS: 300.0000 mg | ORAL_TABLET | Freq: Every day | ORAL | Status: DC

## 2016-02-01 MED ORDER — CEFAZOLIN SODIUM-DEXTROSE 2-4 GM/100ML-% IV SOLN
2.0000 g | INTRAVENOUS | Status: AC
  Administered 2016-02-01: 2 g via INTRAVENOUS
  Filled 2016-02-01: qty 100

## 2016-02-01 MED ORDER — FENTANYL CITRATE (PF) 250 MCG/5ML IJ SOLN
INTRAMUSCULAR | Status: AC
  Filled 2016-02-01: qty 5

## 2016-02-01 SURGICAL SUPPLY — 75 items
APL SKNCLS STERI-STRIP NONHPOA (GAUZE/BANDAGES/DRESSINGS) ×1
BANDAGE ESMARK 6X9 LF (GAUZE/BANDAGES/DRESSINGS) ×1 IMPLANT
BENZOIN TINCTURE PRP APPL 2/3 (GAUZE/BANDAGES/DRESSINGS) ×3 IMPLANT
BLADE SAGITTAL 25.0X1.19X90 (BLADE) ×2 IMPLANT
BLADE SAGITTAL 25.0X1.19X90MM (BLADE) ×1
BLADE SAW SGTL 13X75X1.27 (BLADE) ×3 IMPLANT
BLADE SURG 10 STRL SS (BLADE) ×6 IMPLANT
BNDG CMPR 9X6 STRL LF SNTH (GAUZE/BANDAGES/DRESSINGS) ×1
BNDG CMPR MED 15X6 ELC VLCR LF (GAUZE/BANDAGES/DRESSINGS) ×1
BNDG ELASTIC 6X15 VLCR STRL LF (GAUZE/BANDAGES/DRESSINGS) ×3 IMPLANT
BNDG ESMARK 6X9 LF (GAUZE/BANDAGES/DRESSINGS) ×3
BOWL SMART MIX CTS (DISPOSABLE) ×3 IMPLANT
CAP KNEE TOTAL 3 SIGMA ×2 IMPLANT
CEMENT HV SMART SET (Cement) ×6 IMPLANT
CLOSURE WOUND 1/2 X4 (GAUZE/BANDAGES/DRESSINGS) ×1
COVER SURGICAL LIGHT HANDLE (MISCELLANEOUS) ×3 IMPLANT
CUFF TOURNIQUET SINGLE 34IN LL (TOURNIQUET CUFF) ×3 IMPLANT
CUFF TOURNIQUET SINGLE 44IN (TOURNIQUET CUFF) IMPLANT
DECANTER SPIKE VIAL GLASS SM (MISCELLANEOUS) ×3 IMPLANT
DRAPE EXTREMITY T 121X128X90 (DRAPE) ×3 IMPLANT
DRAPE INCISE IOBAN 66X45 STRL (DRAPES) ×5 IMPLANT
DRAPE PROXIMA HALF (DRAPES) ×3 IMPLANT
DRAPE U-SHAPE 47X51 STRL (DRAPES) ×3 IMPLANT
DRSG AQUACEL AG ADV 3.5X14 (GAUZE/BANDAGES/DRESSINGS) ×3 IMPLANT
DURAPREP 26ML APPLICATOR (WOUND CARE) ×6 IMPLANT
ELECT CAUTERY BLADE 6.4 (BLADE) ×3 IMPLANT
ELECT REM PT RETURN 9FT ADLT (ELECTROSURGICAL) ×3
ELECTRODE REM PT RTRN 9FT ADLT (ELECTROSURGICAL) ×1 IMPLANT
FACESHIELD WRAPAROUND (MASK) ×3 IMPLANT
FACESHIELD WRAPAROUND OR TEAM (MASK) ×1 IMPLANT
GLOVE BIO SURGEON STRL SZ7 (GLOVE) ×3 IMPLANT
GLOVE BIOGEL PI IND STRL 7.0 (GLOVE) ×1 IMPLANT
GLOVE BIOGEL PI IND STRL 7.5 (GLOVE) ×1 IMPLANT
GLOVE BIOGEL PI INDICATOR 7.0 (GLOVE) ×4
GLOVE BIOGEL PI INDICATOR 7.5 (GLOVE) ×2
GLOVE ECLIPSE 7.0 STRL STRAW (GLOVE) ×4 IMPLANT
GLOVE SS BIOGEL STRL SZ 7.5 (GLOVE) ×1 IMPLANT
GLOVE SUPERSENSE BIOGEL SZ 7.5 (GLOVE) ×2
GOWN STRL REUS W/ TWL LRG LVL3 (GOWN DISPOSABLE) ×1 IMPLANT
GOWN STRL REUS W/ TWL XL LVL3 (GOWN DISPOSABLE) ×2 IMPLANT
GOWN STRL REUS W/TWL LRG LVL3 (GOWN DISPOSABLE) ×3
GOWN STRL REUS W/TWL XL LVL3 (GOWN DISPOSABLE) ×6
HANDPIECE INTERPULSE COAX TIP (DISPOSABLE) ×3
HOOD PEEL AWAY FACE SHEILD DIS (HOOD) ×6 IMPLANT
HOOD PEEL AWAY FLYTE STAYCOOL (MISCELLANEOUS) ×2 IMPLANT
IMMOBILIZER KNEE 22 UNIV (SOFTGOODS) ×3 IMPLANT
KIT BASIN OR (CUSTOM PROCEDURE TRAY) ×3 IMPLANT
KIT ROOM TURNOVER OR (KITS) ×3 IMPLANT
MANIFOLD NEPTUNE II (INSTRUMENTS) ×3 IMPLANT
MARKER SKIN DUAL TIP RULER LAB (MISCELLANEOUS) ×3 IMPLANT
NDL 18GX1X1/2 (RX/OR ONLY) (NEEDLE) ×1 IMPLANT
NEEDLE 18GX1X1/2 (RX/OR ONLY) (NEEDLE) ×3 IMPLANT
NS IRRIG 1000ML POUR BTL (IV SOLUTION) ×3 IMPLANT
PACK TOTAL JOINT (CUSTOM PROCEDURE TRAY) ×3 IMPLANT
PAD ARMBOARD 7.5X6 YLW CONV (MISCELLANEOUS) ×6 IMPLANT
SET HNDPC FAN SPRY TIP SCT (DISPOSABLE) ×1 IMPLANT
STRIP CLOSURE SKIN 1/2X4 (GAUZE/BANDAGES/DRESSINGS) ×2 IMPLANT
SUCTION FRAZIER HANDLE 10FR (MISCELLANEOUS) ×2
SUCTION TUBE FRAZIER 10FR DISP (MISCELLANEOUS) ×1 IMPLANT
SUT MNCRL AB 3-0 PS2 18 (SUTURE) ×3 IMPLANT
SUT MNCRL AB 4-0 PS2 18 (SUTURE) ×2 IMPLANT
SUT VIC AB 0 CT1 27 (SUTURE) ×6
SUT VIC AB 0 CT1 27XBRD ANBCTR (SUTURE) ×2 IMPLANT
SUT VIC AB 1 CT1 27 (SUTURE) ×6
SUT VIC AB 1 CT1 27XBRD ANBCTR (SUTURE) ×1 IMPLANT
SUT VIC AB 1 CTX 27 (SUTURE) ×4 IMPLANT
SUT VIC AB 2-0 CT1 27 (SUTURE) ×9
SUT VIC AB 2-0 CT1 TAPERPNT 27 (SUTURE) ×2 IMPLANT
SYR 30ML LL (SYRINGE) ×3 IMPLANT
TOWEL OR 17X24 6PK STRL BLUE (TOWEL DISPOSABLE) ×3 IMPLANT
TOWEL OR 17X26 10 PK STRL BLUE (TOWEL DISPOSABLE) ×3 IMPLANT
TRAY FOLEY CATH 16FR SILVER (SET/KITS/TRAYS/PACK) ×3 IMPLANT
TUBE CONNECTING 12'X1/4 (SUCTIONS) ×1
TUBE CONNECTING 12X1/4 (SUCTIONS) ×2 IMPLANT
YANKAUER SUCT BULB TIP NO VENT (SUCTIONS) ×3 IMPLANT

## 2016-02-01 NOTE — Progress Notes (Signed)
Orthopedic Tech Progress Note Patient Details:  [name] 11/27/1934 HR:[id number]  CPM Right Knee CPM Right Knee: On Right Knee Flexion (Degrees): 90 Right Knee Extension (Degrees): 0   [name] 02/01/2016, 2:48 PM

## 2016-02-01 NOTE — Anesthesia Postprocedure Evaluation (Signed)
Anesthesia Post Note  Patient: [name]  Procedure(s) Performed: Procedure(s) (LRB): TOTAL KNEE ARTHROPLASTY (Right)  Patient location during evaluation: PACU Anesthesia Type: Spinal Level of consciousness: oriented and awake and alert Pain management: pain level controlled Vital Signs Assessment: post-procedure vital signs reviewed and stable Respiratory status: spontaneous breathing, respiratory function stable and patient connected to nasal cannula oxygen Cardiovascular status: blood pressure returned to baseline and stable Postop Assessment: no headache and no backache Anesthetic complications: no    Last Vitals:  Vitals:   02/01/16 1615 02/01/16 1706  BP:  (!) 150/68  Pulse:  (!) 58  Resp:  18  Temp: 36.4 C 36.6 C    Last Pain:  Vitals:   02/01/16 1706  TempSrc: Oral  PainSc:                  Jenalyn Girdner,JAMES TERRILL

## 2016-02-01 NOTE — Interval H&P Note (Signed)
History and Physical Interval Note:  02/01/2016 6:43 AM  [name]  has presented today for surgery, with the diagnosis of primary localized OA right knee  The various methods of treatment have been discussed with the patient and family. After consideration of risks, benefits and other options for treatment, the patient has consented to  Procedure(s): TOTAL KNEE ARTHROPLASTY (Right) as a surgical intervention .  The patient's history has been reviewed, patient examined, no change in status, stable for surgery.  I have reviewed the patient's chart and labs.  Questions were answered to the patient's satisfaction.     Elsie Saas A

## 2016-02-01 NOTE — Anesthesia Preprocedure Evaluation (Addendum)
Anesthesia Evaluation  Patient identified by MRN, date of birth, ID band Patient awake    Reviewed: Allergy & Precautions, NPO status , Patient's Chart, lab work & pertinent test results  History of Anesthesia Complications Negative for: history of anesthetic complications  Airway Mallampati: II  TM Distance: >3 FB     Dental  (+) Dental Advisory Given, Teeth Intact   Pulmonary neg pulmonary ROS,    breath sounds clear to auscultation       Cardiovascular negative cardio ROS  + Valvular Problems/Murmurs (mild AI)  Rhythm:Regular Rate:Normal     Neuro/Psych TIA   GI/Hepatic negative GI ROS, Neg liver ROS,   Endo/Other  diabetes, Well Controlled, Type 2, Oral Hypoglycemic Agents  Renal/GU negative Renal ROS     Musculoskeletal  (+) Arthritis ,   Abdominal   Peds  Hematology   Anesthesia Other Findings   Reproductive/Obstetrics                            Anesthesia Physical Anesthesia Plan  ASA: II  Anesthesia Plan: Spinal   Post-op Pain Management:    Induction: Intravenous  Airway Management Planned: Natural Airway  Additional Equipment:   Intra-op Plan:   Post-operative Plan:   Informed Consent: I have reviewed the patients History and Physical, chart, labs and discussed the procedure including the risks, benefits and alternatives for the proposed anesthesia with the patient or authorized representative who has indicated his/her understanding and acceptance.     Plan Discussed with: CRNA and Surgeon  Anesthesia Plan Comments:         Anesthesia Quick Evaluation

## 2016-02-01 NOTE — Anesthesia Procedure Notes (Signed)
Spinal  Patient location during procedure: OR Start time: 02/01/2016 12:10 PM End time: 02/01/2016 12:15 PM Staffing Anesthesiologist: Rica Koyanagi Performed: anesthesiologist  Preanesthetic Checklist Completed: patient identified, site marked, surgical consent, pre-op evaluation, timeout performed, IV checked, risks and benefits discussed and monitors and equipment checked Spinal Block Patient position: sitting Prep: ChloraPrep Patient monitoring: heart rate, cardiac monitor, continuous pulse ox and blood pressure Approach: midline Location: L3-4 Injection technique: single-shot Needle Needle type: Pencan  Needle gauge: 24 G Needle length: 9 cm Needle insertion depth: 5 cm Assessment Sensory level: T6

## 2016-02-01 NOTE — Op Note (Signed)
The patient has been re-examined, and the chart reviewed, and there have been no interval changes to the documented history and physical.    The risks, benefits, and alternatives have been discussed at length, and the patient is willing to proceed.   MRN:     [id number] DOB/AGE:    [birth year] / 81 y.o.       OPERATIVE REPORT    DATE OF PROCEDURE:  02/01/2016       PREOPERATIVE DIAGNOSIS:   Primary localized OA Right Knee      Estimated body mass index is 29.31 kg/m as calculated from the following:   Height as of this encounter: 5\' 6"  (1.676 m).   Weight as of this encounter: 82.4 kg (181 lb 9.6 oz).                                                        POSTOPERATIVE DIAGNOSIS:   same                                                                      PROCEDURE:  Procedure(s): TOTAL KNEE ARTHROPLASTY Using Depuy Sigma RP implants #4 Femur, #4Tibia, 41mm  RP bearing, 32 Patella     SURGEON: Tom Ragsdale A    ASSISTANT:  Kirstin Shepperson PA-C   (Present and scrubbed throughout the case, critical for assistance with exposure, retraction, instrumentation, and closure.)         ANESTHESIA: Spinal with Adductor Nerve Block     TOURNIQUET TIME: 0000000   COMPLICATIONS:  None     SPECIMENS: None   INDICATIONS FOR PROCEDURE: The patient has  Degenerative Joint Disease Right Knee, valgus deformities, XR shows bone on bone arthritis. Patient has failed all conservative measures including anti-inflammatory medicines, narcotics, attempts at  exercise and weight loss, cortisone injections and viscosupplementation.  Risks and benefits of surgery have been discussed, questions answered.   DESCRIPTION OF PROCEDURE: The patient identified by armband, received  right femoral nerve block and IV antibiotics, in the holding area at [facility]. Patient taken to the operating room, appropriate anesthetic  monitors were attached General endotracheal anesthesia induced with  the patient in  supine position, Foley catheter was inserted. Tourniquet  applied high to the operative thigh. Lateral post and foot positioner  applied to the table, the lower extremity was then prepped and draped  in usual sterile fashion from the ankle to the tourniquet. Time-out procedure was performed. The limb was wrapped with an Esmarch bandage and the tourniquet inflated to 365 mmHg. We began the operation by making the anterior midline incision starting at handbreadth above the patella going over the patella 1 cm medial to and  4 cm distal to the tibial tubercle. Small bleeders in the skin and the  subcutaneous tissue identified and cauterized. Transverse retinaculum was incised and reflected medially and a medial parapatellar arthrotomy was accomplished. the patella was everted and theprepatellar fat pad resected. The superficial medial collateral  ligament was then elevated from anterior to posterior along the proximal  flare of the tibia and anterior  half of the menisci resected. The knee was hyperflexed exposing bone on bone arthritis. Peripheral and notch osteophytes as well as the cruciate ligaments were then resected. We continued to  work our way around posteriorly along the proximal tibia, and externally  rotated the tibia subluxing it out from underneath the femur. A McHale  retractor was placed through the notch and a lateral Hohmann retractor  placed, and we then drilled through the proximal tibia in line with the  axis of the tibia followed by an intramedullary guide rod and 2-degree  posterior slope cutting guide. The tibial cutting guide was pinned into place  allowing resection of 6 mm of bone medially and about 4 mm of bone  laterally because of her valgus deformity. Satisfied with the tibial resection, we then  entered the distal femur 2 mm anterior to the PCL origin with the  intramedullary guide rod and applied the distal femoral cutting guide  set at 33mm, with 5 degrees of valgus.  This was pinned along the  epicondylar axis. At this point, the distal femoral cut was accomplished without difficulty. We then sized for a #4 femoral component and pinned the guide in 3 degrees of external rotation.The chamfer cutting guide was pinned into place. The anterior, posterior, and chamfer cuts were accomplished without difficulty followed by  the  RP box cutting guide and the box cut. We also removed posterior osteophytes from the posterior femoral condyles. At this  time, the knee was brought into full extension. We checked our  extension and flexion gaps and found them symmetric at 42mm.  The patella thickness measured at 25 mm. We set the cutting guide at 15 and removed the posterior 9.5-10 mm  of the patella sized for 32 button and drilled the lollipop. The knee  was then once again hyperflexed exposing the proximal tibia. We sized for a #4 tibial base plate, applied the smokestack and the conical reamer followed by the the Delta fin keel punch. We then hammered into place the  RP trial femoral component, inserted a 1 trial bearing, trial patellar button, and took the knee through range of motion from 0-130 degrees. No thumb pressure was required for patellar  tracking. At this point, all trial components were removed, a double batch of DePuy HV cement  was mixed and applied to all bony metallic mating surfaces except for the posterior condyles of the femur itself. In order, we  hammered into place the tibial tray and removed excess cement, the femoral component and removed excess cement, a 35mm  RP bearing  was inserted, and the knee brought to full extension with compression.  The patellar button was clamped into place, and excess cement  removed. While the cement cured the wound was irrigated out with normal saline solution pulse lavage.. Ligament stability and patellar tracking were checked and found to be excellent.. The parapatellar arthrotomy was closed with  #1 Vicryl suture. The  subcutaneous tissue with 0 and 2-0 undyed  Vicryl suture, and 4-0 Monocryl.. A dressing of Aquaseal,  4 x 4, dressing sponges, Webril, and Ace wrap applied. Needle and sponge count were correct times 2.The patient awakened, extubated, and taken to recovery room without difficulty. Vascular status was normal, pulses 2+ and symmetric.   Cherese Lozano A 02/01/2016, 1:54 PM

## 2016-02-01 NOTE — Anesthesia Procedure Notes (Signed)
Anesthesia Regional Block:  Adductor canal block  Pre-Anesthetic Checklist: ,, timeout performed, Correct Patient, Correct Site, Correct Laterality, Correct Procedure, Correct Position, site marked, Risks and benefits discussed,  Surgical consent,  Pre-op evaluation,  At surgeon's request and post-op pain management  Laterality: Right and Lower  Prep: chloraprep       Needles:   Needle Type: Echogenic Needle     Needle Length: 5cm 5 cm Needle Gauge: 21 and 21 G    Additional Needles:  Procedures: ultrasound guided (picture in chart) Adductor canal block Narrative:  Start time: 02/01/2016 11:30 AM End time: 02/01/2016 11:45 AM Injection made incrementally with aspirations every 5 mL.  Performed by: Personally  Anesthesiologist: Mitzie Marlar  Additional Notes: Tolerated well

## 2016-02-01 NOTE — Transfer of Care (Signed)
Immediate Anesthesia Transfer of Care Note  Patient: [name]  Procedure(s) Performed: Procedure(s): TOTAL KNEE ARTHROPLASTY (Right)  Patient Location: PACU  Anesthesia Type:Spinal and MAC combined with regional for post-op pain  Level of Consciousness: awake, alert , oriented and patient cooperative  Airway & Oxygen Therapy: Patient Spontanous Breathing  Post-op Assessment: Report given to RN and Post -op Vital signs reviewed and stable  Post vital signs: Reviewed and stable  Last Vitals:  Vitals:   02/01/16 1157 02/01/16 1430  BP:  (P) 113/71  Pulse: (!) 58   Resp: 15 (P) 16  Temp:  (P) 36.4 C    Last Pain:  Vitals:   02/01/16 0927  TempSrc: Oral         Complications: No apparent anesthesia complications

## 2016-02-02 ENCOUNTER — Encounter (HOSPITAL_COMMUNITY): Payer: Self-pay | Admitting: Physician Assistant

## 2016-02-02 DIAGNOSIS — D62 Acute posthemorrhagic anemia: Secondary | ICD-10-CM | POA: Diagnosis not present

## 2016-02-02 HISTORY — DX: Acute posthemorrhagic anemia: D62

## 2016-02-02 LAB — BASIC METABOLIC PANEL
Anion gap: 9 (ref 5–15)
BUN: 30 mg/dL — ABNORMAL HIGH (ref 6–20)
CALCIUM: 8.5 mg/dL — AB (ref 8.9–10.3)
CO2: 21 mmol/L — AB (ref 22–32)
CREATININE: 1.36 mg/dL — AB (ref 0.44–1.00)
Chloride: 107 mmol/L (ref 101–111)
GFR calc Af Amer: 42 mL/min — ABNORMAL LOW (ref >60)
GFR calc non Af Amer: 36 mL/min — ABNORMAL LOW (ref >60)
GLUCOSE: 177 mg/dL — AB (ref 65–99)
Potassium: 4.3 mmol/L (ref 3.5–5.1)
Sodium: 137 mmol/L (ref 135–145)

## 2016-02-02 LAB — CBC
HEMATOCRIT: 25.6 % — AB (ref 36.0–46.0)
Hemoglobin: 8.6 g/dL — ABNORMAL LOW (ref 12.0–15.0)
MCH: 29.3 pg (ref 26.0–34.0)
MCHC: 33.6 g/dL (ref 30.0–36.0)
MCV: 87.1 fL (ref 78.0–100.0)
Platelets: 160 10*3/uL (ref 150–400)
RBC: 2.94 MIL/uL — ABNORMAL LOW (ref 3.87–5.11)
RDW: 12.9 % (ref 11.5–15.5)
WBC: 9.1 10*3/uL (ref 4.0–10.5)

## 2016-02-02 LAB — PREPARE RBC (CROSSMATCH)

## 2016-02-02 LAB — GLUCOSE, CAPILLARY
GLUCOSE-CAPILLARY: 163 mg/dL — AB (ref 65–99)
GLUCOSE-CAPILLARY: 119 mg/dL — AB (ref 65–99)
GLUCOSE-CAPILLARY: 218 mg/dL — AB (ref 65–99)
Glucose-Capillary: 195 mg/dL — ABNORMAL HIGH (ref 65–99)

## 2016-02-02 LAB — HEMOGLOBIN AND HEMATOCRIT, BLOOD
HEMATOCRIT: 31.5 % — AB (ref 36.0–46.0)
Hemoglobin: 10.7 g/dL — ABNORMAL LOW (ref 12.0–15.0)

## 2016-02-02 MED ORDER — FUROSEMIDE 10 MG/ML IJ SOLN
20.0000 mg | Freq: Once | INTRAMUSCULAR | Status: AC
  Administered 2016-02-02: 20 mg via INTRAVENOUS
  Filled 2016-02-02: qty 2

## 2016-02-02 MED ORDER — SODIUM CHLORIDE 0.9 % IV SOLN
Freq: Once | INTRAVENOUS | Status: AC
  Administered 2016-02-02: 11:00:00 via INTRAVENOUS

## 2016-02-02 NOTE — Progress Notes (Signed)
   02/02/16 1400  Clinical Encounter Type  Visited With Patient  Visit Type Other (Comment)  Referral From Nurse  Advance Directives (For Healthcare)  Does patient want to make changes to advanced directive? Yes - Spiritual care consult ordered  Chaplin responded to consult for an advance directive.  Chaplain met with patient.  Chaplain reviewed the difference between a health care power of attorney and living will.  Patient indicated she only wants the health care power of attorney.  Chaplain reviewed each section and had patient initial document.  Chaplain contacted notary.

## 2016-02-02 NOTE — Progress Notes (Signed)
Completed transfusing 2 units PRBC. Vital signs stable and pt resting comfortably in bed. Post H&H lab drawn placed. Will continue to monitor

## 2016-02-02 NOTE — Evaluation (Signed)
Physical Therapy Evaluation Patient Details Name: [name] MRN: [id number] DOB: [birth year] Today's Date: 02/02/2016   History of Present Illness  Pt is a 81 y/o female s/p R TKA. PMH including but not limited to DM, HTN, TIA, heart murmur, and aortic insufficiency.  Clinical Impression  Pt presented OOB in recliner when PT entered room. Pt performed all functional mobility with min guard for safety only, no physical assist required. Pt would continue to benefit from skilled physical therapy services at this time while admitted and after d/c to address her below listed limitations in order to improve her overall safety and independence with functional mobility. PT plan to perform stair training at next session if appropriate.      Follow Up Recommendations Home health PT    Equipment Recommendations  None recommended by PT;Other (comment) (pt reported already having necessary equipment at home)    Recommendations for Other Services       Precautions / Restrictions Precautions Precautions: Fall;Knee Precaution Booklet Issued: Yes (comment) Precaution Comments: PT reviewed precautions following TKA and positioning of LE. Restrictions Weight Bearing Restrictions: Yes RLE Weight Bearing: Weight bearing as tolerated      Mobility  Bed Mobility               General bed mobility comments: Pt was sitting OOB in recliner when PT entered room.  Transfers Overall transfer level: Needs assistance Equipment used: Rolling walker (2 wheeled) Transfers: Sit to/from Stand Sit to Stand: Min guard         General transfer comment: Pt required increased time and VC'ing for bilateral hand positioning.  Ambulation/Gait Ambulation/Gait assistance: Min guard Ambulation Distance (Feet): 150 Feet Assistive device: Rolling walker (2 wheeled) Gait Pattern/deviations: Step-through pattern;Decreased weight shift to right Gait velocity: decreased Gait velocity interpretation:  Below normal speed for age/gender    Stairs            Wheelchair Mobility    Modified Rankin (Stroke Patients Only)       Balance Overall balance assessment: Needs assistance Sitting-balance support: Feet supported;No upper extremity supported Sitting balance-Leahy Scale: Fair     Standing balance support: Bilateral upper extremity supported Standing balance-Leahy Scale: Poor                               Pertinent Vitals/Pain Pain Assessment: 0-10 Pain Score: 4  Pain Location: R knee Pain Descriptors / Indicators: Discomfort;Operative site guarding Pain Intervention(s): Limited activity within patient's tolerance;Monitored during session;Repositioned    Home Living Family/patient expects to be discharged to:: Private residence Living Arrangements: Children Available Help at Discharge: Family;Available PRN/intermittently Type of Home: House Home Access: Stairs to enter Entrance Stairs-Rails: Can reach both Entrance Stairs-Number of Steps: 3 Home Layout: One level Home Equipment: Walker - 2 wheels;Cane - quad      Prior Function Level of Independence: Independent with assistive device(s)         Comments: Pt was previously ambulating with use of cane.     Hand Dominance        Extremity/Trunk Assessment   Upper Extremity Assessment: Overall WFL for tasks assessed           Lower Extremity Assessment: RLE deficits/detail RLE Deficits / Details: Pt with decreased strength and ROM limitations secondary to post-op. Sensation grossly intact.       Communication   Communication: No difficulties  Cognition Arousal/Alertness: Awake/alert Behavior During Therapy: WFL for tasks  assessed/performed Overall Cognitive Status: Within Functional Limits for tasks assessed                      General Comments      Exercises Total Joint Exercises Ankle Circles/Pumps: AROM;Strengthening;Right;10 reps Hip ABduction/ADduction:  AROM;Strengthening;Right;10 reps Long Arc Quad: AROM;Strengthening;Right;10 reps Knee Flexion: AROM;Strengthening;Right;10 reps      Assessment/Plan    PT Assessment Patient needs continued PT services  PT Diagnosis Difficulty walking   PT Problem List Decreased strength;Decreased range of motion;Decreased activity tolerance;Decreased balance;Decreased coordination;Decreased mobility;Decreased knowledge of use of DME;Pain  PT Treatment Interventions Gait training;DME instruction;Stair training;Therapeutic activities;Functional mobility training;Therapeutic exercise;Balance training;Patient/family education   PT Goals (Current goals can be found in the Care Plan section) Acute Rehab PT Goals Patient Stated Goal: return home PT Goal Formulation: With patient Time For Goal Achievement: 02/09/16 Potential to Achieve Goals: Good    Frequency 7X/week   Barriers to discharge        Co-evaluation               End of Session Equipment Utilized During Treatment: Gait belt Activity Tolerance: Patient limited by fatigue;Patient limited by pain Patient left: in chair;with call bell/phone within reach Nurse Communication: Mobility status         Time: [id number] PT Time Calculation (min) (ACUTE ONLY): 24 min   Charges:   PT Evaluation $PT Eval Moderate Complexity: 1 Procedure PT Treatments $Gait Training: 8-22 mins   PT G CodesClearnce Sorrel Doshia Dalia 02/02/2016, 9:40 AM Sherie Don, PT, DPT [phone]

## 2016-02-02 NOTE — Progress Notes (Signed)
Orthopedic Tech Progress Note Patient Details:  [name] 07-04-1935 [id number]  Patient ID: [name], female   DOB: 1935-03-03, 81 y.o.   MRN: [id number] Applied cpm 0-60  Karolee Stamps 02/02/2016, 5:47 AM

## 2016-02-02 NOTE — Progress Notes (Signed)
PT Cancellation Note  Patient Details Name: [name] MRN: [id number] DOB: 31-Dec-1934   Cancelled Treatment:    Reason Eval/Treat Not Completed: Other (comment). Pt receiving blood at this time. PT will continue to f/u with pt as appropriate.   [name] 02/02/2016, [place], [place], DPT [phone]

## 2016-02-02 NOTE — Care Management Note (Signed)
Case Management Note  Patient Details  Name: [name] MRN: [id number] Date of Birth: 05/23/1935  Subjective/Objective:    81 yr old female s/p right total knee arthroplasty.                Action/Plan:  Case manager spoke with patient concerning [place] and DME needs. Case manager is trying to sort out which home health agency will provide [facility],  Expected Discharge Date:                  Expected Discharge Plan:  [facility]  In-House Referral:     Discharge planning Services  CM Consult  Post Acute Care Choice:  Home Health, Durable Medical Equipment Choice offered to:  Patient  DME Arranged:  CPM DME Agency:  [employer]  HH Arranged:  PT HH Agency:     Status of Service:  In process, will continue to follow  If discussed at Long Length of Stay Meetings, dates discussed:    Additional Comments:  Ninfa Meeker, RN 02/02/2016, 3:17 PM

## 2016-02-02 NOTE — Care Management Important Message (Signed)
Important Message  Patient Details  Name: [name] MRN: [id number] Date of Birth: 20-Oct-1934   Medicare Important Message Given:  Yes    Loann Quill 02/02/2016, 9:46 AM

## 2016-02-02 NOTE — Progress Notes (Signed)
PHARMACIST - PHYSICIAN COMMUNICATION DR: Noemi Chapel or extender CONCERNING:  Diabetes Management Orders  RECOMMENDATION: Please call to discuss DM management. Lantus will be on hold until clarified by provider. Patient continues on SSI.  [id number] until 16:00; main pharmacy [phone]  DESCRIPTION:  A1c 5.9 - takes glipizide, metformin, Victoza PTA Inpatient orders for moderate SSI, meal coverage, and Lantus 25 units SQ qhs  Concerned inpatient orders are aggressive for A1c and PTA medications. Pharmacy paged providers last night and this morning with no call back.   [name], PharmD, BCPS Clinical Pharmacist 02/02/2016 11:08 AM

## 2016-02-02 NOTE — Progress Notes (Signed)
Subjective: 1 Day Post-Op Procedure(s) (LRB): TOTAL KNEE ARTHROPLASTY (Right) Patient reports pain as 4 on 0-10 scale.    Objective: Vital signs in last 24 hours: Temp:  [97.5 F (36.4 C)-98.1 F (36.7 C)] 98.1 F (36.7 C) (07/25 0648) Pulse Rate:  [56-71] 62 (07/25 0648) Resp:  [10-27] 17 (07/24 2115) BP: (95-188)/(53-102) 107/56 (07/25 0648) SpO2:  [92 %-100 %] 92 % (07/25 0648)  Intake/Output from previous day: 07/24 0701 - 07/25 0700 In: 1500 [I.V.:1500] Out: 2250 [Urine:2200; Blood:50] Intake/Output this shift: No intake/output data recorded.   Recent Labs  02/02/16 0633  HGB 8.6*    Recent Labs  02/02/16 0633  WBC 9.1  RBC 2.94*  HCT 25.6*  PLT 160    Recent Labs  02/02/16 0633  NA 137  K 4.3  CL 107  CO2 21*  BUN 30*  CREATININE 1.36*  GLUCOSE 177*  CALCIUM 8.5*   No results for input(s): LABPT, INR in the last 72 hours.  ABD soft Neurovascular intact Sensation intact distally Intact pulses distally Dorsiflexion/Plantar flexion intact Incision: dressing C/D/I  Assessment/Plan: 1 Day Post-Op Procedure(s) (LRB): TOTAL KNEE ARTHROPLASTY (Right)  Principal Problem:   Primary localized osteoarthritis of right knee Active Problems:   AI (aortic insufficiency)   Diabetes (HCC)   HTN (hypertension)   Murmur, cardiac   TIA (transient ischemic attack)   Postoperative anemia due to acute blood loss  Advance diet Up with therapy D/C IV fluids Discharge home with home health Wednesday or Thursday Blood transfusion today for acute post op blood loss anemia   [name] J 02/02/2016, 9:55 AM

## 2016-02-02 NOTE — Progress Notes (Signed)
Physical Therapy Treatment Patient Details Name: [name] MRN: [id number] DOB: [birth year] Today's Date: 02/02/2016    History of Present Illness Pt is a 81 y/o female s/p R TKA. PMH including but not limited to DM, HTN, TIA, heart murmur, and aortic insufficiency.    PT Comments    Pt presented supine in bed with HOB elevated, R LE in CPM and nurse in room. Pt was awake and willing to participate in therapy session; however, she was still receiving blood transfusion, therefore, did not stair train with pt at this time. PT increased CPM to 70 degrees of flexion at the end of the session upon pt's request. Pt would continue to benefit from skilled physical therapy services at this time while admitted and after d/c to address her limitations in order to improve her safety and independence with functional mobility.    Follow Up Recommendations  Home health PT;Supervision for mobility/OOB     Equipment Recommendations  None recommended by PT;Other (comment) (pt reported already having necessary equipment at home)    Recommendations for Other Services       Precautions / Restrictions Precautions Precautions: Fall;Knee Precaution Booklet Issued: Yes (comment) Precaution Comments: PT reviewed precautions following TKA and positioning of LE. Restrictions Weight Bearing Restrictions: Yes RLE Weight Bearing: Weight bearing as tolerated    Mobility  Bed Mobility Overal bed mobility: Needs Assistance Bed Mobility: Supine to Sit;Sit to Supine     Supine to sit: Supervision Sit to supine: Supervision      Transfers Overall transfer level: Needs assistance Equipment used: Rolling walker (2 wheeled) Transfers: Sit to/from Stand Sit to Stand: Min guard         General transfer comment: Pt required increased time and VC'ing for bilateral hand positioning.  Ambulation/Gait Ambulation/Gait assistance: Min guard Ambulation Distance (Feet): 25 Feet Assistive device:  Rolling walker (2 wheeled) Gait Pattern/deviations: Step-through pattern Gait velocity: decreased Gait velocity interpretation: Below normal speed for age/gender     Stairs            Wheelchair Mobility    Modified Rankin (Stroke Patients Only)       Balance Overall balance assessment: Needs assistance Sitting-balance support: No upper extremity supported;Feet supported Sitting balance-Leahy Scale: Fair     Standing balance support: Bilateral upper extremity supported;During functional activity Standing balance-Leahy Scale: Poor                      Cognition Arousal/Alertness: Awake/alert Behavior During Therapy: WFL for tasks assessed/performed Overall Cognitive Status: Within Functional Limits for tasks assessed                      Exercises Total Joint Exercises Ankle Circles/Pumps: AROM;Strengthening;Right;10 reps Long Arc Quad: AROM;Strengthening;Right;10 reps Knee Flexion: AROM;Strengthening;Right;10 reps Goniometric ROM: Flexion: 80 degrees, Extension: lacking 12 degrees (in sitting) Marching in Standing: AROM;Strengthening;Both;10 reps;Seated    General Comments        Pertinent Vitals/Pain Pain Assessment: 0-10 Pain Score: 4  Pain Location: R knee Pain Descriptors / Indicators: Discomfort;Operative site guarding Pain Intervention(s): Limited activity within patient's tolerance;Monitored during session;Repositioned    Home Living                      Prior Function            PT Goals (current goals can now be found in the care plan section) Acute Rehab PT Goals Patient Stated Goal: return home PT  Goal Formulation: With patient Time For Goal Achievement: 02/09/16 Potential to Achieve Goals: Good Progress towards PT goals: Progressing toward goals    Frequency  7X/week    PT Plan Current plan remains appropriate    Co-evaluation             End of Session Equipment Utilized During Treatment: Gait  belt Activity Tolerance: Patient limited by fatigue;Patient limited by pain Patient left: in bed;in CPM;with call bell/phone within reach;Other (comment) (representatives from [employer] in room; CPM on 0-70)     Time: [id number] PT Time Calculation (min) (ACUTE ONLY): 19 min  Charges:  $Therapeutic Exercise: 8-22 mins                    G CodesClearnce Sorrel Jamine Highfill 02-29-16, 2:02 PM Sherie Don, [place], DPT [phone]

## 2016-02-03 ENCOUNTER — Encounter (HOSPITAL_COMMUNITY): Payer: Self-pay | Admitting: Physician Assistant

## 2016-02-03 DIAGNOSIS — B952 Enterococcus as the cause of diseases classified elsewhere: Secondary | ICD-10-CM

## 2016-02-03 DIAGNOSIS — N390 Urinary tract infection, site not specified: Secondary | ICD-10-CM

## 2016-02-03 HISTORY — DX: Urinary tract infection, site not specified: N39.0

## 2016-02-03 HISTORY — DX: Enterococcus as the cause of diseases classified elsewhere: B95.2

## 2016-02-03 LAB — TYPE AND SCREEN
ABO/RH(D): A POS
Antibody Screen: NEGATIVE
UNIT DIVISION: 0
Unit division: 0

## 2016-02-03 LAB — CBC
HCT: 30.2 % — ABNORMAL LOW (ref 36.0–46.0)
HEMOGLOBIN: 10.2 g/dL — AB (ref 12.0–15.0)
MCH: 29 pg (ref 26.0–34.0)
MCHC: 33.8 g/dL (ref 30.0–36.0)
MCV: 85.8 fL (ref 78.0–100.0)
PLATELETS: 130 10*3/uL — AB (ref 150–400)
RBC: 3.52 MIL/uL — ABNORMAL LOW (ref 3.87–5.11)
RDW: 14.3 % (ref 11.5–15.5)
WBC: 12.7 10*3/uL — AB (ref 4.0–10.5)

## 2016-02-03 LAB — BASIC METABOLIC PANEL
ANION GAP: 10 (ref 5–15)
BUN: 35 mg/dL — ABNORMAL HIGH (ref 6–20)
CALCIUM: 9.2 mg/dL (ref 8.9–10.3)
CO2: 24 mmol/L (ref 22–32)
CREATININE: 1.19 mg/dL — AB (ref 0.44–1.00)
Chloride: 107 mmol/L (ref 101–111)
GFR, EST AFRICAN AMERICAN: 49 mL/min — AB (ref >60)
GFR, EST NON AFRICAN AMERICAN: 43 mL/min — AB (ref >60)
GLUCOSE: 174 mg/dL — AB (ref 65–99)
Potassium: 4 mmol/L (ref 3.5–5.1)
Sodium: 141 mmol/L (ref 135–145)

## 2016-02-03 LAB — GLUCOSE, CAPILLARY
Glucose-Capillary: 181 mg/dL — ABNORMAL HIGH (ref 65–99)
Glucose-Capillary: 116 mg/dL — ABNORMAL HIGH (ref 65–99)

## 2016-02-03 MED ORDER — ASPIRIN 325 MG PO TBEC: DELAYED_RELEASE_TABLET | ORAL | 0 refills | Status: DC

## 2016-02-03 MED ORDER — POLYETHYLENE GLYCOL 3350 17 G PO PACK: PACK | ORAL | 0 refills | Status: DC

## 2016-02-03 MED ORDER — DOCUSATE SODIUM 100 MG PO CAPS: ORAL_CAPSULE | ORAL | 0 refills | Status: DC

## 2016-02-03 MED ORDER — LEVOFLOXACIN 500 MG PO TABS: 250.0000 mg | ORAL_TABLET | Freq: Every day | ORAL | Status: DC

## 2016-02-03 MED ORDER — ACETAMINOPHEN 325 MG PO TABS: 650.0000 mg | ORAL_TABLET | Freq: Four times a day (QID) | ORAL | Status: DC | PRN

## 2016-02-03 MED ORDER — HYDROMORPHONE HCL 2 MG PO TABS: ORAL_TABLET | ORAL | 0 refills | Status: DC

## 2016-02-03 MED ORDER — LEVOFLOXACIN 500 MG PO TABS
500.0000 mg | ORAL_TABLET | Freq: Once | ORAL | Status: AC
  Administered 2016-02-03: 500 mg via ORAL
  Filled 2016-02-03: qty 1

## 2016-02-03 MED ORDER — LEVOFLOXACIN 500 MG PO TABS: ORAL_TABLET | ORAL | 0 refills | Status: DC

## 2016-02-03 NOTE — Progress Notes (Signed)
Physical Therapy Treatment Patient Details Name: [name] MRN: [id number] DOB: [birth year] Today's Date: 02/03/2016    History of Present Illness Pt is a 81 y/o female s/p R TKA. PMH including but not limited to DM, HTN, TIA, heart murmur, and aortic insufficiency.    PT Comments    Pt presented sitting upright in recliner, awake and willing to participate in therapy session. Pt reported that she is scheduled to d/c home later today. Pt successfully completed stair training during this session with min guard for safety only. Pt would continue to benefit from skilled physical therapy services at this time while admitted and after d/c to address her limitations in order to improve her overall safety and independence with functional mobility.   Follow Up Recommendations  Home health PT;Supervision for mobility/OOB     Equipment Recommendations  None recommended by PT;Other (comment) (pt reported already having all necessary equipment at home)    Recommendations for Other Services       Precautions / Restrictions Precautions Precautions: Fall;Knee Precaution Booklet Issued: Yes (comment) Precaution Comments: PT reviewed precautions following TKA and positioning of LE. Restrictions Weight Bearing Restrictions: Yes RLE Weight Bearing: Weight bearing as tolerated    Mobility  Bed Mobility               General bed mobility comments: pt sitting OOB in recliner when PT entered room  Transfers Overall transfer level: Needs assistance Equipment used: Rolling walker (2 wheeled) Transfers: Sit to/from Stand Sit to Stand: Min guard         General transfer comment: Pt required increased time and VC'ing for bilateral hand positioning.  Ambulation/Gait Ambulation/Gait assistance: Min guard Ambulation Distance (Feet): 200 Feet Assistive device: Rolling walker (2 wheeled) Gait Pattern/deviations: Step-through pattern Gait velocity: decreased Gait velocity  interpretation: Below normal speed for age/gender     Stairs Stairs: Yes Stairs assistance: Min guard Stair Management: One rail Left Number of Stairs: 4 General stair comments: Pt ascended stairs with L LE leading and descended stairs with R LE leading.  Wheelchair Mobility    Modified Rankin (Stroke Patients Only)       Balance Overall balance assessment: Needs assistance Sitting-balance support: Feet supported;No upper extremity supported Sitting balance-Leahy Scale: Fair     Standing balance support: During functional activity;Single extremity supported Standing balance-Leahy Scale: Poor                      Cognition Arousal/Alertness: Awake/alert Behavior During Therapy: WFL for tasks assessed/performed Overall Cognitive Status: Within Functional Limits for tasks assessed                      Exercises Total Joint Exercises Quad Sets: AROM;Strengthening;Right;10 reps;Seated Hip ABduction/ADduction: AROM;Strengthening;Right;10 reps;Seated Straight Leg Raises: AROM;Strengthening;Right;10 reps;Seated    General Comments        Pertinent Vitals/Pain Pain Assessment: 0-10 Pain Score: 7  (at end of session) Pain Location: lateral aspect of R knee Pain Descriptors / Indicators: Discomfort Pain Intervention(s): Limited activity within patient's tolerance;Monitored during session;Repositioned    Home Living                      Prior Function            PT Goals (current goals can now be found in the care plan section) Acute Rehab PT Goals Patient Stated Goal: return home PT Goal Formulation: With patient Time For Goal Achievement: 02/09/16 Potential to  Achieve Goals: Good Progress towards PT goals: Progressing toward goals    Frequency  7X/week    PT Plan Current plan remains appropriate    Co-evaluation             End of Session Equipment Utilized During Treatment: Gait belt Activity Tolerance: Patient limited by  fatigue Patient left: in chair;with call bell/phone within reach;with family/visitor present     Time: 1040-1059 PT Time Calculation (min) (ACUTE ONLY): 19 min  Charges:  $Gait Training: 8-22 mins                    G CodesClearnce Sorrel Laroy Sims 2016/02/25, 11:28 AM Sherie Don, PT, DPT [phone]

## 2016-02-03 NOTE — Evaluation (Signed)
Occupational Therapy Evaluation Patient Details Name: [name] MRN: [id number] DOB: Aug 21, 1934 Today's Date: 02/03/2016    History of Present Illness Pt is a 81 y/o female s/p R TKA. PMH including but not limited to DM, HTN, TIA, heart murmur, and aortic insufficiency.   Clinical Impression   Pt was admitted for the above sx.  All education was completed. No further OT is needed at this time    Follow Up Recommendations  No OT follow up;Supervision/Assistance - 24 hour    Equipment Recommendations  3 in 1 bedside comode    Recommendations for Other Services       Precautions / Restrictions Precautions Precautions: Fall;Knee Precaution Booklet Issued: Yes (comment) Precaution Comments:  reviewed precautions following TKA and positioning of LE. Restrictions Weight Bearing Restrictions: Yes RLE Weight Bearing: Weight bearing as tolerated      Mobility Bed Mobility               General bed mobility comments: oob  Transfers Overall transfer level: Needs assistance Equipment used: Rolling walker (2 wheeled) Transfers: Sit to/from Stand Sit to Stand: Min assist;Min guard         General transfer comment: min A for first sit to stand to steady then min guard for safety    Balance Overall balance assessment: Needs assistance Sitting-balance support: Feet supported;No upper extremity supported Sitting balance-Leahy Scale: Fair     Standing balance support: During functional activity;Single extremity supported Standing balance-Leahy Scale: Poor                              ADL Overall ADL's : Needs assistance/impaired     Grooming: Wash/dry hands;Supervision/safety;Standing   Upper Body Bathing: Set up;Sitting   Lower Body Bathing: Minimal assistance;Sit to/from stand   Upper Body Dressing : Set up;Sitting   Lower Body Dressing: Minimal assistance;Sit to/from stand   Toilet Transfer: Min guard;Ambulation;BSC;Requires  wide/bariatric   Toileting- Water quality scientist and Hygiene: Min guard;Sit to/from stand         General ADL Comments: needed min A to stand from chair, then min guard from 3:1.  Got dressed in bathroom.  Practiced shower transfer, but pt had difficulty conceptualizing with our accessible shower.  Placed AE bag on floor to demonstrate for her and son.  She no longer has her 3:1 commode     Vision     Perception     Praxis      Pertinent Vitals/Pain Pain Assessment: 0-10 Pain Score: 3  Pain Location: R knee Pain Descriptors / Indicators: Sore Pain Intervention(s): Limited activity within patient's tolerance;Monitored during session;Premedicated before session;Repositioned;Ice applied     Hand Dominance     Extremity/Trunk Assessment             Communication     Cognition Arousal/Alertness: Awake/alert Behavior During Therapy: WFL for tasks assessed/performed Overall Cognitive Status: Within Functional Limits for tasks assessed                     General Comments       Exercises      Shoulder Instructions      Home Living                                          Prior Functioning/Environment  OT Diagnosis:     OT Problem List:     OT Treatment/Interventions:      OT Goals(Current goals can be found in the care plan section) Acute Rehab OT Goals Patient Stated Goal: return home  OT Frequency:     Barriers to D/C:            Co-evaluation              End of Session CPM Right Knee CPM Right Knee: Off  Activity Tolerance: Patient tolerated treatment well Patient left: in chair;with call bell/phone within reach;with family/visitor present   Time: [id number] OT Time Calculation (min): 28 min Charges:  OT General Charges $OT Visit: 1 Procedure OT Evaluation $OT Eval Low Complexity: 1 Procedure OT Treatments $Self Care/Home Management : 8-22 mins G-Codes:    Glenyce Randle 02-17-2016,  12:15 PM  Lesle Chris, OTR/L [phone] 02/17/2016

## 2016-02-03 NOTE — Progress Notes (Signed)
Patient discharged to home, discharge instructions given, patient states she understands, prescriptions given.

## 2016-02-03 NOTE — Progress Notes (Signed)
Orthopedic Tech Progress Note Patient Details:  [name] [birth year] [id number]  Patient ID: [name], female   DOB: 1934/10/03, 81 y.o.   MRN: [id number] Applied cpm 0-80  Karolee Stamps 02/03/2016, 6:20 AM

## 2016-02-03 NOTE — Discharge Summary (Signed)
Patient ID: [name] MRN: [id number] DOB/AGE: Oct 18, 1934 81 y.o.  Admit date: 02/01/2016 Discharge date: 02/03/2016  Admission Diagnoses:  Principal Problem:   Primary localized osteoarthritis of right knee Active Problems:   AI (aortic insufficiency)   Diabetes (HCC)   HTN (hypertension)   Murmur, cardiac   TIA (transient ischemic attack)   Postoperative anemia due to acute blood loss   Enterococcus UTI   Discharge Diagnoses:  Same  Past Medical History:  Diagnosis Date  . Cramps, extremity    legs and hands  . Diabetes ([place])    type II  . Enterococcus UTI 02/03/2016  . H/O cardiovascular stress test 03/05/2009   normal, no ischemia  . H/O Doppler ultrasound    lower ext arterial doppler 04/13/09-no evidence of arterial insufficiency, normal values; distal aorta 2.4x2.7  . HTN (hypertension)   . Murmur, cardiac    echo 02/25/13- EF 60-65%, impaired relaxation-grade 1 diastolic dysfunction, mild concentric lvh, mild to moderate aortic regurgitation  . Pneumonia    May 2017  . Postoperative anemia due to acute blood loss 02/02/2016  . Primary localized osteoarthritis of right knee   . TIA (transient ischemic attack)    carotid doppler 05/08/11-normal patency    Surgeries: Procedure(s): TOTAL KNEE ARTHROPLASTY on 02/01/2016   Consultants:   Discharged Condition: Improved  Hospital Course: [name] is an 81 y.o. female who was admitted 02/01/2016 for operative treatment ofPrimary localized osteoarthritis of right knee. Patient has severe unremitting pain that affects sleep, daily activities, and work/hobbies. After pre-op clearance the patient was taken to the operating room on 02/01/2016 and underwent  Procedure(s): TOTAL KNEE ARTHROPLASTY.    Patient was given perioperative antibiotics:  Anti-infectives    Start     Dose/Rate Route Frequency Ordered Stop   02/04/16 1000  levofloxacin (LEVAQUIN) tablet 250 mg     250 mg Oral Daily 02/03/16 1044      02/03/16 1045  levofloxacin (LEVAQUIN) tablet 500 mg     500 mg Oral Once 02/03/16 1039     02/03/16 0000  levofloxacin (LEVAQUIN) 500 MG tablet        02/03/16 1043     02/01/16 1800  ceFAZolin (ANCEF) IVPB 2g/100 mL premix     2 g 200 mL/hr over 30 Minutes Intravenous Every 6 hours 02/01/16 1708 02/02/16 0133   02/01/16 0905  ceFAZolin (ANCEF) IVPB 2g/100 mL premix     2 g 200 mL/hr over 30 Minutes Intravenous On call to O.R. 02/01/16 0905 02/01/16 1225       Patient was given sequential compression devices, early ambulation, and chemoprophylaxis to prevent DVT.  Patient benefited maximally from hospital stay and there were no complications.    Recent vital signs:  Patient Vitals for the past 24 hrs:  BP Temp Temp src Pulse Resp SpO2  02/03/16 0939 (!) 140/53 - - (!) 55 - -  02/03/16 0450 129/65 98 F (36.7 C) Oral (!) 55 18 95 %  02/02/16 2035 132/70 98.2 F (36.8 C) Oral 64 16 94 %  02/02/16 1819 (!) 129/58 98.1 F (36.7 C) Oral 63 15 99 %  02/02/16 1539 135/67 98.2 F (36.8 C) Oral (!) 55 16 99 %  02/02/16 1456 126/66 98.2 F (36.8 C) Oral (!) 50 15 98 %  02/02/16 1400 125/69 98.3 F (36.8 C) Oral (!) 58 16 99 %  02/02/16 1105 134/68 98.2 F (36.8 C) Oral (!) 57 16 97 %     Recent  laboratory studies:   Recent Labs  02/02/16 0633 02/02/16 2302 02/03/16 0600  WBC 9.1  --  12.7*  HGB 8.6* 10.7* 10.2*  HCT 25.6* 31.5* 30.2*  PLT 160  --  130*  NA 137  --  141  K 4.3  --  4.0  CL 107  --  107  CO2 21*  --  24  BUN 30*  --  35*  CREATININE 1.36*  --  1.19*  GLUCOSE 177*  --  174*  CALCIUM 8.5*  --  9.2     Discharge Medications:     Medication List    STOP taking these medications   aspirin 81 MG tablet Replaced by:  aspirin 325 MG EC tablet     TAKE these medications   acetaminophen 325 MG tablet Commonly known as:  TYLENOL Take 2 tablets (650 mg total) by mouth every 6 (six) hours as needed for mild pain (or Fever >/= 101).   aspirin 325 MG  EC tablet 1 tab a day for the next 30 days to prevent blood clots Replaces:  aspirin 81 MG tablet   cyclobenzaprine 10 MG tablet Commonly known as:  FLEXERIL Take 5-10 mg by mouth 3 (three) times daily as needed for muscle spasms.   diclofenac 75 MG EC tablet Commonly known as:  VOLTAREN Take 75 mg by mouth 2 (two) times daily.   docusate sodium 100 MG capsule Commonly known as:  COLACE 1 tab 2 times a day while on narcotics.  STOOL SOFTENER   folic acid 1 MG tablet Commonly known as:  FOLVITE Take 1 mg by mouth daily.   glipiZIDE 5 MG 24 hr tablet Commonly known as:  GLUCOTROL XL Take 5 mg by mouth daily.   HYDROmorphone 2 MG tablet Commonly known as:  DILAUDID 1-2 tablets every 4-6 hrs as needed for pain   levofloxacin 500 MG tablet Commonly known as:  LEVAQUIN 1 tablet daily for UTI and redness on leg.  ANTIBIOTIC   metFORMIN 500 MG tablet Commonly known as:  GLUCOPHAGE Take 500 mg by mouth daily with breakfast.   metoprolol 50 MG tablet Commonly known as:  LOPRESSOR Take 50 mg by mouth 2 (two) times daily.   polyethylene glycol packet Commonly known as:  MIRALAX / GLYCOLAX 17grams in 6 oz of water twice a day until bowel movement.  LAXITIVE.  Restart if two days since last bowel movement   valsartan-hydrochlorothiazide 320-12.5 MG tablet Commonly known as:  DIOVAN-HCT Take 1 tablet by mouth daily.   VICTOZA 18 MG/3ML Sopn Generic drug:  Liraglutide Inject 3.6 mg into the skin daily as needed (sugar >120).       Diagnostic Studies: No results found.  Disposition: Final discharge disposition not confirmed  Discharge Instructions    CPM    Complete by:  As directed   Continuous passive motion machine (CPM):      Use the CPM from 0 to 90 for 6 hours per day.       You may break it up into 2 or 3 sessions per day.      Use CPM for 2 weeks or until you are told to stop.   Call MD / Call 911    Complete by:  As directed   If you experience chest pain or  shortness of breath, CALL 911 and be transported to the hospital emergency room.  If you develope a fever above 101 F, pus (white drainage) or increased drainage or redness  at the wound, or calf pain, call your surgeon's office.   Change dressing    Complete by:  As directed   Change the gauze dressing daily with sterile 4 x 4 inch gauze and apply TED hose.  DO NOT REMOVE BANDAGE OVER SURGICAL INCISION.  Wilson WHOLE LEG INCLUDING OVER THE WATERPROOF BANDAGE WITH SOAP AND WATER EVERY DAY.   Constipation Prevention    Complete by:  As directed   Drink plenty of fluids.  Prune juice may be helpful.  You may use a stool softener, such as Colace (over the counter) 100 mg twice a day.  Use MiraLax (over the counter) for constipation as needed.   Diet - low sodium heart healthy    Complete by:  As directed   Discharge instructions    Complete by:  As directed   INSTRUCTIONS AFTER JOINT REPLACEMENT   Remove items at home which could result in a fall. This includes throw rugs or furniture in walking pathways ICE to the affected joint every three hours while awake for 30 minutes at a time, for at least the first 3-5 days, and then as needed for pain and swelling.  Continue to use ice for pain and swelling. You may notice swelling that will progress down to the foot and ankle.  This is normal after surgery.  Elevate your leg when you are not up walking on it.   Continue to use the breathing machine you got in the hospital (incentive spirometer) which will help keep your temperature down.  It is common for your temperature to cycle up and down following surgery, especially at night when you are not up moving around and exerting yourself.  The breathing machine keeps your lungs expanded and your temperature down.   DIET:  As you were doing prior to hospitalization, we recommend a well-balanced diet.  DRESSING / WOUND CARE / SHOWERING  Keep the surgical dressing until follow up.  The dressing is water proof, so  you can shower without any extra covering.  IF THE DRESSING FALLS OFF or the wound gets wet inside, change the dressing with sterile gauze.  Please use good hand washing techniques before changing the dressing.  Do not use any lotions or creams on the incision until instructed by your surgeon.    ACTIVITY  Increase activity slowly as tolerated, but follow the weight bearing instructions below.   No driving for 6 weeks or until further direction given by your physician.  You cannot drive while taking narcotics.  No lifting or carrying greater than 10 lbs. until further directed by your surgeon. Avoid periods of inactivity such as sitting longer than an hour when not asleep. This helps prevent blood clots.  You may return to work once you are authorized by your doctor.     WEIGHT BEARING   Weight bearing as tolerated with assist device (walker, cane, etc) as directed, use it as long as suggested by your surgeon or therapist, typically at least 2-3 weeks.   EXERCISES  Results after joint replacement surgery are often greatly improved when you follow the exercise, range of motion and muscle strengthening exercises prescribed by your doctor. Safety measures are also important to protect the joint from further injury. Any time any of these exercises cause you to have increased pain or swelling, decrease what you are doing until you are comfortable again and then slowly increase them. If you have problems or questions, call your caregiver or physical therapist for advice.  Rehabilitation is important following a joint replacement. After just a few days of immobilization, the muscles of the leg can become weakened and shrink (atrophy).  These exercises are designed to build up the tone and strength of the thigh and leg muscles and to improve motion. Often times heat used for twenty to thirty minutes before working out will loosen up your tissues and help with improving the range of motion but do not  use heat for the first two weeks following surgery (sometimes heat can increase post-operative swelling).   These exercises can be done on a training (exercise) mat, on the floor, on a table or on a bed. Use whatever works the best and is most comfortable for you.    Use music or television while you are exercising so that the exercises are a pleasant break in your day. This will make your life better with the exercises acting as a break in your routine that you can look forward to.   Perform all exercises about fifteen times, three times per day or as directed.  You should exercise both the operative leg and the other leg as well.   Exercises include:  Quad Sets - Tighten up the muscle on the front of the thigh (Quad) and hold for 5-10 seconds.   Straight Leg Raises - With your knee straight (if you were given a brace, keep it on), lift the leg to 60 degrees, hold for 3 seconds, and slowly lower the leg.  Perform this exercise against resistance later as your leg gets stronger.  Leg Slides: Lying on your back, slowly slide your foot toward your buttocks, bending your knee up off the floor (only go as far as is comfortable). Then slowly slide your foot back down until your leg is flat on the floor again.  Angel Wings: Lying on your back spread your legs to the side as far apart as you can without causing discomfort.  Hamstring Strength:  Lying on your back, push your heel against the floor with your leg straight by tightening up the muscles of your buttocks.  Repeat, but this time bend your knee to a comfortable angle, and push your heel against the floor.  You may put a pillow under the heel to make it more comfortable if necessary.   A rehabilitation program following joint replacement surgery can speed recovery and prevent re-injury in the future due to weakened muscles. Contact your doctor or a physical therapist for more information on knee rehabilitation.    CONSTIPATION  Constipation is  defined medically as fewer than three stools per week and severe constipation as less than one stool per week.  Even if you have a regular bowel pattern at home, your normal regimen is likely to be disrupted due to multiple reasons following surgery.  Combination of anesthesia, postoperative narcotics, change in appetite and fluid intake all can affect your bowels.   YOU MUST use at least one of the following options; they are listed in order of increasing strength to get the job done.  They are all available over the counter, and you may need to use some, POSSIBLY even all of these options:    Drink plenty of fluids (prune juice may be helpful) and high fiber foods Colace 100 mg by mouth twice a day  Senokot for constipation as directed and as needed Dulcolax (bisacodyl), take with full glass of water  Miralax (polyethylene glycol) once or twice a day as needed.  If you have  tried all these things and are unable to have a bowel movement in the first 3-4 days after surgery call either your surgeon or your primary doctor.    If you experience loose stools or diarrhea, hold the medications until you stool forms back up.  If your symptoms do not get better within 1 week or if they get worse, check with your doctor.  If you experience "the worst abdominal pain ever" or develop nausea or vomiting, please contact the office immediately for further recommendations for treatment.   ITCHING:  If you experience itching with your medications, try taking only a single pain pill, or even half a pain pill at a time.  You can also use Benadryl over the counter for itching or also to help with sleep.   TED HOSE STOCKINGS:  Use stockings on both legs until for at least 2 weeks or as directed by physician office. They may be removed at night for sleeping.  MEDICATIONS:  See your medication summary on the "After Visit Summary" that nursing will review with you.  You may have some home medications which will be placed  on hold until you complete the course of blood thinner medication.  It is important for you to complete the blood thinner medication as prescribed.  PRECAUTIONS:  If you experience chest pain or shortness of breath - call 911 immediately for transfer to the hospital emergency department.   If you develop a fever greater that 101 F, purulent drainage from wound, increased redness or drainage from wound, foul odor from the wound/dressing, or calf pain - CONTACT YOUR SURGEON.                                                   FOLLOW-UP APPOINTMENTS:  If you do not already have a post-op appointment, please call the office for an appointment to be seen by your surgeon.  Guidelines for how soon to be seen are listed in your "After Visit Summary", but are typically between 1-4 weeks after surgery.  OTHER INSTRUCTIONS:   Knee Replacement:  Do not place pillow under knee, focus on keeping the knee straight while resting. CPM instructions: 0-90 degrees, 2 hours in the morning, 2 hours in the afternoon, and 2 hours in the evening. Place foam block, curve side up under heel at all times except when in CPM or when walking.  DO NOT modify, tear, cut, or change the foam block in any way.  MAKE SURE YOU:  Understand these instructions.  Get help right away if you are not doing well or get worse.    Thank you for letting us be a part of your medical care team.  It is a privilege we respect greatly.  We hope these instructions will help you stay on track for a fast and full recovery!   Do not put a pillow under the knee. Place it under the heel.    Complete by:  As directed   Place gray foam block, curve side up under heel at all times except when in CPM or when walking.  DO NOT modify, tear, cut, or change in any way the gray foam block.   Increase activity slowly as tolerated    Complete by:  As directed   Patient may shower    Complete by:  As directed  Aquacel dressing is water proof    Wash over it and the  whole leg with soap and water at the end of your shower   TED hose    Complete by:  As directed   Use stockings (TED hose) for 2 weeks on both leg(s).  You may remove them at night for sleeping.      Follow-up Information    Lorn Junes, MD Follow up on 02/15/2016.   Specialty:  Orthopedic Surgery Why:  appt time 2:45pm Contact information: [address] [place] [address] [phone]            Signed: Linda Hedges 02/03/2016, 10:56 AM

## 2016-02-04 DIAGNOSIS — Z792 Long term (current) use of antibiotics: Secondary | ICD-10-CM | POA: Diagnosis not present

## 2016-02-04 DIAGNOSIS — Z7982 Long term (current) use of aspirin: Secondary | ICD-10-CM | POA: Diagnosis not present

## 2016-02-04 DIAGNOSIS — Z471 Aftercare following joint replacement surgery: Secondary | ICD-10-CM | POA: Diagnosis not present

## 2016-02-04 DIAGNOSIS — Z8673 Personal history of transient ischemic attack (TIA), and cerebral infarction without residual deficits: Secondary | ICD-10-CM | POA: Diagnosis not present

## 2016-02-04 DIAGNOSIS — I10 Essential (primary) hypertension: Secondary | ICD-10-CM | POA: Diagnosis not present

## 2016-02-04 DIAGNOSIS — Z96651 Presence of right artificial knee joint: Secondary | ICD-10-CM | POA: Diagnosis not present

## 2016-02-04 DIAGNOSIS — M1991 Primary osteoarthritis, unspecified site: Secondary | ICD-10-CM | POA: Diagnosis not present

## 2016-02-04 DIAGNOSIS — I351 Nonrheumatic aortic (valve) insufficiency: Secondary | ICD-10-CM | POA: Diagnosis not present

## 2016-02-04 DIAGNOSIS — N390 Urinary tract infection, site not specified: Secondary | ICD-10-CM | POA: Diagnosis not present

## 2016-02-04 DIAGNOSIS — E119 Type 2 diabetes mellitus without complications: Secondary | ICD-10-CM | POA: Diagnosis not present

## 2016-02-04 DIAGNOSIS — Z791 Long term (current) use of non-steroidal anti-inflammatories (NSAID): Secondary | ICD-10-CM | POA: Diagnosis not present

## 2016-02-04 DIAGNOSIS — Z9181 History of falling: Secondary | ICD-10-CM | POA: Diagnosis not present

## 2016-02-04 DIAGNOSIS — Z7984 Long term (current) use of oral hypoglycemic drugs: Secondary | ICD-10-CM | POA: Diagnosis not present

## 2016-02-04 DIAGNOSIS — B952 Enterococcus as the cause of diseases classified elsewhere: Secondary | ICD-10-CM | POA: Diagnosis not present

## 2016-02-05 DIAGNOSIS — N390 Urinary tract infection, site not specified: Secondary | ICD-10-CM | POA: Diagnosis not present

## 2016-02-05 DIAGNOSIS — B952 Enterococcus as the cause of diseases classified elsewhere: Secondary | ICD-10-CM | POA: Diagnosis not present

## 2016-02-05 DIAGNOSIS — I351 Nonrheumatic aortic (valve) insufficiency: Secondary | ICD-10-CM | POA: Diagnosis not present

## 2016-02-05 DIAGNOSIS — Z471 Aftercare following joint replacement surgery: Secondary | ICD-10-CM | POA: Diagnosis not present

## 2016-02-05 DIAGNOSIS — E119 Type 2 diabetes mellitus without complications: Secondary | ICD-10-CM | POA: Diagnosis not present

## 2016-02-05 DIAGNOSIS — I10 Essential (primary) hypertension: Secondary | ICD-10-CM | POA: Diagnosis not present

## 2016-02-08 DIAGNOSIS — I10 Essential (primary) hypertension: Secondary | ICD-10-CM | POA: Diagnosis not present

## 2016-02-08 DIAGNOSIS — B952 Enterococcus as the cause of diseases classified elsewhere: Secondary | ICD-10-CM | POA: Diagnosis not present

## 2016-02-08 DIAGNOSIS — N390 Urinary tract infection, site not specified: Secondary | ICD-10-CM | POA: Diagnosis not present

## 2016-02-08 DIAGNOSIS — I351 Nonrheumatic aortic (valve) insufficiency: Secondary | ICD-10-CM | POA: Diagnosis not present

## 2016-02-08 DIAGNOSIS — Z471 Aftercare following joint replacement surgery: Secondary | ICD-10-CM | POA: Diagnosis not present

## 2016-02-08 DIAGNOSIS — E119 Type 2 diabetes mellitus without complications: Secondary | ICD-10-CM | POA: Diagnosis not present

## 2016-02-09 DIAGNOSIS — B952 Enterococcus as the cause of diseases classified elsewhere: Secondary | ICD-10-CM | POA: Diagnosis not present

## 2016-02-09 DIAGNOSIS — Z471 Aftercare following joint replacement surgery: Secondary | ICD-10-CM | POA: Diagnosis not present

## 2016-02-09 DIAGNOSIS — E119 Type 2 diabetes mellitus without complications: Secondary | ICD-10-CM | POA: Diagnosis not present

## 2016-02-09 DIAGNOSIS — I351 Nonrheumatic aortic (valve) insufficiency: Secondary | ICD-10-CM | POA: Diagnosis not present

## 2016-02-09 DIAGNOSIS — N390 Urinary tract infection, site not specified: Secondary | ICD-10-CM | POA: Diagnosis not present

## 2016-02-09 DIAGNOSIS — I10 Essential (primary) hypertension: Secondary | ICD-10-CM | POA: Diagnosis not present

## 2016-02-11 DIAGNOSIS — I10 Essential (primary) hypertension: Secondary | ICD-10-CM | POA: Diagnosis not present

## 2016-02-11 DIAGNOSIS — Z471 Aftercare following joint replacement surgery: Secondary | ICD-10-CM | POA: Diagnosis not present

## 2016-02-11 DIAGNOSIS — N390 Urinary tract infection, site not specified: Secondary | ICD-10-CM | POA: Diagnosis not present

## 2016-02-11 DIAGNOSIS — E119 Type 2 diabetes mellitus without complications: Secondary | ICD-10-CM | POA: Diagnosis not present

## 2016-02-11 DIAGNOSIS — B952 Enterococcus as the cause of diseases classified elsewhere: Secondary | ICD-10-CM | POA: Diagnosis not present

## 2016-02-11 DIAGNOSIS — I351 Nonrheumatic aortic (valve) insufficiency: Secondary | ICD-10-CM | POA: Diagnosis not present

## 2016-02-12 DIAGNOSIS — Z96651 Presence of right artificial knee joint: Secondary | ICD-10-CM | POA: Diagnosis not present

## 2016-02-12 DIAGNOSIS — Z471 Aftercare following joint replacement surgery: Secondary | ICD-10-CM | POA: Diagnosis not present

## 2016-02-12 DIAGNOSIS — R2689 Other abnormalities of gait and mobility: Secondary | ICD-10-CM | POA: Diagnosis not present

## 2016-02-12 DIAGNOSIS — M6281 Muscle weakness (generalized): Secondary | ICD-10-CM | POA: Diagnosis not present

## 2016-02-15 DIAGNOSIS — M6281 Muscle weakness (generalized): Secondary | ICD-10-CM | POA: Diagnosis not present

## 2016-02-15 DIAGNOSIS — Z96651 Presence of right artificial knee joint: Secondary | ICD-10-CM | POA: Diagnosis not present

## 2016-02-15 DIAGNOSIS — Z471 Aftercare following joint replacement surgery: Secondary | ICD-10-CM | POA: Diagnosis not present

## 2016-02-15 DIAGNOSIS — R2689 Other abnormalities of gait and mobility: Secondary | ICD-10-CM | POA: Diagnosis not present

## 2016-02-16 DIAGNOSIS — M1711 Unilateral primary osteoarthritis, right knee: Secondary | ICD-10-CM | POA: Diagnosis not present

## 2016-02-17 DIAGNOSIS — Z96651 Presence of right artificial knee joint: Secondary | ICD-10-CM | POA: Diagnosis not present

## 2016-02-17 DIAGNOSIS — R2689 Other abnormalities of gait and mobility: Secondary | ICD-10-CM | POA: Diagnosis not present

## 2016-02-17 DIAGNOSIS — M6281 Muscle weakness (generalized): Secondary | ICD-10-CM | POA: Diagnosis not present

## 2016-02-17 DIAGNOSIS — Z471 Aftercare following joint replacement surgery: Secondary | ICD-10-CM | POA: Diagnosis not present

## 2016-02-22 DIAGNOSIS — M6281 Muscle weakness (generalized): Secondary | ICD-10-CM | POA: Diagnosis not present

## 2016-02-22 DIAGNOSIS — Z96651 Presence of right artificial knee joint: Secondary | ICD-10-CM | POA: Diagnosis not present

## 2016-02-22 DIAGNOSIS — R2689 Other abnormalities of gait and mobility: Secondary | ICD-10-CM | POA: Diagnosis not present

## 2016-02-22 DIAGNOSIS — Z471 Aftercare following joint replacement surgery: Secondary | ICD-10-CM | POA: Diagnosis not present

## 2016-02-24 DIAGNOSIS — R2689 Other abnormalities of gait and mobility: Secondary | ICD-10-CM | POA: Diagnosis not present

## 2016-02-24 DIAGNOSIS — M6281 Muscle weakness (generalized): Secondary | ICD-10-CM | POA: Diagnosis not present

## 2016-02-24 DIAGNOSIS — Z471 Aftercare following joint replacement surgery: Secondary | ICD-10-CM | POA: Diagnosis not present

## 2016-02-24 DIAGNOSIS — Z96651 Presence of right artificial knee joint: Secondary | ICD-10-CM | POA: Diagnosis not present

## 2016-02-29 DIAGNOSIS — Z471 Aftercare following joint replacement surgery: Secondary | ICD-10-CM | POA: Diagnosis not present

## 2016-02-29 DIAGNOSIS — Z96651 Presence of right artificial knee joint: Secondary | ICD-10-CM | POA: Diagnosis not present

## 2016-02-29 DIAGNOSIS — R2689 Other abnormalities of gait and mobility: Secondary | ICD-10-CM | POA: Diagnosis not present

## 2016-02-29 DIAGNOSIS — M6281 Muscle weakness (generalized): Secondary | ICD-10-CM | POA: Diagnosis not present

## 2016-03-01 DIAGNOSIS — M1711 Unilateral primary osteoarthritis, right knee: Secondary | ICD-10-CM | POA: Diagnosis not present

## 2016-03-02 DIAGNOSIS — Z471 Aftercare following joint replacement surgery: Secondary | ICD-10-CM | POA: Diagnosis not present

## 2016-03-02 DIAGNOSIS — M6281 Muscle weakness (generalized): Secondary | ICD-10-CM | POA: Diagnosis not present

## 2016-03-02 DIAGNOSIS — R2689 Other abnormalities of gait and mobility: Secondary | ICD-10-CM | POA: Diagnosis not present

## 2016-03-02 DIAGNOSIS — Z96651 Presence of right artificial knee joint: Secondary | ICD-10-CM | POA: Diagnosis not present

## 2016-03-07 DIAGNOSIS — M6281 Muscle weakness (generalized): Secondary | ICD-10-CM | POA: Diagnosis not present

## 2016-03-07 DIAGNOSIS — Z471 Aftercare following joint replacement surgery: Secondary | ICD-10-CM | POA: Diagnosis not present

## 2016-03-07 DIAGNOSIS — Z96651 Presence of right artificial knee joint: Secondary | ICD-10-CM | POA: Diagnosis not present

## 2016-03-07 DIAGNOSIS — R2689 Other abnormalities of gait and mobility: Secondary | ICD-10-CM | POA: Diagnosis not present

## 2016-03-09 DIAGNOSIS — Z471 Aftercare following joint replacement surgery: Secondary | ICD-10-CM | POA: Diagnosis not present

## 2016-03-09 DIAGNOSIS — R2689 Other abnormalities of gait and mobility: Secondary | ICD-10-CM | POA: Diagnosis not present

## 2016-03-09 DIAGNOSIS — Z96651 Presence of right artificial knee joint: Secondary | ICD-10-CM | POA: Diagnosis not present

## 2016-03-09 DIAGNOSIS — M6281 Muscle weakness (generalized): Secondary | ICD-10-CM | POA: Diagnosis not present

## 2016-03-10 DIAGNOSIS — E1142 Type 2 diabetes mellitus with diabetic polyneuropathy: Secondary | ICD-10-CM | POA: Diagnosis not present

## 2016-03-10 DIAGNOSIS — Z713 Dietary counseling and surveillance: Secondary | ICD-10-CM | POA: Diagnosis not present

## 2016-03-10 DIAGNOSIS — Z6827 Body mass index (BMI) 27.0-27.9, adult: Secondary | ICD-10-CM | POA: Diagnosis not present

## 2016-03-10 DIAGNOSIS — I10 Essential (primary) hypertension: Secondary | ICD-10-CM | POA: Diagnosis not present

## 2016-03-16 DIAGNOSIS — Z471 Aftercare following joint replacement surgery: Secondary | ICD-10-CM | POA: Diagnosis not present

## 2016-03-16 DIAGNOSIS — M6281 Muscle weakness (generalized): Secondary | ICD-10-CM | POA: Diagnosis not present

## 2016-03-16 DIAGNOSIS — R2689 Other abnormalities of gait and mobility: Secondary | ICD-10-CM | POA: Diagnosis not present

## 2016-03-16 DIAGNOSIS — Z96651 Presence of right artificial knee joint: Secondary | ICD-10-CM | POA: Diagnosis not present

## 2016-03-18 DIAGNOSIS — R2689 Other abnormalities of gait and mobility: Secondary | ICD-10-CM | POA: Diagnosis not present

## 2016-03-18 DIAGNOSIS — Z471 Aftercare following joint replacement surgery: Secondary | ICD-10-CM | POA: Diagnosis not present

## 2016-03-18 DIAGNOSIS — Z96651 Presence of right artificial knee joint: Secondary | ICD-10-CM | POA: Diagnosis not present

## 2016-03-18 DIAGNOSIS — M6281 Muscle weakness (generalized): Secondary | ICD-10-CM | POA: Diagnosis not present

## 2016-03-21 DIAGNOSIS — Z471 Aftercare following joint replacement surgery: Secondary | ICD-10-CM | POA: Diagnosis not present

## 2016-03-21 DIAGNOSIS — R2689 Other abnormalities of gait and mobility: Secondary | ICD-10-CM | POA: Diagnosis not present

## 2016-03-21 DIAGNOSIS — M6281 Muscle weakness (generalized): Secondary | ICD-10-CM | POA: Diagnosis not present

## 2016-03-21 DIAGNOSIS — Z96651 Presence of right artificial knee joint: Secondary | ICD-10-CM | POA: Diagnosis not present

## 2016-03-28 DIAGNOSIS — Z96651 Presence of right artificial knee joint: Secondary | ICD-10-CM | POA: Diagnosis not present

## 2016-03-28 DIAGNOSIS — R2689 Other abnormalities of gait and mobility: Secondary | ICD-10-CM | POA: Diagnosis not present

## 2016-03-28 DIAGNOSIS — Z471 Aftercare following joint replacement surgery: Secondary | ICD-10-CM | POA: Diagnosis not present

## 2016-03-28 DIAGNOSIS — M6281 Muscle weakness (generalized): Secondary | ICD-10-CM | POA: Diagnosis not present

## 2016-03-29 DIAGNOSIS — E1140 Type 2 diabetes mellitus with diabetic neuropathy, unspecified: Secondary | ICD-10-CM | POA: Diagnosis not present

## 2016-03-29 DIAGNOSIS — E1151 Type 2 diabetes mellitus with diabetic peripheral angiopathy without gangrene: Secondary | ICD-10-CM | POA: Diagnosis not present

## 2016-03-30 DIAGNOSIS — M6281 Muscle weakness (generalized): Secondary | ICD-10-CM | POA: Diagnosis not present

## 2016-03-30 DIAGNOSIS — Z96651 Presence of right artificial knee joint: Secondary | ICD-10-CM | POA: Diagnosis not present

## 2016-03-30 DIAGNOSIS — Z471 Aftercare following joint replacement surgery: Secondary | ICD-10-CM | POA: Diagnosis not present

## 2016-03-30 DIAGNOSIS — R2689 Other abnormalities of gait and mobility: Secondary | ICD-10-CM | POA: Diagnosis not present

## 2016-04-04 DIAGNOSIS — R2689 Other abnormalities of gait and mobility: Secondary | ICD-10-CM | POA: Diagnosis not present

## 2016-04-04 DIAGNOSIS — Z96651 Presence of right artificial knee joint: Secondary | ICD-10-CM | POA: Diagnosis not present

## 2016-04-04 DIAGNOSIS — M6281 Muscle weakness (generalized): Secondary | ICD-10-CM | POA: Diagnosis not present

## 2016-04-04 DIAGNOSIS — Z471 Aftercare following joint replacement surgery: Secondary | ICD-10-CM | POA: Diagnosis not present

## 2016-04-05 DIAGNOSIS — M170 Bilateral primary osteoarthritis of knee: Secondary | ICD-10-CM | POA: Diagnosis not present

## 2016-04-06 DIAGNOSIS — E119 Type 2 diabetes mellitus without complications: Secondary | ICD-10-CM | POA: Diagnosis not present

## 2016-04-06 DIAGNOSIS — I10 Essential (primary) hypertension: Secondary | ICD-10-CM | POA: Diagnosis not present

## 2016-04-06 DIAGNOSIS — Z471 Aftercare following joint replacement surgery: Secondary | ICD-10-CM | POA: Diagnosis not present

## 2016-04-06 DIAGNOSIS — M6281 Muscle weakness (generalized): Secondary | ICD-10-CM | POA: Diagnosis not present

## 2016-04-06 DIAGNOSIS — Z96651 Presence of right artificial knee joint: Secondary | ICD-10-CM | POA: Diagnosis not present

## 2016-04-06 DIAGNOSIS — R2689 Other abnormalities of gait and mobility: Secondary | ICD-10-CM | POA: Diagnosis not present

## 2016-04-12 DIAGNOSIS — Z471 Aftercare following joint replacement surgery: Secondary | ICD-10-CM | POA: Diagnosis not present

## 2016-04-12 DIAGNOSIS — M6281 Muscle weakness (generalized): Secondary | ICD-10-CM | POA: Diagnosis not present

## 2016-04-12 DIAGNOSIS — Z96651 Presence of right artificial knee joint: Secondary | ICD-10-CM | POA: Diagnosis not present

## 2016-04-12 DIAGNOSIS — R2689 Other abnormalities of gait and mobility: Secondary | ICD-10-CM | POA: Diagnosis not present

## 2016-05-07 DIAGNOSIS — Z23 Encounter for immunization: Secondary | ICD-10-CM | POA: Diagnosis not present

## 2016-05-18 DIAGNOSIS — I10 Essential (primary) hypertension: Secondary | ICD-10-CM | POA: Diagnosis not present

## 2016-05-18 DIAGNOSIS — E119 Type 2 diabetes mellitus without complications: Secondary | ICD-10-CM | POA: Diagnosis not present

## 2016-05-22 DIAGNOSIS — I10 Essential (primary) hypertension: Secondary | ICD-10-CM | POA: Diagnosis not present

## 2016-05-22 DIAGNOSIS — S4991XA Unspecified injury of right shoulder and upper arm, initial encounter: Secondary | ICD-10-CM | POA: Diagnosis not present

## 2016-05-22 DIAGNOSIS — S81011A Laceration without foreign body, right knee, initial encounter: Secondary | ICD-10-CM | POA: Diagnosis not present

## 2016-05-22 DIAGNOSIS — S40011A Contusion of right shoulder, initial encounter: Secondary | ICD-10-CM | POA: Diagnosis not present

## 2016-05-22 DIAGNOSIS — M25511 Pain in right shoulder: Secondary | ICD-10-CM | POA: Diagnosis not present

## 2016-05-22 DIAGNOSIS — S8991XA Unspecified injury of right lower leg, initial encounter: Secondary | ICD-10-CM | POA: Diagnosis not present

## 2016-05-22 DIAGNOSIS — Z79899 Other long term (current) drug therapy: Secondary | ICD-10-CM | POA: Diagnosis not present

## 2016-05-22 DIAGNOSIS — Z7984 Long term (current) use of oral hypoglycemic drugs: Secondary | ICD-10-CM | POA: Diagnosis not present

## 2016-05-22 DIAGNOSIS — Z96651 Presence of right artificial knee joint: Secondary | ICD-10-CM | POA: Diagnosis not present

## 2016-05-22 DIAGNOSIS — W1839XA Other fall on same level, initial encounter: Secondary | ICD-10-CM | POA: Diagnosis not present

## 2016-05-22 DIAGNOSIS — M1990 Unspecified osteoarthritis, unspecified site: Secondary | ICD-10-CM | POA: Diagnosis not present

## 2016-05-22 DIAGNOSIS — S8001XA Contusion of right knee, initial encounter: Secondary | ICD-10-CM | POA: Diagnosis not present

## 2016-05-22 DIAGNOSIS — E119 Type 2 diabetes mellitus without complications: Secondary | ICD-10-CM | POA: Diagnosis not present

## 2016-05-24 DIAGNOSIS — Z4801 Encounter for change or removal of surgical wound dressing: Secondary | ICD-10-CM | POA: Diagnosis not present

## 2016-05-24 DIAGNOSIS — S80211D Abrasion, right knee, subsequent encounter: Secondary | ICD-10-CM | POA: Diagnosis not present

## 2016-05-24 DIAGNOSIS — S81011D Laceration without foreign body, right knee, subsequent encounter: Secondary | ICD-10-CM | POA: Diagnosis not present

## 2016-05-24 DIAGNOSIS — Z4800 Encounter for change or removal of nonsurgical wound dressing: Secondary | ICD-10-CM | POA: Diagnosis not present

## 2016-05-26 DIAGNOSIS — S81001A Unspecified open wound, right knee, initial encounter: Secondary | ICD-10-CM | POA: Diagnosis not present

## 2016-05-30 DIAGNOSIS — Z4800 Encounter for change or removal of nonsurgical wound dressing: Secondary | ICD-10-CM | POA: Diagnosis not present

## 2016-05-30 DIAGNOSIS — I10 Essential (primary) hypertension: Secondary | ICD-10-CM | POA: Diagnosis not present

## 2016-05-30 DIAGNOSIS — E119 Type 2 diabetes mellitus without complications: Secondary | ICD-10-CM | POA: Diagnosis not present

## 2016-05-30 DIAGNOSIS — W19XXXD Unspecified fall, subsequent encounter: Secondary | ICD-10-CM | POA: Diagnosis not present

## 2016-05-30 DIAGNOSIS — G2581 Restless legs syndrome: Secondary | ICD-10-CM | POA: Diagnosis not present

## 2016-05-30 DIAGNOSIS — Z7982 Long term (current) use of aspirin: Secondary | ICD-10-CM | POA: Diagnosis not present

## 2016-05-30 DIAGNOSIS — Z7984 Long term (current) use of oral hypoglycemic drugs: Secondary | ICD-10-CM | POA: Diagnosis not present

## 2016-05-30 DIAGNOSIS — S81001D Unspecified open wound, right knee, subsequent encounter: Secondary | ICD-10-CM | POA: Diagnosis not present

## 2016-05-30 DIAGNOSIS — M1990 Unspecified osteoarthritis, unspecified site: Secondary | ICD-10-CM | POA: Diagnosis not present

## 2016-05-30 DIAGNOSIS — Z96651 Presence of right artificial knee joint: Secondary | ICD-10-CM | POA: Diagnosis not present

## 2016-05-31 DIAGNOSIS — M1990 Unspecified osteoarthritis, unspecified site: Secondary | ICD-10-CM | POA: Diagnosis not present

## 2016-05-31 DIAGNOSIS — S81001D Unspecified open wound, right knee, subsequent encounter: Secondary | ICD-10-CM | POA: Diagnosis not present

## 2016-05-31 DIAGNOSIS — G2581 Restless legs syndrome: Secondary | ICD-10-CM | POA: Diagnosis not present

## 2016-05-31 DIAGNOSIS — E119 Type 2 diabetes mellitus without complications: Secondary | ICD-10-CM | POA: Diagnosis not present

## 2016-05-31 DIAGNOSIS — Z4800 Encounter for change or removal of nonsurgical wound dressing: Secondary | ICD-10-CM | POA: Diagnosis not present

## 2016-05-31 DIAGNOSIS — I10 Essential (primary) hypertension: Secondary | ICD-10-CM | POA: Diagnosis not present

## 2016-06-02 DIAGNOSIS — Z4800 Encounter for change or removal of nonsurgical wound dressing: Secondary | ICD-10-CM | POA: Diagnosis not present

## 2016-06-02 DIAGNOSIS — M1990 Unspecified osteoarthritis, unspecified site: Secondary | ICD-10-CM | POA: Diagnosis not present

## 2016-06-02 DIAGNOSIS — E119 Type 2 diabetes mellitus without complications: Secondary | ICD-10-CM | POA: Diagnosis not present

## 2016-06-02 DIAGNOSIS — S81001D Unspecified open wound, right knee, subsequent encounter: Secondary | ICD-10-CM | POA: Diagnosis not present

## 2016-06-02 DIAGNOSIS — I10 Essential (primary) hypertension: Secondary | ICD-10-CM | POA: Diagnosis not present

## 2016-06-02 DIAGNOSIS — G2581 Restless legs syndrome: Secondary | ICD-10-CM | POA: Diagnosis not present

## 2016-06-04 DIAGNOSIS — M1990 Unspecified osteoarthritis, unspecified site: Secondary | ICD-10-CM | POA: Diagnosis not present

## 2016-06-04 DIAGNOSIS — Z4800 Encounter for change or removal of nonsurgical wound dressing: Secondary | ICD-10-CM | POA: Diagnosis not present

## 2016-06-04 DIAGNOSIS — G2581 Restless legs syndrome: Secondary | ICD-10-CM | POA: Diagnosis not present

## 2016-06-04 DIAGNOSIS — E119 Type 2 diabetes mellitus without complications: Secondary | ICD-10-CM | POA: Diagnosis not present

## 2016-06-04 DIAGNOSIS — I10 Essential (primary) hypertension: Secondary | ICD-10-CM | POA: Diagnosis not present

## 2016-06-04 DIAGNOSIS — S81001D Unspecified open wound, right knee, subsequent encounter: Secondary | ICD-10-CM | POA: Diagnosis not present

## 2016-06-06 DIAGNOSIS — I10 Essential (primary) hypertension: Secondary | ICD-10-CM | POA: Diagnosis not present

## 2016-06-06 DIAGNOSIS — E119 Type 2 diabetes mellitus without complications: Secondary | ICD-10-CM | POA: Diagnosis not present

## 2016-06-06 DIAGNOSIS — G2581 Restless legs syndrome: Secondary | ICD-10-CM | POA: Diagnosis not present

## 2016-06-06 DIAGNOSIS — M1990 Unspecified osteoarthritis, unspecified site: Secondary | ICD-10-CM | POA: Diagnosis not present

## 2016-06-06 DIAGNOSIS — Z4800 Encounter for change or removal of nonsurgical wound dressing: Secondary | ICD-10-CM | POA: Diagnosis not present

## 2016-06-06 DIAGNOSIS — S81001D Unspecified open wound, right knee, subsequent encounter: Secondary | ICD-10-CM | POA: Diagnosis not present

## 2016-06-07 DIAGNOSIS — E1140 Type 2 diabetes mellitus with diabetic neuropathy, unspecified: Secondary | ICD-10-CM | POA: Diagnosis not present

## 2016-06-07 DIAGNOSIS — E1151 Type 2 diabetes mellitus with diabetic peripheral angiopathy without gangrene: Secondary | ICD-10-CM | POA: Diagnosis not present

## 2016-06-08 DIAGNOSIS — I10 Essential (primary) hypertension: Secondary | ICD-10-CM | POA: Diagnosis not present

## 2016-06-08 DIAGNOSIS — S81001D Unspecified open wound, right knee, subsequent encounter: Secondary | ICD-10-CM | POA: Diagnosis not present

## 2016-06-08 DIAGNOSIS — G2581 Restless legs syndrome: Secondary | ICD-10-CM | POA: Diagnosis not present

## 2016-06-08 DIAGNOSIS — Z4800 Encounter for change or removal of nonsurgical wound dressing: Secondary | ICD-10-CM | POA: Diagnosis not present

## 2016-06-08 DIAGNOSIS — E119 Type 2 diabetes mellitus without complications: Secondary | ICD-10-CM | POA: Diagnosis not present

## 2016-06-08 DIAGNOSIS — M1990 Unspecified osteoarthritis, unspecified site: Secondary | ICD-10-CM | POA: Diagnosis not present

## 2016-06-10 DIAGNOSIS — I10 Essential (primary) hypertension: Secondary | ICD-10-CM | POA: Diagnosis not present

## 2016-06-10 DIAGNOSIS — Z4800 Encounter for change or removal of nonsurgical wound dressing: Secondary | ICD-10-CM | POA: Diagnosis not present

## 2016-06-10 DIAGNOSIS — M1990 Unspecified osteoarthritis, unspecified site: Secondary | ICD-10-CM | POA: Diagnosis not present

## 2016-06-10 DIAGNOSIS — G2581 Restless legs syndrome: Secondary | ICD-10-CM | POA: Diagnosis not present

## 2016-06-10 DIAGNOSIS — S81001D Unspecified open wound, right knee, subsequent encounter: Secondary | ICD-10-CM | POA: Diagnosis not present

## 2016-06-10 DIAGNOSIS — E119 Type 2 diabetes mellitus without complications: Secondary | ICD-10-CM | POA: Diagnosis not present

## 2016-06-13 DIAGNOSIS — I10 Essential (primary) hypertension: Secondary | ICD-10-CM | POA: Diagnosis not present

## 2016-06-13 DIAGNOSIS — S81001D Unspecified open wound, right knee, subsequent encounter: Secondary | ICD-10-CM | POA: Diagnosis not present

## 2016-06-13 DIAGNOSIS — E1142 Type 2 diabetes mellitus with diabetic polyneuropathy: Secondary | ICD-10-CM | POA: Diagnosis not present

## 2016-06-13 DIAGNOSIS — M1990 Unspecified osteoarthritis, unspecified site: Secondary | ICD-10-CM | POA: Diagnosis not present

## 2016-06-13 DIAGNOSIS — G2581 Restless legs syndrome: Secondary | ICD-10-CM | POA: Diagnosis not present

## 2016-06-13 DIAGNOSIS — J069 Acute upper respiratory infection, unspecified: Secondary | ICD-10-CM | POA: Diagnosis not present

## 2016-06-13 DIAGNOSIS — Z299 Encounter for prophylactic measures, unspecified: Secondary | ICD-10-CM | POA: Diagnosis not present

## 2016-06-13 DIAGNOSIS — S81001A Unspecified open wound, right knee, initial encounter: Secondary | ICD-10-CM | POA: Diagnosis not present

## 2016-06-13 DIAGNOSIS — Z4800 Encounter for change or removal of nonsurgical wound dressing: Secondary | ICD-10-CM | POA: Diagnosis not present

## 2016-06-13 DIAGNOSIS — E119 Type 2 diabetes mellitus without complications: Secondary | ICD-10-CM | POA: Diagnosis not present

## 2016-06-15 DIAGNOSIS — I10 Essential (primary) hypertension: Secondary | ICD-10-CM | POA: Diagnosis not present

## 2016-06-15 DIAGNOSIS — M1990 Unspecified osteoarthritis, unspecified site: Secondary | ICD-10-CM | POA: Diagnosis not present

## 2016-06-15 DIAGNOSIS — S81001D Unspecified open wound, right knee, subsequent encounter: Secondary | ICD-10-CM | POA: Diagnosis not present

## 2016-06-15 DIAGNOSIS — Z4800 Encounter for change or removal of nonsurgical wound dressing: Secondary | ICD-10-CM | POA: Diagnosis not present

## 2016-06-15 DIAGNOSIS — E119 Type 2 diabetes mellitus without complications: Secondary | ICD-10-CM | POA: Diagnosis not present

## 2016-06-15 DIAGNOSIS — G2581 Restless legs syndrome: Secondary | ICD-10-CM | POA: Diagnosis not present

## 2016-06-16 DIAGNOSIS — Z4800 Encounter for change or removal of nonsurgical wound dressing: Secondary | ICD-10-CM | POA: Diagnosis not present

## 2016-06-16 DIAGNOSIS — E119 Type 2 diabetes mellitus without complications: Secondary | ICD-10-CM | POA: Diagnosis not present

## 2016-06-16 DIAGNOSIS — S81001D Unspecified open wound, right knee, subsequent encounter: Secondary | ICD-10-CM | POA: Diagnosis not present

## 2016-06-16 DIAGNOSIS — G2581 Restless legs syndrome: Secondary | ICD-10-CM | POA: Diagnosis not present

## 2016-06-16 DIAGNOSIS — I10 Essential (primary) hypertension: Secondary | ICD-10-CM | POA: Diagnosis not present

## 2016-06-16 DIAGNOSIS — M1990 Unspecified osteoarthritis, unspecified site: Secondary | ICD-10-CM | POA: Diagnosis not present

## 2016-06-17 DIAGNOSIS — E119 Type 2 diabetes mellitus without complications: Secondary | ICD-10-CM | POA: Diagnosis not present

## 2016-06-17 DIAGNOSIS — G2581 Restless legs syndrome: Secondary | ICD-10-CM | POA: Diagnosis not present

## 2016-06-17 DIAGNOSIS — I10 Essential (primary) hypertension: Secondary | ICD-10-CM | POA: Diagnosis not present

## 2016-06-17 DIAGNOSIS — M1990 Unspecified osteoarthritis, unspecified site: Secondary | ICD-10-CM | POA: Diagnosis not present

## 2016-06-17 DIAGNOSIS — Z4800 Encounter for change or removal of nonsurgical wound dressing: Secondary | ICD-10-CM | POA: Diagnosis not present

## 2016-06-17 DIAGNOSIS — S81001D Unspecified open wound, right knee, subsequent encounter: Secondary | ICD-10-CM | POA: Diagnosis not present

## 2016-06-21 DIAGNOSIS — I10 Essential (primary) hypertension: Secondary | ICD-10-CM | POA: Diagnosis not present

## 2016-06-21 DIAGNOSIS — E119 Type 2 diabetes mellitus without complications: Secondary | ICD-10-CM | POA: Diagnosis not present

## 2016-06-21 DIAGNOSIS — G2581 Restless legs syndrome: Secondary | ICD-10-CM | POA: Diagnosis not present

## 2016-06-21 DIAGNOSIS — S81001D Unspecified open wound, right knee, subsequent encounter: Secondary | ICD-10-CM | POA: Diagnosis not present

## 2016-06-21 DIAGNOSIS — Z4800 Encounter for change or removal of nonsurgical wound dressing: Secondary | ICD-10-CM | POA: Diagnosis not present

## 2016-06-21 DIAGNOSIS — M1990 Unspecified osteoarthritis, unspecified site: Secondary | ICD-10-CM | POA: Diagnosis not present

## 2016-06-23 DIAGNOSIS — G2581 Restless legs syndrome: Secondary | ICD-10-CM | POA: Diagnosis not present

## 2016-06-23 DIAGNOSIS — I10 Essential (primary) hypertension: Secondary | ICD-10-CM | POA: Diagnosis not present

## 2016-06-23 DIAGNOSIS — Z4800 Encounter for change or removal of nonsurgical wound dressing: Secondary | ICD-10-CM | POA: Diagnosis not present

## 2016-06-23 DIAGNOSIS — E119 Type 2 diabetes mellitus without complications: Secondary | ICD-10-CM | POA: Diagnosis not present

## 2016-06-23 DIAGNOSIS — M1990 Unspecified osteoarthritis, unspecified site: Secondary | ICD-10-CM | POA: Diagnosis not present

## 2016-06-23 DIAGNOSIS — S81001D Unspecified open wound, right knee, subsequent encounter: Secondary | ICD-10-CM | POA: Diagnosis not present

## 2016-06-24 DIAGNOSIS — S81001D Unspecified open wound, right knee, subsequent encounter: Secondary | ICD-10-CM | POA: Diagnosis not present

## 2016-06-28 DIAGNOSIS — Z4800 Encounter for change or removal of nonsurgical wound dressing: Secondary | ICD-10-CM | POA: Diagnosis not present

## 2016-06-28 DIAGNOSIS — S81001D Unspecified open wound, right knee, subsequent encounter: Secondary | ICD-10-CM | POA: Diagnosis not present

## 2016-06-28 DIAGNOSIS — M1990 Unspecified osteoarthritis, unspecified site: Secondary | ICD-10-CM | POA: Diagnosis not present

## 2016-06-28 DIAGNOSIS — E119 Type 2 diabetes mellitus without complications: Secondary | ICD-10-CM | POA: Diagnosis not present

## 2016-06-28 DIAGNOSIS — G2581 Restless legs syndrome: Secondary | ICD-10-CM | POA: Diagnosis not present

## 2016-06-28 DIAGNOSIS — I10 Essential (primary) hypertension: Secondary | ICD-10-CM | POA: Diagnosis not present

## 2016-06-30 DIAGNOSIS — S81001D Unspecified open wound, right knee, subsequent encounter: Secondary | ICD-10-CM | POA: Diagnosis not present

## 2016-06-30 DIAGNOSIS — E119 Type 2 diabetes mellitus without complications: Secondary | ICD-10-CM | POA: Diagnosis not present

## 2016-06-30 DIAGNOSIS — M1990 Unspecified osteoarthritis, unspecified site: Secondary | ICD-10-CM | POA: Diagnosis not present

## 2016-06-30 DIAGNOSIS — Z4800 Encounter for change or removal of nonsurgical wound dressing: Secondary | ICD-10-CM | POA: Diagnosis not present

## 2016-06-30 DIAGNOSIS — G2581 Restless legs syndrome: Secondary | ICD-10-CM | POA: Diagnosis not present

## 2016-06-30 DIAGNOSIS — I10 Essential (primary) hypertension: Secondary | ICD-10-CM | POA: Diagnosis not present

## 2016-07-01 DIAGNOSIS — S81001D Unspecified open wound, right knee, subsequent encounter: Secondary | ICD-10-CM | POA: Diagnosis not present

## 2016-07-05 DIAGNOSIS — M1990 Unspecified osteoarthritis, unspecified site: Secondary | ICD-10-CM | POA: Diagnosis not present

## 2016-07-05 DIAGNOSIS — Z4800 Encounter for change or removal of nonsurgical wound dressing: Secondary | ICD-10-CM | POA: Diagnosis not present

## 2016-07-05 DIAGNOSIS — S81001D Unspecified open wound, right knee, subsequent encounter: Secondary | ICD-10-CM | POA: Diagnosis not present

## 2016-07-05 DIAGNOSIS — G2581 Restless legs syndrome: Secondary | ICD-10-CM | POA: Diagnosis not present

## 2016-07-05 DIAGNOSIS — E119 Type 2 diabetes mellitus without complications: Secondary | ICD-10-CM | POA: Diagnosis not present

## 2016-07-05 DIAGNOSIS — I10 Essential (primary) hypertension: Secondary | ICD-10-CM | POA: Diagnosis not present

## 2016-07-07 DIAGNOSIS — M1990 Unspecified osteoarthritis, unspecified site: Secondary | ICD-10-CM | POA: Diagnosis not present

## 2016-07-07 DIAGNOSIS — E119 Type 2 diabetes mellitus without complications: Secondary | ICD-10-CM | POA: Diagnosis not present

## 2016-07-07 DIAGNOSIS — Z4800 Encounter for change or removal of nonsurgical wound dressing: Secondary | ICD-10-CM | POA: Diagnosis not present

## 2016-07-07 DIAGNOSIS — G2581 Restless legs syndrome: Secondary | ICD-10-CM | POA: Diagnosis not present

## 2016-07-07 DIAGNOSIS — S81001D Unspecified open wound, right knee, subsequent encounter: Secondary | ICD-10-CM | POA: Diagnosis not present

## 2016-07-07 DIAGNOSIS — I10 Essential (primary) hypertension: Secondary | ICD-10-CM | POA: Diagnosis not present

## 2016-07-12 DIAGNOSIS — Z4800 Encounter for change or removal of nonsurgical wound dressing: Secondary | ICD-10-CM | POA: Diagnosis not present

## 2016-07-12 DIAGNOSIS — E119 Type 2 diabetes mellitus without complications: Secondary | ICD-10-CM | POA: Diagnosis not present

## 2016-07-12 DIAGNOSIS — I10 Essential (primary) hypertension: Secondary | ICD-10-CM | POA: Diagnosis not present

## 2016-07-12 DIAGNOSIS — G2581 Restless legs syndrome: Secondary | ICD-10-CM | POA: Diagnosis not present

## 2016-07-12 DIAGNOSIS — S81001D Unspecified open wound, right knee, subsequent encounter: Secondary | ICD-10-CM | POA: Diagnosis not present

## 2016-07-12 DIAGNOSIS — M1990 Unspecified osteoarthritis, unspecified site: Secondary | ICD-10-CM | POA: Diagnosis not present

## 2016-07-14 DIAGNOSIS — E119 Type 2 diabetes mellitus without complications: Secondary | ICD-10-CM | POA: Diagnosis not present

## 2016-07-14 DIAGNOSIS — S81001D Unspecified open wound, right knee, subsequent encounter: Secondary | ICD-10-CM | POA: Diagnosis not present

## 2016-07-14 DIAGNOSIS — M1990 Unspecified osteoarthritis, unspecified site: Secondary | ICD-10-CM | POA: Diagnosis not present

## 2016-07-14 DIAGNOSIS — Z4800 Encounter for change or removal of nonsurgical wound dressing: Secondary | ICD-10-CM | POA: Diagnosis not present

## 2016-07-14 DIAGNOSIS — I10 Essential (primary) hypertension: Secondary | ICD-10-CM | POA: Diagnosis not present

## 2016-07-14 DIAGNOSIS — G2581 Restless legs syndrome: Secondary | ICD-10-CM | POA: Diagnosis not present

## 2016-07-15 DIAGNOSIS — S81001D Unspecified open wound, right knee, subsequent encounter: Secondary | ICD-10-CM | POA: Diagnosis not present

## 2016-07-27 DIAGNOSIS — E119 Type 2 diabetes mellitus without complications: Secondary | ICD-10-CM | POA: Diagnosis not present

## 2016-07-27 DIAGNOSIS — I10 Essential (primary) hypertension: Secondary | ICD-10-CM | POA: Diagnosis not present

## 2016-08-01 DIAGNOSIS — J029 Acute pharyngitis, unspecified: Secondary | ICD-10-CM | POA: Diagnosis not present

## 2016-08-01 DIAGNOSIS — J069 Acute upper respiratory infection, unspecified: Secondary | ICD-10-CM | POA: Diagnosis not present

## 2016-08-01 DIAGNOSIS — R0981 Nasal congestion: Secondary | ICD-10-CM | POA: Diagnosis not present

## 2016-08-01 DIAGNOSIS — R05 Cough: Secondary | ICD-10-CM | POA: Diagnosis not present

## 2016-08-10 DIAGNOSIS — Z299 Encounter for prophylactic measures, unspecified: Secondary | ICD-10-CM | POA: Diagnosis not present

## 2016-08-10 DIAGNOSIS — Z79899 Other long term (current) drug therapy: Secondary | ICD-10-CM | POA: Diagnosis not present

## 2016-08-10 DIAGNOSIS — Z6827 Body mass index (BMI) 27.0-27.9, adult: Secondary | ICD-10-CM | POA: Diagnosis not present

## 2016-08-10 DIAGNOSIS — Z1389 Encounter for screening for other disorder: Secondary | ICD-10-CM | POA: Diagnosis not present

## 2016-08-10 DIAGNOSIS — I10 Essential (primary) hypertension: Secondary | ICD-10-CM | POA: Diagnosis not present

## 2016-08-10 DIAGNOSIS — E1142 Type 2 diabetes mellitus with diabetic polyneuropathy: Secondary | ICD-10-CM | POA: Diagnosis not present

## 2016-08-10 DIAGNOSIS — Z0000 Encounter for general adult medical examination without abnormal findings: Secondary | ICD-10-CM | POA: Diagnosis not present

## 2016-08-10 DIAGNOSIS — Z7189 Other specified counseling: Secondary | ICD-10-CM | POA: Diagnosis not present

## 2016-08-10 DIAGNOSIS — Z1211 Encounter for screening for malignant neoplasm of colon: Secondary | ICD-10-CM | POA: Diagnosis not present

## 2016-08-10 DIAGNOSIS — R5383 Other fatigue: Secondary | ICD-10-CM | POA: Diagnosis not present

## 2016-08-10 DIAGNOSIS — M545 Low back pain: Secondary | ICD-10-CM | POA: Diagnosis not present

## 2016-08-10 DIAGNOSIS — Z1231 Encounter for screening mammogram for malignant neoplasm of breast: Secondary | ICD-10-CM | POA: Diagnosis not present

## 2016-08-16 DIAGNOSIS — E1140 Type 2 diabetes mellitus with diabetic neuropathy, unspecified: Secondary | ICD-10-CM | POA: Diagnosis not present

## 2016-08-16 DIAGNOSIS — E1151 Type 2 diabetes mellitus with diabetic peripheral angiopathy without gangrene: Secondary | ICD-10-CM | POA: Diagnosis not present

## 2016-08-17 DIAGNOSIS — S81001D Unspecified open wound, right knee, subsequent encounter: Secondary | ICD-10-CM | POA: Diagnosis not present

## 2016-09-30 DIAGNOSIS — H25811 Combined forms of age-related cataract, right eye: Secondary | ICD-10-CM | POA: Diagnosis not present

## 2016-09-30 DIAGNOSIS — H40013 Open angle with borderline findings, low risk, bilateral: Secondary | ICD-10-CM | POA: Diagnosis not present

## 2016-09-30 DIAGNOSIS — H25812 Combined forms of age-related cataract, left eye: Secondary | ICD-10-CM | POA: Diagnosis not present

## 2016-09-30 DIAGNOSIS — H401433 Capsular glaucoma with pseudoexfoliation of lens, bilateral, severe stage: Secondary | ICD-10-CM | POA: Diagnosis not present

## 2016-09-30 DIAGNOSIS — E119 Type 2 diabetes mellitus without complications: Secondary | ICD-10-CM | POA: Diagnosis not present

## 2016-09-30 DIAGNOSIS — H2511 Age-related nuclear cataract, right eye: Secondary | ICD-10-CM | POA: Diagnosis not present

## 2016-10-25 DIAGNOSIS — E1140 Type 2 diabetes mellitus with diabetic neuropathy, unspecified: Secondary | ICD-10-CM | POA: Diagnosis not present

## 2016-10-25 DIAGNOSIS — E1151 Type 2 diabetes mellitus with diabetic peripheral angiopathy without gangrene: Secondary | ICD-10-CM | POA: Diagnosis not present

## 2016-10-31 DIAGNOSIS — I10 Essential (primary) hypertension: Secondary | ICD-10-CM | POA: Diagnosis not present

## 2016-10-31 DIAGNOSIS — E119 Type 2 diabetes mellitus without complications: Secondary | ICD-10-CM | POA: Diagnosis not present

## 2016-11-02 DIAGNOSIS — H25811 Combined forms of age-related cataract, right eye: Secondary | ICD-10-CM | POA: Diagnosis not present

## 2016-11-02 DIAGNOSIS — H2511 Age-related nuclear cataract, right eye: Secondary | ICD-10-CM | POA: Diagnosis not present

## 2016-11-18 DIAGNOSIS — H2512 Age-related nuclear cataract, left eye: Secondary | ICD-10-CM | POA: Diagnosis not present

## 2016-11-23 DIAGNOSIS — H2512 Age-related nuclear cataract, left eye: Secondary | ICD-10-CM | POA: Diagnosis not present

## 2016-11-23 DIAGNOSIS — H25812 Combined forms of age-related cataract, left eye: Secondary | ICD-10-CM | POA: Diagnosis not present

## 2016-12-08 DIAGNOSIS — E663 Overweight: Secondary | ICD-10-CM | POA: Diagnosis not present

## 2016-12-08 DIAGNOSIS — I10 Essential (primary) hypertension: Secondary | ICD-10-CM | POA: Diagnosis not present

## 2016-12-08 DIAGNOSIS — Z789 Other specified health status: Secondary | ICD-10-CM | POA: Diagnosis not present

## 2016-12-08 DIAGNOSIS — E119 Type 2 diabetes mellitus without complications: Secondary | ICD-10-CM | POA: Diagnosis not present

## 2016-12-08 DIAGNOSIS — Z299 Encounter for prophylactic measures, unspecified: Secondary | ICD-10-CM | POA: Diagnosis not present

## 2016-12-08 DIAGNOSIS — E1142 Type 2 diabetes mellitus with diabetic polyneuropathy: Secondary | ICD-10-CM | POA: Diagnosis not present

## 2017-01-17 DIAGNOSIS — E1140 Type 2 diabetes mellitus with diabetic neuropathy, unspecified: Secondary | ICD-10-CM | POA: Diagnosis not present

## 2017-01-17 DIAGNOSIS — E1151 Type 2 diabetes mellitus with diabetic peripheral angiopathy without gangrene: Secondary | ICD-10-CM | POA: Diagnosis not present

## 2017-01-25 DIAGNOSIS — E119 Type 2 diabetes mellitus without complications: Secondary | ICD-10-CM | POA: Diagnosis not present

## 2017-01-25 DIAGNOSIS — I10 Essential (primary) hypertension: Secondary | ICD-10-CM | POA: Diagnosis not present

## 2017-02-01 DIAGNOSIS — E1142 Type 2 diabetes mellitus with diabetic polyneuropathy: Secondary | ICD-10-CM | POA: Diagnosis not present

## 2017-02-01 DIAGNOSIS — E1165 Type 2 diabetes mellitus with hyperglycemia: Secondary | ICD-10-CM | POA: Diagnosis not present

## 2017-02-01 DIAGNOSIS — Z6828 Body mass index (BMI) 28.0-28.9, adult: Secondary | ICD-10-CM | POA: Diagnosis not present

## 2017-02-01 DIAGNOSIS — Z713 Dietary counseling and surveillance: Secondary | ICD-10-CM | POA: Diagnosis not present

## 2017-02-01 DIAGNOSIS — I10 Essential (primary) hypertension: Secondary | ICD-10-CM | POA: Diagnosis not present

## 2017-02-01 DIAGNOSIS — Z299 Encounter for prophylactic measures, unspecified: Secondary | ICD-10-CM | POA: Diagnosis not present

## 2017-02-01 DIAGNOSIS — J309 Allergic rhinitis, unspecified: Secondary | ICD-10-CM | POA: Diagnosis not present

## 2017-02-01 DIAGNOSIS — I8390 Asymptomatic varicose veins of unspecified lower extremity: Secondary | ICD-10-CM | POA: Diagnosis not present

## 2017-02-01 DIAGNOSIS — M170 Bilateral primary osteoarthritis of knee: Secondary | ICD-10-CM | POA: Diagnosis not present

## 2017-02-10 DIAGNOSIS — H401432 Capsular glaucoma with pseudoexfoliation of lens, bilateral, moderate stage: Secondary | ICD-10-CM | POA: Diagnosis not present

## 2017-03-16 DIAGNOSIS — M170 Bilateral primary osteoarthritis of knee: Secondary | ICD-10-CM | POA: Diagnosis not present

## 2017-03-16 DIAGNOSIS — I10 Essential (primary) hypertension: Secondary | ICD-10-CM | POA: Diagnosis not present

## 2017-03-16 DIAGNOSIS — E8940 Asymptomatic postprocedural ovarian failure: Secondary | ICD-10-CM | POA: Diagnosis not present

## 2017-03-16 DIAGNOSIS — Z299 Encounter for prophylactic measures, unspecified: Secondary | ICD-10-CM | POA: Diagnosis not present

## 2017-03-16 DIAGNOSIS — E1165 Type 2 diabetes mellitus with hyperglycemia: Secondary | ICD-10-CM | POA: Diagnosis not present

## 2017-03-16 DIAGNOSIS — M159 Polyosteoarthritis, unspecified: Secondary | ICD-10-CM | POA: Diagnosis not present

## 2017-03-27 DIAGNOSIS — Z961 Presence of intraocular lens: Secondary | ICD-10-CM | POA: Diagnosis not present

## 2017-03-27 DIAGNOSIS — H401432 Capsular glaucoma with pseudoexfoliation of lens, bilateral, moderate stage: Secondary | ICD-10-CM | POA: Diagnosis not present

## 2017-04-18 DIAGNOSIS — E1140 Type 2 diabetes mellitus with diabetic neuropathy, unspecified: Secondary | ICD-10-CM | POA: Diagnosis not present

## 2017-04-18 DIAGNOSIS — E1151 Type 2 diabetes mellitus with diabetic peripheral angiopathy without gangrene: Secondary | ICD-10-CM | POA: Diagnosis not present

## 2017-05-03 DIAGNOSIS — I10 Essential (primary) hypertension: Secondary | ICD-10-CM | POA: Diagnosis not present

## 2017-05-03 DIAGNOSIS — E119 Type 2 diabetes mellitus without complications: Secondary | ICD-10-CM | POA: Diagnosis not present

## 2017-05-09 DIAGNOSIS — Z96651 Presence of right artificial knee joint: Secondary | ICD-10-CM | POA: Diagnosis not present

## 2017-05-09 DIAGNOSIS — M25552 Pain in left hip: Secondary | ICD-10-CM | POA: Diagnosis not present

## 2017-05-17 DIAGNOSIS — R2689 Other abnormalities of gait and mobility: Secondary | ICD-10-CM | POA: Diagnosis not present

## 2017-05-17 DIAGNOSIS — M169 Osteoarthritis of hip, unspecified: Secondary | ICD-10-CM | POA: Diagnosis not present

## 2017-05-17 DIAGNOSIS — R531 Weakness: Secondary | ICD-10-CM | POA: Diagnosis not present

## 2017-05-17 DIAGNOSIS — M545 Low back pain: Secondary | ICD-10-CM | POA: Diagnosis not present

## 2017-05-19 DIAGNOSIS — Z299 Encounter for prophylactic measures, unspecified: Secondary | ICD-10-CM | POA: Diagnosis not present

## 2017-05-19 DIAGNOSIS — Z6829 Body mass index (BMI) 29.0-29.9, adult: Secondary | ICD-10-CM | POA: Diagnosis not present

## 2017-05-19 DIAGNOSIS — Z713 Dietary counseling and surveillance: Secondary | ICD-10-CM | POA: Diagnosis not present

## 2017-05-19 DIAGNOSIS — I8393 Asymptomatic varicose veins of bilateral lower extremities: Secondary | ICD-10-CM | POA: Diagnosis not present

## 2017-05-19 DIAGNOSIS — E1165 Type 2 diabetes mellitus with hyperglycemia: Secondary | ICD-10-CM | POA: Diagnosis not present

## 2017-05-23 DIAGNOSIS — M545 Low back pain: Secondary | ICD-10-CM | POA: Diagnosis not present

## 2017-05-23 DIAGNOSIS — R2689 Other abnormalities of gait and mobility: Secondary | ICD-10-CM | POA: Diagnosis not present

## 2017-05-23 DIAGNOSIS — R531 Weakness: Secondary | ICD-10-CM | POA: Diagnosis not present

## 2017-05-23 DIAGNOSIS — M169 Osteoarthritis of hip, unspecified: Secondary | ICD-10-CM | POA: Diagnosis not present

## 2017-05-24 DIAGNOSIS — R2689 Other abnormalities of gait and mobility: Secondary | ICD-10-CM | POA: Diagnosis not present

## 2017-05-24 DIAGNOSIS — M545 Low back pain: Secondary | ICD-10-CM | POA: Diagnosis not present

## 2017-05-24 DIAGNOSIS — M169 Osteoarthritis of hip, unspecified: Secondary | ICD-10-CM | POA: Diagnosis not present

## 2017-05-24 DIAGNOSIS — R531 Weakness: Secondary | ICD-10-CM | POA: Diagnosis not present

## 2017-05-30 DIAGNOSIS — M545 Low back pain: Secondary | ICD-10-CM | POA: Diagnosis not present

## 2017-05-30 DIAGNOSIS — R2689 Other abnormalities of gait and mobility: Secondary | ICD-10-CM | POA: Diagnosis not present

## 2017-05-30 DIAGNOSIS — M169 Osteoarthritis of hip, unspecified: Secondary | ICD-10-CM | POA: Diagnosis not present

## 2017-05-30 DIAGNOSIS — R531 Weakness: Secondary | ICD-10-CM | POA: Diagnosis not present

## 2017-05-31 DIAGNOSIS — R2689 Other abnormalities of gait and mobility: Secondary | ICD-10-CM | POA: Diagnosis not present

## 2017-05-31 DIAGNOSIS — R531 Weakness: Secondary | ICD-10-CM | POA: Diagnosis not present

## 2017-05-31 DIAGNOSIS — M169 Osteoarthritis of hip, unspecified: Secondary | ICD-10-CM | POA: Diagnosis not present

## 2017-05-31 DIAGNOSIS — M545 Low back pain: Secondary | ICD-10-CM | POA: Diagnosis not present

## 2017-06-06 DIAGNOSIS — E8940 Asymptomatic postprocedural ovarian failure: Secondary | ICD-10-CM | POA: Diagnosis not present

## 2017-06-07 DIAGNOSIS — R2689 Other abnormalities of gait and mobility: Secondary | ICD-10-CM | POA: Diagnosis not present

## 2017-06-07 DIAGNOSIS — R531 Weakness: Secondary | ICD-10-CM | POA: Diagnosis not present

## 2017-06-07 DIAGNOSIS — M169 Osteoarthritis of hip, unspecified: Secondary | ICD-10-CM | POA: Diagnosis not present

## 2017-06-07 DIAGNOSIS — M545 Low back pain: Secondary | ICD-10-CM | POA: Diagnosis not present

## 2017-06-09 DIAGNOSIS — M545 Low back pain: Secondary | ICD-10-CM | POA: Diagnosis not present

## 2017-06-09 DIAGNOSIS — R531 Weakness: Secondary | ICD-10-CM | POA: Diagnosis not present

## 2017-06-09 DIAGNOSIS — M169 Osteoarthritis of hip, unspecified: Secondary | ICD-10-CM | POA: Diagnosis not present

## 2017-06-09 DIAGNOSIS — R2689 Other abnormalities of gait and mobility: Secondary | ICD-10-CM | POA: Diagnosis not present

## 2017-06-12 DIAGNOSIS — R2689 Other abnormalities of gait and mobility: Secondary | ICD-10-CM | POA: Diagnosis not present

## 2017-06-12 DIAGNOSIS — M169 Osteoarthritis of hip, unspecified: Secondary | ICD-10-CM | POA: Diagnosis not present

## 2017-06-12 DIAGNOSIS — M545 Low back pain: Secondary | ICD-10-CM | POA: Diagnosis not present

## 2017-06-12 DIAGNOSIS — R531 Weakness: Secondary | ICD-10-CM | POA: Diagnosis not present

## 2017-06-14 DIAGNOSIS — R531 Weakness: Secondary | ICD-10-CM | POA: Diagnosis not present

## 2017-06-14 DIAGNOSIS — M545 Low back pain: Secondary | ICD-10-CM | POA: Diagnosis not present

## 2017-06-14 DIAGNOSIS — M169 Osteoarthritis of hip, unspecified: Secondary | ICD-10-CM | POA: Diagnosis not present

## 2017-06-14 DIAGNOSIS — R2689 Other abnormalities of gait and mobility: Secondary | ICD-10-CM | POA: Diagnosis not present

## 2017-06-20 DIAGNOSIS — E119 Type 2 diabetes mellitus without complications: Secondary | ICD-10-CM | POA: Diagnosis not present

## 2017-06-20 DIAGNOSIS — I10 Essential (primary) hypertension: Secondary | ICD-10-CM | POA: Diagnosis not present

## 2017-06-26 DIAGNOSIS — Z1231 Encounter for screening mammogram for malignant neoplasm of breast: Secondary | ICD-10-CM | POA: Diagnosis not present

## 2017-06-27 DIAGNOSIS — E1165 Type 2 diabetes mellitus with hyperglycemia: Secondary | ICD-10-CM | POA: Diagnosis not present

## 2017-06-27 DIAGNOSIS — Z6828 Body mass index (BMI) 28.0-28.9, adult: Secondary | ICD-10-CM | POA: Diagnosis not present

## 2017-06-27 DIAGNOSIS — Z299 Encounter for prophylactic measures, unspecified: Secondary | ICD-10-CM | POA: Diagnosis not present

## 2017-06-27 DIAGNOSIS — I10 Essential (primary) hypertension: Secondary | ICD-10-CM | POA: Diagnosis not present

## 2017-06-27 DIAGNOSIS — Z713 Dietary counseling and surveillance: Secondary | ICD-10-CM | POA: Diagnosis not present

## 2017-08-07 DIAGNOSIS — E1151 Type 2 diabetes mellitus with diabetic peripheral angiopathy without gangrene: Secondary | ICD-10-CM | POA: Diagnosis not present

## 2017-08-07 DIAGNOSIS — E1140 Type 2 diabetes mellitus with diabetic neuropathy, unspecified: Secondary | ICD-10-CM | POA: Diagnosis not present

## 2017-08-16 DIAGNOSIS — E1142 Type 2 diabetes mellitus with diabetic polyneuropathy: Secondary | ICD-10-CM | POA: Diagnosis not present

## 2017-08-16 DIAGNOSIS — I10 Essential (primary) hypertension: Secondary | ICD-10-CM | POA: Diagnosis not present

## 2017-08-16 DIAGNOSIS — Z1331 Encounter for screening for depression: Secondary | ICD-10-CM | POA: Diagnosis not present

## 2017-08-16 DIAGNOSIS — R5383 Other fatigue: Secondary | ICD-10-CM | POA: Diagnosis not present

## 2017-08-16 DIAGNOSIS — Z0000 Encounter for general adult medical examination without abnormal findings: Secondary | ICD-10-CM | POA: Diagnosis not present

## 2017-08-16 DIAGNOSIS — E1165 Type 2 diabetes mellitus with hyperglycemia: Secondary | ICD-10-CM | POA: Diagnosis not present

## 2017-08-16 DIAGNOSIS — Z1211 Encounter for screening for malignant neoplasm of colon: Secondary | ICD-10-CM | POA: Diagnosis not present

## 2017-08-16 DIAGNOSIS — Z79899 Other long term (current) drug therapy: Secondary | ICD-10-CM | POA: Diagnosis not present

## 2017-08-16 DIAGNOSIS — Z6825 Body mass index (BMI) 25.0-25.9, adult: Secondary | ICD-10-CM | POA: Diagnosis not present

## 2017-08-16 DIAGNOSIS — Z7189 Other specified counseling: Secondary | ICD-10-CM | POA: Diagnosis not present

## 2017-08-16 DIAGNOSIS — Z1339 Encounter for screening examination for other mental health and behavioral disorders: Secondary | ICD-10-CM | POA: Diagnosis not present

## 2017-08-16 DIAGNOSIS — Z299 Encounter for prophylactic measures, unspecified: Secondary | ICD-10-CM | POA: Diagnosis not present

## 2017-10-10 DIAGNOSIS — R0600 Dyspnea, unspecified: Secondary | ICD-10-CM | POA: Diagnosis not present

## 2017-10-10 DIAGNOSIS — R0602 Shortness of breath: Secondary | ICD-10-CM | POA: Diagnosis not present

## 2017-10-10 DIAGNOSIS — E1165 Type 2 diabetes mellitus with hyperglycemia: Secondary | ICD-10-CM | POA: Diagnosis not present

## 2017-10-10 DIAGNOSIS — E1142 Type 2 diabetes mellitus with diabetic polyneuropathy: Secondary | ICD-10-CM | POA: Diagnosis not present

## 2017-10-10 DIAGNOSIS — Z6828 Body mass index (BMI) 28.0-28.9, adult: Secondary | ICD-10-CM | POA: Diagnosis not present

## 2017-10-10 DIAGNOSIS — Z299 Encounter for prophylactic measures, unspecified: Secondary | ICD-10-CM | POA: Diagnosis not present

## 2017-10-19 DIAGNOSIS — I10 Essential (primary) hypertension: Secondary | ICD-10-CM | POA: Diagnosis not present

## 2017-10-19 DIAGNOSIS — E119 Type 2 diabetes mellitus without complications: Secondary | ICD-10-CM | POA: Diagnosis not present

## 2017-10-23 DIAGNOSIS — R0602 Shortness of breath: Secondary | ICD-10-CM | POA: Diagnosis not present

## 2017-10-25 ENCOUNTER — Encounter: Payer: Self-pay | Admitting: Cardiovascular Disease

## 2017-10-25 ENCOUNTER — Ambulatory Visit (INDEPENDENT_AMBULATORY_CARE_PROVIDER_SITE_OTHER): Payer: Medicare Other | Admitting: Cardiovascular Disease

## 2017-10-25 VITALS — BP 156/80 | HR 64 | Ht 67.0 in | Wt 186.0 lb

## 2017-10-25 DIAGNOSIS — I10 Essential (primary) hypertension: Secondary | ICD-10-CM | POA: Diagnosis not present

## 2017-10-25 DIAGNOSIS — E119 Type 2 diabetes mellitus without complications: Secondary | ICD-10-CM | POA: Diagnosis not present

## 2017-10-25 DIAGNOSIS — R0609 Other forms of dyspnea: Secondary | ICD-10-CM

## 2017-10-25 NOTE — Patient Instructions (Addendum)
Your physician wants you to follow-up in:6 weeks  with [facility]     Your physician has requested that you have a lexiscan myoview. For further information please visit [email or web address]. Please follow instruction sheet, as given.  Your physician recommends that you continue on your current medications as directed. Please refer to the Current Medication list given to you today.   If you need a refill on your cardiac medications before your next appointment, please call your pharmacy.    No lab work ordered today.     Thank you for choosing [facility] !

## 2017-10-25 NOTE — Progress Notes (Addendum)
SUBJECTIVE: The patient is an 83 year old woman whom I last evaluated Echocardiogram on 10/29/14 also demonstrated normal left ventricular systolic function and regional wall motion, LVEF 55-60%, and grade 1 diastolic dysfunction. She underwent a normal nuclear stress test on 03/05/09.  She presents today for the evaluation of exertional dyspnea.  I personally reviewed an ECG performed by her PCP on 10/10/17 which demonstrated sinus rhythm with late R wave transition and nonspecific T wave abnormalities.  There was some baseline undulating artifact.  I reviewed additional documentation, labs, and studies from her PCP.  Past medical history includes type 2 diabetes mellitus and hypertension.  She has been experiencing exertional dyspnea for the past 6 months.  It can occur with simple activities such as walking in the grocery store or walking from her car to the grocery store or to the church.  This morning she walked across the street in front of our office because she was confused about the location of her office and her friend who is with her noticed that she quickly became short of breath.  She denies orthopnea and paroxysmal nocturnal dyspnea.  She has had some bilateral ankle swelling for the past 2 years.  She denies palpitations, lightheadedness, and syncope.  She does have balance issues when looking straight up or bending over and standing up.  She denies palpitations.  She also denies exertional chest pain and tightness.  She has difficulty sleeping and frequently wakes up in the middle of the night.  It appears an echocardiogram was performed by her PCP but I do not have these results.   Review of Systems: As per "subjective", otherwise negative.  Allergies  Allergen Reactions  . Codeine Nausea Only  . Milk-Related Compounds Diarrhea    Current Outpatient Medications  Medication Sig Dispense Refill  . diclofenac (VOLTAREN) 75 MG EC tablet Take 75 mg by mouth 2 (two) times  daily.    Marland Kitchen docusate sodium (COLACE) 100 MG capsule 1 tab 2 times a day while on narcotics.  STOOL SOFTENER 60 capsule 0  . glipiZIDE (GLUCOTROL XL) 5 MG 24 hr tablet Take 5 mg by mouth daily.    . metFORMIN (GLUCOPHAGE) 500 MG tablet Take 500 mg by mouth daily with breakfast.     . metoprolol (LOPRESSOR) 50 MG tablet Take 50 mg by mouth 2 (two) times daily.    . valsartan-hydrochlorothiazide (DIOVAN-HCT) 320-12.5 MG per tablet Take 1 tablet by mouth daily.      No current facility-administered medications for this visit.     Past Medical History:  Diagnosis Date  . Cramps, extremity    legs and hands  . Diabetes ([place])    type II  . Enterococcus UTI 02/03/2016  . H/O cardiovascular stress test 03/05/2009   normal, no ischemia  . H/O Doppler ultrasound    lower ext arterial doppler 04/13/09-no evidence of arterial insufficiency, normal values; distal aorta 2.4x2.7  . HTN (hypertension)   . Murmur, cardiac    echo 02/25/13- EF 60-65%, impaired relaxation-grade 1 diastolic dysfunction, mild concentric lvh, mild to moderate aortic regurgitation  . Pneumonia    May 2017  . Postoperative anemia due to acute blood loss 02/02/2016  . Primary localized osteoarthritis of right knee   . TIA (transient ischemic attack)    carotid doppler 05/08/11-normal patency    Past Surgical History:  Procedure Laterality Date  . ABDOMINAL HYSTERECTOMY    . BUNIONECTOMY Left   . COLONOSCOPY    .  TOTAL KNEE ARTHROPLASTY Right 02/01/2016   Procedure: TOTAL KNEE ARTHROPLASTY;  Surgeon: Elsie Saas, MD;  Location: [facility];  Service: Orthopedics;  Laterality: Right;    Social History   Socioeconomic History  . Marital status: Widowed    Spouse name: Not on file  . Number of children: 2  . Years of education: Not on file  . Highest education level: Not on file  Occupational History  . Not on file  Social Needs  . Financial resource strain: Not on file  . Food insecurity:    Worry: Not on file     Inability: Not on file  . Transportation needs:    Medical: Not on file    Non-medical: Not on file  Tobacco Use  . Smoking status: Never Smoker  . Smokeless tobacco: Never Used  Substance and Sexual Activity  . Alcohol use: No    Alcohol/week: 0.0 oz  . Drug use: No  . Sexual activity: Not on file  Lifestyle  . Physical activity:    Days per week: Not on file    Minutes per session: Not on file  . Stress: Not on file  Relationships  . Social connections:    Talks on phone: Not on file    Gets together: Not on file    Attends religious service: Not on file    Active member of club or organization: Not on file    Attends meetings of clubs or organizations: Not on file    Relationship status: Not on file  . Intimate partner violence:    Fear of current or ex partner: Not on file    Emotionally abused: Not on file    Physically abused: Not on file    Forced sexual activity: Not on file  Other Topics Concern  . Not on file  Social History Narrative   Step son lives with her.  She is a widow.     Vitals:   10/25/17 0812  BP: (!) 156/80  Pulse: 64  SpO2: 94%  Weight: 186 lb (84.4 kg)  Height: 5\' 7"  (1.702 m)    Wt Readings from Last 3 Encounters:  10/25/17 186 lb (84.4 kg)  02/01/16 181 lb 9.6 oz (82.4 kg)  01/22/16 181 lb 9.6 oz (82.4 kg)     PHYSICAL EXAM General: NAD HEENT: Normal. Neck: No JVD, no thyromegaly. Lungs: Clear to auscultation bilaterally with normal respiratory effort. CV: Regular rate and rhythm, normal S1/S2, no S3/S4, no murmur. No pretibial or periankle edema.  No carotid bruit.   Abdomen: Soft, nontender, no distention.  Neurologic: Alert and oriented.  Psych: Normal affect. Skin: Normal. Musculoskeletal: No gross deformities.    ECG: Most recent ECG reviewed.   Labs: Lab Results  Component Value Date/Time   K 4.0 02/03/2016 06:00 AM   BUN 35 (H) 02/03/2016 06:00 AM   CREATININE 1.19 (H) 02/03/2016 06:00 AM   CREATININE 1.25  (H) 03/01/2013 10:34 AM   ALT 18 01/22/2016 02:09 PM   TSH 1.161 03/01/2013 10:34 AM   HGB 10.2 (L) 02/03/2016 06:00 AM     Lipids: Lab Results  Component Value Date/Time   LDLCALC 79 03/01/2013 10:34 AM   CHOL 165 03/01/2013 10:34 AM   TRIG 214 (H) 03/01/2013 10:34 AM   HDL 43 03/01/2013 10:34 AM       ASSESSMENT AND PLAN: 1.  Exertional dyspnea: She has had progressive symptoms over the past 6 months and has no history of pulmonary disease.  She is a non-smoker.  Given her comorbidities such as type 2 diabetes mellitus and hypertension, ischemic heart disease will need to be ruled out. I will proceed with a nuclear myocardial perfusion imaging study to evaluate for ischemic heart disease (Lexiscan Myoview given her rapid exertional dyspnea and balance issues). I will request a copy of the echocardiogram report from her PCP.  2.  Hypertension: Blood pressure is elevated.  This will need further monitoring to determine if further antihypertensive titration is indicated.  3.  Type 2 diabetes mellitus: Currently being treated with glipizide and metformin.  I will obtain a copy of labs from her PCP.   Disposition: Follow up 6 weeks.  Time spent: 40 minutes, of which greater than 50% was spent reviewing symptoms, relevant blood tests and studies, and discussing management plan with the patient.    Kate Sable, M.D., F.A.C.C.  Addendum:  I reviewed labs and studies provided by her PCP.  Labs 08/17/2017: BUN 26, creatinine 1.49, sodium 143, potassium 4.7, TSH 1.4, total cholesterol 167, triglycerides 137, HDL 51, LDL 89.  Echocardiogram performed on 10/23/2017 demonstrated normal left ventricular systolic function, LVEF 50 to 72%, diastolic dysfunction, and mild left atrial enlargement.  There is mild tricuspid and aortic regurgitation.

## 2017-10-30 DIAGNOSIS — E1151 Type 2 diabetes mellitus with diabetic peripheral angiopathy without gangrene: Secondary | ICD-10-CM | POA: Diagnosis not present

## 2017-10-30 DIAGNOSIS — E1140 Type 2 diabetes mellitus with diabetic neuropathy, unspecified: Secondary | ICD-10-CM | POA: Diagnosis not present

## 2017-11-03 ENCOUNTER — Encounter (HOSPITAL_BASED_OUTPATIENT_CLINIC_OR_DEPARTMENT_OTHER)
Admission: RE | Admit: 2017-11-03 | Discharge: 2017-11-03 | Disposition: A | Discharge status: Home / Self Care | Payer: Medicare Other | Source: Ambulatory Visit | Attending: Cardiovascular Disease | Admitting: Cardiovascular Disease

## 2017-11-03 ENCOUNTER — Ambulatory Visit (HOSPITAL_COMMUNITY)
Admission: RE | Admit: 2017-11-03 | Discharge: 2017-11-03 | Disposition: A | Discharge status: Home / Self Care | Payer: Medicare Other | Source: Ambulatory Visit | Attending: Cardiovascular Disease | Admitting: Cardiovascular Disease

## 2017-11-03 ENCOUNTER — Encounter (HOSPITAL_COMMUNITY): Payer: Self-pay

## 2017-11-03 DIAGNOSIS — R0609 Other forms of dyspnea: Secondary | ICD-10-CM | POA: Diagnosis not present

## 2017-11-03 LAB — NM MYOCAR MULTI W/SPECT W/WALL MOTION / EF
CHL CUP NUCLEAR SDS: 1
CHL CUP NUCLEAR SRS: 2
CHL CUP RESTING HR STRESS: 63 {beats}/min
CSEPPHR: 80 {beats}/min
LHR: 0.41
LV dias vol: 101 mL (ref 46–106)
LV sys vol: 46 mL
NUC STRESS TID: 1.2
SSS: 3

## 2017-11-03 MED ORDER — SODIUM CHLORIDE 0.9% FLUSH
INTRAVENOUS | Status: AC
  Administered 2017-11-03: 10 mL via INTRAVENOUS
  Filled 2017-11-03: qty 10

## 2017-11-03 MED ORDER — TECHNETIUM TC 99M TETROFOSMIN IV KIT
30.0000 | PACK | Freq: Once | INTRAVENOUS | Status: AC | PRN
  Administered 2017-11-03: 33 via INTRAVENOUS

## 2017-11-03 MED ORDER — TECHNETIUM TC 99M TETROFOSMIN IV KIT
10.0000 | PACK | Freq: Once | INTRAVENOUS | Status: AC | PRN
  Administered 2017-11-03: 11 via INTRAVENOUS

## 2017-11-03 MED ORDER — REGADENOSON 0.4 MG/5ML IV SOLN
INTRAVENOUS | Status: AC
  Administered 2017-11-03: 0.4 mg via INTRAVENOUS
  Filled 2017-11-03: qty 5

## 2017-11-14 DIAGNOSIS — Z299 Encounter for prophylactic measures, unspecified: Secondary | ICD-10-CM | POA: Diagnosis not present

## 2017-11-14 DIAGNOSIS — E1142 Type 2 diabetes mellitus with diabetic polyneuropathy: Secondary | ICD-10-CM | POA: Diagnosis not present

## 2017-11-14 DIAGNOSIS — D1720 Benign lipomatous neoplasm of skin and subcutaneous tissue of unspecified limb: Secondary | ICD-10-CM | POA: Diagnosis not present

## 2017-11-14 DIAGNOSIS — I10 Essential (primary) hypertension: Secondary | ICD-10-CM | POA: Diagnosis not present

## 2017-11-14 DIAGNOSIS — Z6828 Body mass index (BMI) 28.0-28.9, adult: Secondary | ICD-10-CM | POA: Diagnosis not present

## 2017-11-14 DIAGNOSIS — E1165 Type 2 diabetes mellitus with hyperglycemia: Secondary | ICD-10-CM | POA: Diagnosis not present

## 2017-11-24 DIAGNOSIS — H26493 Other secondary cataract, bilateral: Secondary | ICD-10-CM | POA: Diagnosis not present

## 2017-11-24 DIAGNOSIS — H401432 Capsular glaucoma with pseudoexfoliation of lens, bilateral, moderate stage: Secondary | ICD-10-CM | POA: Diagnosis not present

## 2017-12-07 ENCOUNTER — Ambulatory Visit (INDEPENDENT_AMBULATORY_CARE_PROVIDER_SITE_OTHER): Payer: Medicare Other | Admitting: Cardiovascular Disease

## 2017-12-07 ENCOUNTER — Encounter: Payer: Self-pay | Admitting: Cardiovascular Disease

## 2017-12-07 VITALS — BP 118/76 | HR 75 | Ht 69.0 in | Wt 184.0 lb

## 2017-12-07 DIAGNOSIS — J302 Other seasonal allergic rhinitis: Secondary | ICD-10-CM | POA: Diagnosis not present

## 2017-12-07 DIAGNOSIS — R0609 Other forms of dyspnea: Secondary | ICD-10-CM

## 2017-12-07 DIAGNOSIS — I10 Essential (primary) hypertension: Secondary | ICD-10-CM | POA: Diagnosis not present

## 2017-12-07 DIAGNOSIS — E119 Type 2 diabetes mellitus without complications: Secondary | ICD-10-CM

## 2017-12-07 NOTE — Patient Instructions (Addendum)
Your physician wants you to follow-up in: as needed  with [facility]      Your physician recommends that you continue on your current medications as directed. Please refer to the Current Medication list given to you today.   No lab work or tests ordered today.      Thank you for choosing [facility] !

## 2017-12-07 NOTE — Progress Notes (Signed)
SUBJECTIVE: The patient returns for follow-up after undergoing cardiovascular testing performed for the evaluation of exertional dyspnea.  She underwent a low risk nuclear stress test on 11/03/2017.  There is a defect with mild reversibility and normal wall motion in the apical region.  It was suggestive of apical thinning versus mild apical infarct with mild peri-infarct ischemia.  Echocardiogram performed in April 2019 at an outside facility demonstrated normal left ventricular systolic function, LVEF 50 to 56%, diastolic dysfunction, mild LVH, and mild aortic and tricuspid regurgitation.  She denies chest pain.  She has shortness of breath and says she has difficulty taking a deep breath.  She denies audible wheezing.  She does describe postnasal drip and does have seasonal allergies.  She denies dysphagia for solids and liquids.  She denies symptoms of acid reflux disease.    Review of Systems: As per "subjective", otherwise negative.  Allergies  Allergen Reactions  . Codeine Nausea Only  . Milk-Related Compounds Diarrhea    Current Outpatient Medications  Medication Sig Dispense Refill  . diclofenac (VOLTAREN) 75 MG EC tablet Take 75 mg by mouth 2 (two) times daily.    Marland Kitchen glipiZIDE (GLUCOTROL XL) 5 MG 24 hr tablet Take 5 mg by mouth daily.    . metFORMIN (GLUCOPHAGE) 500 MG tablet Take 500 mg by mouth daily with breakfast.     . metoprolol (LOPRESSOR) 50 MG tablet Take 50 mg by mouth 2 (two) times daily.    . valsartan-hydrochlorothiazide (DIOVAN-HCT) 320-12.5 MG per tablet Take 1 tablet by mouth daily.      No current facility-administered medications for this visit.     Past Medical History:  Diagnosis Date  . Cramps, extremity    legs and hands  . Diabetes ([place])    type II  . Enterococcus UTI 02/03/2016  . H/O cardiovascular stress test 03/05/2009   normal, no ischemia  . H/O Doppler ultrasound    lower ext arterial doppler 04/13/09-no evidence of arterial  insufficiency, normal values; distal aorta 2.4x2.7  . HTN (hypertension)   . Murmur, cardiac    echo 02/25/13- EF 60-65%, impaired relaxation-grade 1 diastolic dysfunction, mild concentric lvh, mild to moderate aortic regurgitation  . Pneumonia    May 2017  . Postoperative anemia due to acute blood loss 02/02/2016  . Primary localized osteoarthritis of right knee   . TIA (transient ischemic attack)    carotid doppler 05/08/11-normal patency    Past Surgical History:  Procedure Laterality Date  . ABDOMINAL HYSTERECTOMY    . BUNIONECTOMY Left   . COLONOSCOPY    . TOTAL KNEE ARTHROPLASTY Right 02/01/2016   Procedure: TOTAL KNEE ARTHROPLASTY;  Surgeon: Elsie Saas, MD;  Location: [facility];  Service: Orthopedics;  Laterality: Right;    Social History   Socioeconomic History  . Marital status: Widowed    Spouse name: Not on file  . Number of children: 2  . Years of education: Not on file  . Highest education level: Not on file  Occupational History  . Not on file  Social Needs  . Financial resource strain: Not on file  . Food insecurity:    Worry: Not on file    Inability: Not on file  . Transportation needs:    Medical: Not on file    Non-medical: Not on file  Tobacco Use  . Smoking status: Never Smoker  . Smokeless tobacco: Never Used  Substance and Sexual Activity  . Alcohol use: No  Alcohol/week: 0.0 oz  . Drug use: No  . Sexual activity: Not on file  Lifestyle  . Physical activity:    Days per week: Not on file    Minutes per session: Not on file  . Stress: Not on file  Relationships  . Social connections:    Talks on phone: Not on file    Gets together: Not on file    Attends religious service: Not on file    Active member of club or organization: Not on file    Attends meetings of clubs or organizations: Not on file    Relationship status: Not on file  . Intimate partner violence:    Fear of current or ex partner: Not on file    Emotionally abused: Not on  file    Physically abused: Not on file    Forced sexual activity: Not on file  Other Topics Concern  . Not on file  Social History Narrative   Step son lives with her.  She is a widow.     Vitals:   12/07/17 1045  BP: 118/76  Pulse: 75  SpO2: 98%  Weight: 184 lb (83.5 kg)  Height: 5\' 9"  (1.753 m)    Wt Readings from Last 3 Encounters:  12/07/17 184 lb (83.5 kg)  10/25/17 186 lb (84.4 kg)  02/01/16 181 lb 9.6 oz (82.4 kg)     PHYSICAL EXAM General: NAD HEENT: Normal. Neck: No JVD, no thyromegaly. Lungs: Clear to auscultation bilaterally with normal respiratory effort. CV: Regular rate and rhythm, normal S1/S2, no S3/S4, no murmur. No pretibial or periankle edema.     Abdomen: Soft, nontender, no distention.  Neurologic: Alert and oriented.  Psych: Normal affect. Skin: Normal. Musculoskeletal: No gross deformities.    ECG: Most recent ECG reviewed.   Labs: Lab Results  Component Value Date/Time   K 4.0 02/03/2016 06:00 AM   BUN 35 (H) 02/03/2016 06:00 AM   CREATININE 1.19 (H) 02/03/2016 06:00 AM   CREATININE 1.25 (H) 03/01/2013 10:34 AM   ALT 18 01/22/2016 02:09 PM   TSH 1.161 03/01/2013 10:34 AM   HGB 10.2 (L) 02/03/2016 06:00 AM     Lipids: Lab Results  Component Value Date/Time   LDLCALC 79 03/01/2013 10:34 AM   CHOL 165 03/01/2013 10:34 AM   TRIG 214 (H) 03/01/2013 10:34 AM   HDL 43 03/01/2013 10:34 AM       ASSESSMENT AND PLAN:  1.  Exertional dyspnea: Low risk nuclear stress test as detailed above.  Echocardiogram demonstrated normal left ventricular systolic function as detailed above.  She also has some postnasal drip and seasonal allergies.  I do not appreciate any wheezing on exam.  I recommended she begin with an oral antihistamine daily for several weeks to see if this helps improve symptoms.  If she does develop wheezing, she may need an albuterol inhaler to be used as needed.  2.  Hypertension: Blood pressure is normal.  No changes to  therapy.  3.  Type 2 diabetes mellitus: Currently being treated with glipizide and metformin.  4.  Seasonal allergies: See discussion #1.   Disposition: Follow up as needed   Kate Sable, M.D., F.A.C.C.

## 2017-12-12 DIAGNOSIS — R21 Rash and other nonspecific skin eruption: Secondary | ICD-10-CM | POA: Diagnosis not present

## 2017-12-12 DIAGNOSIS — I10 Essential (primary) hypertension: Secondary | ICD-10-CM | POA: Diagnosis not present

## 2017-12-12 DIAGNOSIS — F329 Major depressive disorder, single episode, unspecified: Secondary | ICD-10-CM | POA: Diagnosis not present

## 2017-12-12 DIAGNOSIS — Z6828 Body mass index (BMI) 28.0-28.9, adult: Secondary | ICD-10-CM | POA: Diagnosis not present

## 2017-12-12 DIAGNOSIS — Z299 Encounter for prophylactic measures, unspecified: Secondary | ICD-10-CM | POA: Diagnosis not present

## 2017-12-12 DIAGNOSIS — E1142 Type 2 diabetes mellitus with diabetic polyneuropathy: Secondary | ICD-10-CM | POA: Diagnosis not present

## 2017-12-12 DIAGNOSIS — E1165 Type 2 diabetes mellitus with hyperglycemia: Secondary | ICD-10-CM | POA: Diagnosis not present

## 2018-01-07 DIAGNOSIS — S52531A Colles' fracture of right radius, initial encounter for closed fracture: Secondary | ICD-10-CM | POA: Diagnosis not present

## 2018-01-07 DIAGNOSIS — W1809XA Striking against other object with subsequent fall, initial encounter: Secondary | ICD-10-CM | POA: Diagnosis not present

## 2018-01-07 DIAGNOSIS — M1990 Unspecified osteoarthritis, unspecified site: Secondary | ICD-10-CM | POA: Diagnosis not present

## 2018-01-07 DIAGNOSIS — I10 Essential (primary) hypertension: Secondary | ICD-10-CM | POA: Diagnosis not present

## 2018-01-07 DIAGNOSIS — S52611A Displaced fracture of right ulna styloid process, initial encounter for closed fracture: Secondary | ICD-10-CM | POA: Diagnosis not present

## 2018-01-07 DIAGNOSIS — E119 Type 2 diabetes mellitus without complications: Secondary | ICD-10-CM | POA: Diagnosis not present

## 2018-01-16 DIAGNOSIS — S62101D Fracture of unspecified carpal bone, right wrist, subsequent encounter for fracture with routine healing: Secondary | ICD-10-CM | POA: Diagnosis not present

## 2018-01-16 DIAGNOSIS — E1165 Type 2 diabetes mellitus with hyperglycemia: Secondary | ICD-10-CM | POA: Diagnosis not present

## 2018-01-16 DIAGNOSIS — Z299 Encounter for prophylactic measures, unspecified: Secondary | ICD-10-CM | POA: Diagnosis not present

## 2018-01-16 DIAGNOSIS — E1142 Type 2 diabetes mellitus with diabetic polyneuropathy: Secondary | ICD-10-CM | POA: Diagnosis not present

## 2018-01-16 DIAGNOSIS — I10 Essential (primary) hypertension: Secondary | ICD-10-CM | POA: Diagnosis not present

## 2018-01-16 DIAGNOSIS — Z6828 Body mass index (BMI) 28.0-28.9, adult: Secondary | ICD-10-CM | POA: Diagnosis not present

## 2018-01-16 DIAGNOSIS — S62001A Unspecified fracture of navicular [scaphoid] bone of right wrist, initial encounter for closed fracture: Secondary | ICD-10-CM | POA: Diagnosis not present

## 2018-01-17 DIAGNOSIS — I10 Essential (primary) hypertension: Secondary | ICD-10-CM | POA: Diagnosis not present

## 2018-01-17 DIAGNOSIS — E119 Type 2 diabetes mellitus without complications: Secondary | ICD-10-CM | POA: Diagnosis not present

## 2018-01-18 DIAGNOSIS — S52501A Unspecified fracture of the lower end of right radius, initial encounter for closed fracture: Secondary | ICD-10-CM | POA: Diagnosis not present

## 2018-01-18 DIAGNOSIS — S52601A Unspecified fracture of lower end of right ulna, initial encounter for closed fracture: Secondary | ICD-10-CM | POA: Diagnosis not present

## 2018-01-19 DIAGNOSIS — Y999 Unspecified external cause status: Secondary | ICD-10-CM | POA: Diagnosis not present

## 2018-01-19 DIAGNOSIS — S52571A Other intraarticular fracture of lower end of right radius, initial encounter for closed fracture: Secondary | ICD-10-CM | POA: Diagnosis not present

## 2018-01-19 DIAGNOSIS — X58XXXA Exposure to other specified factors, initial encounter: Secondary | ICD-10-CM | POA: Diagnosis not present

## 2018-01-19 DIAGNOSIS — G8918 Other acute postprocedural pain: Secondary | ICD-10-CM | POA: Diagnosis not present

## 2018-01-23 DIAGNOSIS — S52571A Other intraarticular fracture of lower end of right radius, initial encounter for closed fracture: Secondary | ICD-10-CM | POA: Diagnosis not present

## 2018-01-23 DIAGNOSIS — M25531 Pain in right wrist: Secondary | ICD-10-CM | POA: Diagnosis not present

## 2018-01-23 DIAGNOSIS — M25631 Stiffness of right wrist, not elsewhere classified: Secondary | ICD-10-CM | POA: Diagnosis not present

## 2018-02-01 DIAGNOSIS — M25631 Stiffness of right wrist, not elsewhere classified: Secondary | ICD-10-CM | POA: Diagnosis not present

## 2018-02-16 DIAGNOSIS — E119 Type 2 diabetes mellitus without complications: Secondary | ICD-10-CM | POA: Diagnosis not present

## 2018-02-16 DIAGNOSIS — I10 Essential (primary) hypertension: Secondary | ICD-10-CM | POA: Diagnosis not present

## 2018-02-20 DIAGNOSIS — S52571A Other intraarticular fracture of lower end of right radius, initial encounter for closed fracture: Secondary | ICD-10-CM | POA: Diagnosis not present

## 2018-03-26 DIAGNOSIS — E1140 Type 2 diabetes mellitus with diabetic neuropathy, unspecified: Secondary | ICD-10-CM | POA: Diagnosis not present

## 2018-03-26 DIAGNOSIS — E1151 Type 2 diabetes mellitus with diabetic peripheral angiopathy without gangrene: Secondary | ICD-10-CM | POA: Diagnosis not present

## 2018-04-06 DIAGNOSIS — Z23 Encounter for immunization: Secondary | ICD-10-CM | POA: Diagnosis not present

## 2018-04-11 DIAGNOSIS — E119 Type 2 diabetes mellitus without complications: Secondary | ICD-10-CM | POA: Diagnosis not present

## 2018-04-11 DIAGNOSIS — I10 Essential (primary) hypertension: Secondary | ICD-10-CM | POA: Diagnosis not present

## 2018-04-26 DIAGNOSIS — E1165 Type 2 diabetes mellitus with hyperglycemia: Secondary | ICD-10-CM | POA: Diagnosis not present

## 2018-04-26 DIAGNOSIS — M159 Polyosteoarthritis, unspecified: Secondary | ICD-10-CM | POA: Diagnosis not present

## 2018-04-26 DIAGNOSIS — I10 Essential (primary) hypertension: Secondary | ICD-10-CM | POA: Diagnosis not present

## 2018-04-26 DIAGNOSIS — Z6828 Body mass index (BMI) 28.0-28.9, adult: Secondary | ICD-10-CM | POA: Diagnosis not present

## 2018-04-26 DIAGNOSIS — Z299 Encounter for prophylactic measures, unspecified: Secondary | ICD-10-CM | POA: Diagnosis not present

## 2018-04-26 DIAGNOSIS — E1142 Type 2 diabetes mellitus with diabetic polyneuropathy: Secondary | ICD-10-CM | POA: Diagnosis not present

## 2018-05-03 DIAGNOSIS — Z6828 Body mass index (BMI) 28.0-28.9, adult: Secondary | ICD-10-CM | POA: Diagnosis not present

## 2018-05-03 DIAGNOSIS — E1165 Type 2 diabetes mellitus with hyperglycemia: Secondary | ICD-10-CM | POA: Diagnosis not present

## 2018-05-03 DIAGNOSIS — M1712 Unilateral primary osteoarthritis, left knee: Secondary | ICD-10-CM | POA: Diagnosis not present

## 2018-05-03 DIAGNOSIS — M7062 Trochanteric bursitis, left hip: Secondary | ICD-10-CM | POA: Diagnosis not present

## 2018-05-03 DIAGNOSIS — I10 Essential (primary) hypertension: Secondary | ICD-10-CM | POA: Diagnosis not present

## 2018-05-03 DIAGNOSIS — M85862 Other specified disorders of bone density and structure, left lower leg: Secondary | ICD-10-CM | POA: Diagnosis not present

## 2018-05-03 DIAGNOSIS — M7061 Trochanteric bursitis, right hip: Secondary | ICD-10-CM | POA: Diagnosis not present

## 2018-05-03 DIAGNOSIS — Z299 Encounter for prophylactic measures, unspecified: Secondary | ICD-10-CM | POA: Diagnosis not present

## 2018-05-03 DIAGNOSIS — M25562 Pain in left knee: Secondary | ICD-10-CM | POA: Diagnosis not present

## 2018-05-25 DIAGNOSIS — Z299 Encounter for prophylactic measures, unspecified: Secondary | ICD-10-CM | POA: Diagnosis not present

## 2018-05-25 DIAGNOSIS — E1165 Type 2 diabetes mellitus with hyperglycemia: Secondary | ICD-10-CM | POA: Diagnosis not present

## 2018-05-25 DIAGNOSIS — T148XXA Other injury of unspecified body region, initial encounter: Secondary | ICD-10-CM | POA: Diagnosis not present

## 2018-05-25 DIAGNOSIS — Z6828 Body mass index (BMI) 28.0-28.9, adult: Secondary | ICD-10-CM | POA: Diagnosis not present

## 2018-05-25 DIAGNOSIS — I10 Essential (primary) hypertension: Secondary | ICD-10-CM | POA: Diagnosis not present

## 2018-05-29 DIAGNOSIS — Z961 Presence of intraocular lens: Secondary | ICD-10-CM | POA: Diagnosis not present

## 2018-05-29 DIAGNOSIS — H401432 Capsular glaucoma with pseudoexfoliation of lens, bilateral, moderate stage: Secondary | ICD-10-CM | POA: Diagnosis not present

## 2018-06-04 DIAGNOSIS — I10 Essential (primary) hypertension: Secondary | ICD-10-CM | POA: Diagnosis not present

## 2018-06-04 DIAGNOSIS — Z6828 Body mass index (BMI) 28.0-28.9, adult: Secondary | ICD-10-CM | POA: Diagnosis not present

## 2018-06-04 DIAGNOSIS — J069 Acute upper respiratory infection, unspecified: Secondary | ICD-10-CM | POA: Diagnosis not present

## 2018-06-04 DIAGNOSIS — Z299 Encounter for prophylactic measures, unspecified: Secondary | ICD-10-CM | POA: Diagnosis not present

## 2018-06-14 DIAGNOSIS — M1712 Unilateral primary osteoarthritis, left knee: Secondary | ICD-10-CM | POA: Diagnosis not present

## 2018-06-14 DIAGNOSIS — M25562 Pain in left knee: Secondary | ICD-10-CM | POA: Diagnosis not present

## 2018-06-14 DIAGNOSIS — R260 Ataxic gait: Secondary | ICD-10-CM | POA: Diagnosis not present

## 2018-06-15 DIAGNOSIS — M1712 Unilateral primary osteoarthritis, left knee: Secondary | ICD-10-CM | POA: Diagnosis not present

## 2018-06-15 DIAGNOSIS — M25562 Pain in left knee: Secondary | ICD-10-CM | POA: Diagnosis not present

## 2018-06-15 DIAGNOSIS — R260 Ataxic gait: Secondary | ICD-10-CM | POA: Diagnosis not present

## 2018-06-21 DIAGNOSIS — M1712 Unilateral primary osteoarthritis, left knee: Secondary | ICD-10-CM | POA: Diagnosis not present

## 2018-06-21 DIAGNOSIS — R260 Ataxic gait: Secondary | ICD-10-CM | POA: Diagnosis not present

## 2018-06-21 DIAGNOSIS — M25562 Pain in left knee: Secondary | ICD-10-CM | POA: Diagnosis not present

## 2018-06-21 DIAGNOSIS — M79605 Pain in left leg: Secondary | ICD-10-CM | POA: Diagnosis not present

## 2018-06-27 DIAGNOSIS — E1140 Type 2 diabetes mellitus with diabetic neuropathy, unspecified: Secondary | ICD-10-CM | POA: Diagnosis not present

## 2018-06-27 DIAGNOSIS — E1151 Type 2 diabetes mellitus with diabetic peripheral angiopathy without gangrene: Secondary | ICD-10-CM | POA: Diagnosis not present

## 2018-06-29 DIAGNOSIS — M170 Bilateral primary osteoarthritis of knee: Secondary | ICD-10-CM | POA: Diagnosis not present

## 2018-06-29 DIAGNOSIS — M1712 Unilateral primary osteoarthritis, left knee: Secondary | ICD-10-CM | POA: Diagnosis not present

## 2018-06-29 DIAGNOSIS — R260 Ataxic gait: Secondary | ICD-10-CM | POA: Diagnosis not present

## 2018-07-06 DIAGNOSIS — R260 Ataxic gait: Secondary | ICD-10-CM | POA: Diagnosis not present

## 2018-07-06 DIAGNOSIS — Z6828 Body mass index (BMI) 28.0-28.9, adult: Secondary | ICD-10-CM | POA: Diagnosis not present

## 2018-07-06 DIAGNOSIS — I10 Essential (primary) hypertension: Secondary | ICD-10-CM | POA: Diagnosis not present

## 2018-07-06 DIAGNOSIS — M1712 Unilateral primary osteoarthritis, left knee: Secondary | ICD-10-CM | POA: Diagnosis not present

## 2018-07-06 DIAGNOSIS — Z299 Encounter for prophylactic measures, unspecified: Secondary | ICD-10-CM | POA: Diagnosis not present

## 2018-07-06 DIAGNOSIS — M25562 Pain in left knee: Secondary | ICD-10-CM | POA: Diagnosis not present

## 2018-07-13 DIAGNOSIS — E1165 Type 2 diabetes mellitus with hyperglycemia: Secondary | ICD-10-CM | POA: Diagnosis not present

## 2018-07-13 DIAGNOSIS — J069 Acute upper respiratory infection, unspecified: Secondary | ICD-10-CM | POA: Diagnosis not present

## 2018-07-13 DIAGNOSIS — E1142 Type 2 diabetes mellitus with diabetic polyneuropathy: Secondary | ICD-10-CM | POA: Diagnosis not present

## 2018-07-13 DIAGNOSIS — I10 Essential (primary) hypertension: Secondary | ICD-10-CM | POA: Diagnosis not present

## 2018-07-13 DIAGNOSIS — Z299 Encounter for prophylactic measures, unspecified: Secondary | ICD-10-CM | POA: Diagnosis not present

## 2018-07-13 DIAGNOSIS — Z6828 Body mass index (BMI) 28.0-28.9, adult: Secondary | ICD-10-CM | POA: Diagnosis not present

## 2018-07-20 DIAGNOSIS — Z1231 Encounter for screening mammogram for malignant neoplasm of breast: Secondary | ICD-10-CM | POA: Diagnosis not present

## 2018-07-31 DIAGNOSIS — H401432 Capsular glaucoma with pseudoexfoliation of lens, bilateral, moderate stage: Secondary | ICD-10-CM | POA: Diagnosis not present

## 2018-08-02 DIAGNOSIS — E1165 Type 2 diabetes mellitus with hyperglycemia: Secondary | ICD-10-CM | POA: Diagnosis not present

## 2018-08-02 DIAGNOSIS — Z6828 Body mass index (BMI) 28.0-28.9, adult: Secondary | ICD-10-CM | POA: Diagnosis not present

## 2018-08-02 DIAGNOSIS — J069 Acute upper respiratory infection, unspecified: Secondary | ICD-10-CM | POA: Diagnosis not present

## 2018-08-02 DIAGNOSIS — Z299 Encounter for prophylactic measures, unspecified: Secondary | ICD-10-CM | POA: Diagnosis not present

## 2018-08-02 DIAGNOSIS — E1142 Type 2 diabetes mellitus with diabetic polyneuropathy: Secondary | ICD-10-CM | POA: Diagnosis not present

## 2018-08-02 DIAGNOSIS — I10 Essential (primary) hypertension: Secondary | ICD-10-CM | POA: Diagnosis not present

## 2018-08-21 DIAGNOSIS — E1165 Type 2 diabetes mellitus with hyperglycemia: Secondary | ICD-10-CM | POA: Diagnosis not present

## 2018-08-21 DIAGNOSIS — I10 Essential (primary) hypertension: Secondary | ICD-10-CM | POA: Diagnosis not present

## 2018-08-21 DIAGNOSIS — E7800 Pure hypercholesterolemia, unspecified: Secondary | ICD-10-CM | POA: Diagnosis not present

## 2018-08-21 DIAGNOSIS — Z1211 Encounter for screening for malignant neoplasm of colon: Secondary | ICD-10-CM | POA: Diagnosis not present

## 2018-08-21 DIAGNOSIS — R5383 Other fatigue: Secondary | ICD-10-CM | POA: Diagnosis not present

## 2018-08-21 DIAGNOSIS — Z79899 Other long term (current) drug therapy: Secondary | ICD-10-CM | POA: Diagnosis not present

## 2018-08-21 DIAGNOSIS — Z6828 Body mass index (BMI) 28.0-28.9, adult: Secondary | ICD-10-CM | POA: Diagnosis not present

## 2018-08-21 DIAGNOSIS — Z1331 Encounter for screening for depression: Secondary | ICD-10-CM | POA: Diagnosis not present

## 2018-08-21 DIAGNOSIS — Z7189 Other specified counseling: Secondary | ICD-10-CM | POA: Diagnosis not present

## 2018-08-21 DIAGNOSIS — Z0000 Encounter for general adult medical examination without abnormal findings: Secondary | ICD-10-CM | POA: Diagnosis not present

## 2018-08-21 DIAGNOSIS — Z789 Other specified health status: Secondary | ICD-10-CM | POA: Diagnosis not present

## 2018-08-21 DIAGNOSIS — Z1339 Encounter for screening examination for other mental health and behavioral disorders: Secondary | ICD-10-CM | POA: Diagnosis not present

## 2018-08-21 DIAGNOSIS — Z299 Encounter for prophylactic measures, unspecified: Secondary | ICD-10-CM | POA: Diagnosis not present

## 2018-08-22 DIAGNOSIS — I10 Essential (primary) hypertension: Secondary | ICD-10-CM | POA: Diagnosis not present

## 2018-08-22 DIAGNOSIS — E1142 Type 2 diabetes mellitus with diabetic polyneuropathy: Secondary | ICD-10-CM | POA: Diagnosis not present

## 2018-08-22 DIAGNOSIS — M1712 Unilateral primary osteoarthritis, left knee: Secondary | ICD-10-CM | POA: Diagnosis not present

## 2018-08-22 DIAGNOSIS — Z299 Encounter for prophylactic measures, unspecified: Secondary | ICD-10-CM | POA: Diagnosis not present

## 2018-08-22 DIAGNOSIS — Z6833 Body mass index (BMI) 33.0-33.9, adult: Secondary | ICD-10-CM | POA: Diagnosis not present

## 2018-08-22 DIAGNOSIS — E1165 Type 2 diabetes mellitus with hyperglycemia: Secondary | ICD-10-CM | POA: Diagnosis not present

## 2018-08-30 DIAGNOSIS — E1165 Type 2 diabetes mellitus with hyperglycemia: Secondary | ICD-10-CM | POA: Diagnosis not present

## 2018-08-30 DIAGNOSIS — Z299 Encounter for prophylactic measures, unspecified: Secondary | ICD-10-CM | POA: Diagnosis not present

## 2018-08-30 DIAGNOSIS — I10 Essential (primary) hypertension: Secondary | ICD-10-CM | POA: Diagnosis not present

## 2018-08-30 DIAGNOSIS — M1712 Unilateral primary osteoarthritis, left knee: Secondary | ICD-10-CM | POA: Diagnosis not present

## 2018-08-30 DIAGNOSIS — Z6833 Body mass index (BMI) 33.0-33.9, adult: Secondary | ICD-10-CM | POA: Diagnosis not present

## 2018-08-30 DIAGNOSIS — E1142 Type 2 diabetes mellitus with diabetic polyneuropathy: Secondary | ICD-10-CM | POA: Diagnosis not present

## 2018-08-30 DIAGNOSIS — M170 Bilateral primary osteoarthritis of knee: Secondary | ICD-10-CM | POA: Diagnosis not present

## 2018-09-07 DIAGNOSIS — Z6833 Body mass index (BMI) 33.0-33.9, adult: Secondary | ICD-10-CM | POA: Diagnosis not present

## 2018-09-07 DIAGNOSIS — M170 Bilateral primary osteoarthritis of knee: Secondary | ICD-10-CM | POA: Diagnosis not present

## 2018-09-07 DIAGNOSIS — E1165 Type 2 diabetes mellitus with hyperglycemia: Secondary | ICD-10-CM | POA: Diagnosis not present

## 2018-09-07 DIAGNOSIS — I10 Essential (primary) hypertension: Secondary | ICD-10-CM | POA: Diagnosis not present

## 2018-09-07 DIAGNOSIS — E1142 Type 2 diabetes mellitus with diabetic polyneuropathy: Secondary | ICD-10-CM | POA: Diagnosis not present

## 2018-09-07 DIAGNOSIS — Z299 Encounter for prophylactic measures, unspecified: Secondary | ICD-10-CM | POA: Diagnosis not present

## 2018-09-17 DIAGNOSIS — E1151 Type 2 diabetes mellitus with diabetic peripheral angiopathy without gangrene: Secondary | ICD-10-CM | POA: Diagnosis not present

## 2018-09-17 DIAGNOSIS — E1140 Type 2 diabetes mellitus with diabetic neuropathy, unspecified: Secondary | ICD-10-CM | POA: Diagnosis not present

## 2018-11-06 DIAGNOSIS — E1142 Type 2 diabetes mellitus with diabetic polyneuropathy: Secondary | ICD-10-CM | POA: Diagnosis not present

## 2018-11-06 DIAGNOSIS — Z299 Encounter for prophylactic measures, unspecified: Secondary | ICD-10-CM | POA: Diagnosis not present

## 2018-11-06 DIAGNOSIS — I10 Essential (primary) hypertension: Secondary | ICD-10-CM | POA: Diagnosis not present

## 2018-11-06 DIAGNOSIS — R42 Dizziness and giddiness: Secondary | ICD-10-CM | POA: Diagnosis not present

## 2018-11-06 DIAGNOSIS — E1165 Type 2 diabetes mellitus with hyperglycemia: Secondary | ICD-10-CM | POA: Diagnosis not present

## 2018-11-06 DIAGNOSIS — Z6833 Body mass index (BMI) 33.0-33.9, adult: Secondary | ICD-10-CM | POA: Diagnosis not present

## 2018-11-08 DIAGNOSIS — I10 Essential (primary) hypertension: Secondary | ICD-10-CM | POA: Diagnosis not present

## 2018-12-07 DIAGNOSIS — I10 Essential (primary) hypertension: Secondary | ICD-10-CM | POA: Diagnosis not present

## 2019-01-07 DIAGNOSIS — I10 Essential (primary) hypertension: Secondary | ICD-10-CM | POA: Diagnosis not present

## 2019-02-06 DIAGNOSIS — I10 Essential (primary) hypertension: Secondary | ICD-10-CM | POA: Diagnosis not present

## 2019-02-13 DIAGNOSIS — I10 Essential (primary) hypertension: Secondary | ICD-10-CM | POA: Diagnosis not present

## 2019-02-13 DIAGNOSIS — Z299 Encounter for prophylactic measures, unspecified: Secondary | ICD-10-CM | POA: Diagnosis not present

## 2019-02-13 DIAGNOSIS — E1142 Type 2 diabetes mellitus with diabetic polyneuropathy: Secondary | ICD-10-CM | POA: Diagnosis not present

## 2019-02-13 DIAGNOSIS — Z6833 Body mass index (BMI) 33.0-33.9, adult: Secondary | ICD-10-CM | POA: Diagnosis not present

## 2019-02-13 DIAGNOSIS — E1165 Type 2 diabetes mellitus with hyperglycemia: Secondary | ICD-10-CM | POA: Diagnosis not present

## 2019-02-14 DIAGNOSIS — M419 Scoliosis, unspecified: Secondary | ICD-10-CM | POA: Diagnosis not present

## 2019-02-14 DIAGNOSIS — N183 Chronic kidney disease, stage 3 (moderate): Secondary | ICD-10-CM | POA: Diagnosis not present

## 2019-02-14 DIAGNOSIS — Z6833 Body mass index (BMI) 33.0-33.9, adult: Secondary | ICD-10-CM | POA: Diagnosis not present

## 2019-02-14 DIAGNOSIS — M1612 Unilateral primary osteoarthritis, left hip: Secondary | ICD-10-CM | POA: Diagnosis not present

## 2019-02-14 DIAGNOSIS — R202 Paresthesia of skin: Secondary | ICD-10-CM | POA: Diagnosis not present

## 2019-02-14 DIAGNOSIS — M25552 Pain in left hip: Secondary | ICD-10-CM | POA: Diagnosis not present

## 2019-02-14 DIAGNOSIS — R200 Anesthesia of skin: Secondary | ICD-10-CM | POA: Diagnosis not present

## 2019-02-14 DIAGNOSIS — Z299 Encounter for prophylactic measures, unspecified: Secondary | ICD-10-CM | POA: Diagnosis not present

## 2019-02-14 DIAGNOSIS — R252 Cramp and spasm: Secondary | ICD-10-CM | POA: Diagnosis not present

## 2019-02-14 DIAGNOSIS — R413 Other amnesia: Secondary | ICD-10-CM | POA: Diagnosis not present

## 2019-02-14 DIAGNOSIS — R5383 Other fatigue: Secondary | ICD-10-CM | POA: Diagnosis not present

## 2019-02-14 DIAGNOSIS — M545 Low back pain: Secondary | ICD-10-CM | POA: Diagnosis not present

## 2019-02-27 DIAGNOSIS — Z299 Encounter for prophylactic measures, unspecified: Secondary | ICD-10-CM | POA: Diagnosis not present

## 2019-02-27 DIAGNOSIS — Z713 Dietary counseling and surveillance: Secondary | ICD-10-CM | POA: Diagnosis not present

## 2019-02-27 DIAGNOSIS — E1165 Type 2 diabetes mellitus with hyperglycemia: Secondary | ICD-10-CM | POA: Diagnosis not present

## 2019-02-27 DIAGNOSIS — Z789 Other specified health status: Secondary | ICD-10-CM | POA: Diagnosis not present

## 2019-02-28 DIAGNOSIS — E1140 Type 2 diabetes mellitus with diabetic neuropathy, unspecified: Secondary | ICD-10-CM | POA: Diagnosis not present

## 2019-02-28 DIAGNOSIS — E1151 Type 2 diabetes mellitus with diabetic peripheral angiopathy without gangrene: Secondary | ICD-10-CM | POA: Diagnosis not present

## 2019-03-06 DIAGNOSIS — E1142 Type 2 diabetes mellitus with diabetic polyneuropathy: Secondary | ICD-10-CM | POA: Diagnosis not present

## 2019-03-06 DIAGNOSIS — Z299 Encounter for prophylactic measures, unspecified: Secondary | ICD-10-CM | POA: Diagnosis not present

## 2019-03-06 DIAGNOSIS — I10 Essential (primary) hypertension: Secondary | ICD-10-CM | POA: Diagnosis not present

## 2019-03-06 DIAGNOSIS — N184 Chronic kidney disease, stage 4 (severe): Secondary | ICD-10-CM | POA: Diagnosis not present

## 2019-03-06 DIAGNOSIS — Z6834 Body mass index (BMI) 34.0-34.9, adult: Secondary | ICD-10-CM | POA: Diagnosis not present

## 2019-03-06 DIAGNOSIS — E1165 Type 2 diabetes mellitus with hyperglycemia: Secondary | ICD-10-CM | POA: Diagnosis not present

## 2019-03-07 DIAGNOSIS — H0288B Meibomian gland dysfunction left eye, upper and lower eyelids: Secondary | ICD-10-CM | POA: Diagnosis not present

## 2019-03-07 DIAGNOSIS — H00021 Hordeolum internum right upper eyelid: Secondary | ICD-10-CM | POA: Diagnosis not present

## 2019-03-07 DIAGNOSIS — Z961 Presence of intraocular lens: Secondary | ICD-10-CM | POA: Diagnosis not present

## 2019-03-07 DIAGNOSIS — H401432 Capsular glaucoma with pseudoexfoliation of lens, bilateral, moderate stage: Secondary | ICD-10-CM | POA: Diagnosis not present

## 2019-03-07 DIAGNOSIS — H0288A Meibomian gland dysfunction right eye, upper and lower eyelids: Secondary | ICD-10-CM | POA: Diagnosis not present

## 2019-03-11 DIAGNOSIS — I10 Essential (primary) hypertension: Secondary | ICD-10-CM | POA: Diagnosis not present

## 2019-03-12 DIAGNOSIS — Z299 Encounter for prophylactic measures, unspecified: Secondary | ICD-10-CM | POA: Diagnosis not present

## 2019-03-12 DIAGNOSIS — Z713 Dietary counseling and surveillance: Secondary | ICD-10-CM | POA: Diagnosis not present

## 2019-03-12 DIAGNOSIS — R413 Other amnesia: Secondary | ICD-10-CM | POA: Diagnosis not present

## 2019-03-12 DIAGNOSIS — Z6832 Body mass index (BMI) 32.0-32.9, adult: Secondary | ICD-10-CM | POA: Diagnosis not present

## 2019-03-12 DIAGNOSIS — F329 Major depressive disorder, single episode, unspecified: Secondary | ICD-10-CM | POA: Diagnosis not present

## 2019-04-08 DIAGNOSIS — H401432 Capsular glaucoma with pseudoexfoliation of lens, bilateral, moderate stage: Secondary | ICD-10-CM | POA: Diagnosis not present

## 2019-04-08 DIAGNOSIS — I10 Essential (primary) hypertension: Secondary | ICD-10-CM | POA: Diagnosis not present

## 2019-04-08 DIAGNOSIS — H0288A Meibomian gland dysfunction right eye, upper and lower eyelids: Secondary | ICD-10-CM | POA: Diagnosis not present

## 2019-04-08 DIAGNOSIS — H00021 Hordeolum internum right upper eyelid: Secondary | ICD-10-CM | POA: Diagnosis not present

## 2019-04-08 DIAGNOSIS — H0288B Meibomian gland dysfunction left eye, upper and lower eyelids: Secondary | ICD-10-CM | POA: Diagnosis not present

## 2019-04-08 DIAGNOSIS — Z961 Presence of intraocular lens: Secondary | ICD-10-CM | POA: Diagnosis not present

## 2019-05-07 DIAGNOSIS — I10 Essential (primary) hypertension: Secondary | ICD-10-CM | POA: Diagnosis not present

## 2019-05-13 DIAGNOSIS — F329 Major depressive disorder, single episode, unspecified: Secondary | ICD-10-CM | POA: Diagnosis not present

## 2019-05-13 DIAGNOSIS — N184 Chronic kidney disease, stage 4 (severe): Secondary | ICD-10-CM | POA: Diagnosis not present

## 2019-05-13 DIAGNOSIS — I10 Essential (primary) hypertension: Secondary | ICD-10-CM | POA: Diagnosis not present

## 2019-05-13 DIAGNOSIS — Z6833 Body mass index (BMI) 33.0-33.9, adult: Secondary | ICD-10-CM | POA: Diagnosis not present

## 2019-05-13 DIAGNOSIS — Z1331 Encounter for screening for depression: Secondary | ICD-10-CM | POA: Diagnosis not present

## 2019-05-13 DIAGNOSIS — G4700 Insomnia, unspecified: Secondary | ICD-10-CM | POA: Diagnosis not present

## 2019-05-13 DIAGNOSIS — Z299 Encounter for prophylactic measures, unspecified: Secondary | ICD-10-CM | POA: Diagnosis not present

## 2019-05-13 DIAGNOSIS — E1165 Type 2 diabetes mellitus with hyperglycemia: Secondary | ICD-10-CM | POA: Diagnosis not present

## 2019-05-21 ENCOUNTER — Encounter: Payer: Self-pay | Admitting: Cardiovascular Disease

## 2019-05-21 DIAGNOSIS — E1122 Type 2 diabetes mellitus with diabetic chronic kidney disease: Secondary | ICD-10-CM | POA: Diagnosis not present

## 2019-05-21 DIAGNOSIS — M25519 Pain in unspecified shoulder: Secondary | ICD-10-CM | POA: Diagnosis not present

## 2019-05-21 DIAGNOSIS — I10 Essential (primary) hypertension: Secondary | ICD-10-CM | POA: Diagnosis not present

## 2019-05-21 DIAGNOSIS — E1165 Type 2 diabetes mellitus with hyperglycemia: Secondary | ICD-10-CM | POA: Diagnosis not present

## 2019-05-21 DIAGNOSIS — E1142 Type 2 diabetes mellitus with diabetic polyneuropathy: Secondary | ICD-10-CM | POA: Diagnosis not present

## 2019-05-21 DIAGNOSIS — Z299 Encounter for prophylactic measures, unspecified: Secondary | ICD-10-CM | POA: Diagnosis not present

## 2019-05-29 DIAGNOSIS — M25511 Pain in right shoulder: Secondary | ICD-10-CM | POA: Diagnosis not present

## 2019-05-29 DIAGNOSIS — M25512 Pain in left shoulder: Secondary | ICD-10-CM | POA: Diagnosis not present

## 2019-06-03 DIAGNOSIS — M25511 Pain in right shoulder: Secondary | ICD-10-CM | POA: Diagnosis not present

## 2019-06-03 DIAGNOSIS — M25512 Pain in left shoulder: Secondary | ICD-10-CM | POA: Diagnosis not present

## 2019-06-05 DIAGNOSIS — M25512 Pain in left shoulder: Secondary | ICD-10-CM | POA: Diagnosis not present

## 2019-06-05 DIAGNOSIS — M25511 Pain in right shoulder: Secondary | ICD-10-CM | POA: Diagnosis not present

## 2019-06-10 DIAGNOSIS — M25512 Pain in left shoulder: Secondary | ICD-10-CM | POA: Diagnosis not present

## 2019-06-10 DIAGNOSIS — M25511 Pain in right shoulder: Secondary | ICD-10-CM | POA: Diagnosis not present

## 2019-06-12 DIAGNOSIS — M25511 Pain in right shoulder: Secondary | ICD-10-CM | POA: Diagnosis not present

## 2019-06-12 DIAGNOSIS — M25512 Pain in left shoulder: Secondary | ICD-10-CM | POA: Diagnosis not present

## 2019-06-13 DIAGNOSIS — E1122 Type 2 diabetes mellitus with diabetic chronic kidney disease: Secondary | ICD-10-CM | POA: Diagnosis not present

## 2019-06-13 DIAGNOSIS — Z299 Encounter for prophylactic measures, unspecified: Secondary | ICD-10-CM | POA: Diagnosis not present

## 2019-06-13 DIAGNOSIS — E1142 Type 2 diabetes mellitus with diabetic polyneuropathy: Secondary | ICD-10-CM | POA: Diagnosis not present

## 2019-06-13 DIAGNOSIS — Z6833 Body mass index (BMI) 33.0-33.9, adult: Secondary | ICD-10-CM | POA: Diagnosis not present

## 2019-06-13 DIAGNOSIS — G4700 Insomnia, unspecified: Secondary | ICD-10-CM | POA: Diagnosis not present

## 2019-06-13 DIAGNOSIS — E1165 Type 2 diabetes mellitus with hyperglycemia: Secondary | ICD-10-CM | POA: Diagnosis not present

## 2019-06-13 DIAGNOSIS — I10 Essential (primary) hypertension: Secondary | ICD-10-CM | POA: Diagnosis not present

## 2019-06-17 DIAGNOSIS — M25511 Pain in right shoulder: Secondary | ICD-10-CM | POA: Diagnosis not present

## 2019-06-17 DIAGNOSIS — Z6834 Body mass index (BMI) 34.0-34.9, adult: Secondary | ICD-10-CM | POA: Diagnosis not present

## 2019-06-17 DIAGNOSIS — M25512 Pain in left shoulder: Secondary | ICD-10-CM | POA: Diagnosis not present

## 2019-06-17 DIAGNOSIS — M533 Sacrococcygeal disorders, not elsewhere classified: Secondary | ICD-10-CM | POA: Diagnosis not present

## 2019-06-17 DIAGNOSIS — Z713 Dietary counseling and surveillance: Secondary | ICD-10-CM | POA: Diagnosis not present

## 2019-06-17 DIAGNOSIS — Z299 Encounter for prophylactic measures, unspecified: Secondary | ICD-10-CM | POA: Diagnosis not present

## 2019-06-17 DIAGNOSIS — I10 Essential (primary) hypertension: Secondary | ICD-10-CM | POA: Diagnosis not present

## 2019-06-24 DIAGNOSIS — M25511 Pain in right shoulder: Secondary | ICD-10-CM | POA: Diagnosis not present

## 2019-06-24 DIAGNOSIS — M25512 Pain in left shoulder: Secondary | ICD-10-CM | POA: Diagnosis not present

## 2019-06-26 DIAGNOSIS — M25511 Pain in right shoulder: Secondary | ICD-10-CM | POA: Diagnosis not present

## 2019-06-26 DIAGNOSIS — M25512 Pain in left shoulder: Secondary | ICD-10-CM | POA: Diagnosis not present

## 2019-07-08 DIAGNOSIS — I10 Essential (primary) hypertension: Secondary | ICD-10-CM | POA: Diagnosis not present

## 2019-07-08 DIAGNOSIS — E119 Type 2 diabetes mellitus without complications: Secondary | ICD-10-CM | POA: Diagnosis not present

## 2019-07-10 DIAGNOSIS — I10 Essential (primary) hypertension: Secondary | ICD-10-CM | POA: Diagnosis not present

## 2019-07-15 DIAGNOSIS — Z1159 Encounter for screening for other viral diseases: Secondary | ICD-10-CM | POA: Diagnosis not present

## 2019-07-15 DIAGNOSIS — Z299 Encounter for prophylactic measures, unspecified: Secondary | ICD-10-CM | POA: Diagnosis not present

## 2019-07-15 DIAGNOSIS — Z789 Other specified health status: Secondary | ICD-10-CM | POA: Diagnosis not present

## 2019-07-15 DIAGNOSIS — G4700 Insomnia, unspecified: Secondary | ICD-10-CM | POA: Diagnosis not present

## 2019-07-15 DIAGNOSIS — I10 Essential (primary) hypertension: Secondary | ICD-10-CM | POA: Diagnosis not present

## 2019-07-15 DIAGNOSIS — E1122 Type 2 diabetes mellitus with diabetic chronic kidney disease: Secondary | ICD-10-CM | POA: Diagnosis not present

## 2019-07-15 DIAGNOSIS — Z6834 Body mass index (BMI) 34.0-34.9, adult: Secondary | ICD-10-CM | POA: Diagnosis not present

## 2019-07-15 DIAGNOSIS — J019 Acute sinusitis, unspecified: Secondary | ICD-10-CM | POA: Diagnosis not present

## 2019-07-18 DIAGNOSIS — I10 Essential (primary) hypertension: Secondary | ICD-10-CM | POA: Diagnosis not present

## 2019-07-18 DIAGNOSIS — E119 Type 2 diabetes mellitus without complications: Secondary | ICD-10-CM | POA: Diagnosis not present

## 2019-07-29 ENCOUNTER — Other Ambulatory Visit: Payer: Self-pay

## 2019-07-29 ENCOUNTER — Ambulatory Visit: Payer: Medicare Other | Attending: Internal Medicine

## 2019-07-29 DIAGNOSIS — Z87891 Personal history of nicotine dependence: Secondary | ICD-10-CM | POA: Diagnosis not present

## 2019-07-29 DIAGNOSIS — Z20822 Contact with and (suspected) exposure to covid-19: Secondary | ICD-10-CM

## 2019-07-29 DIAGNOSIS — E1165 Type 2 diabetes mellitus with hyperglycemia: Secondary | ICD-10-CM | POA: Diagnosis not present

## 2019-07-29 DIAGNOSIS — E1122 Type 2 diabetes mellitus with diabetic chronic kidney disease: Secondary | ICD-10-CM | POA: Diagnosis not present

## 2019-07-29 DIAGNOSIS — Z6834 Body mass index (BMI) 34.0-34.9, adult: Secondary | ICD-10-CM | POA: Diagnosis not present

## 2019-07-29 DIAGNOSIS — Z299 Encounter for prophylactic measures, unspecified: Secondary | ICD-10-CM | POA: Diagnosis not present

## 2019-07-29 DIAGNOSIS — E1142 Type 2 diabetes mellitus with diabetic polyneuropathy: Secondary | ICD-10-CM | POA: Diagnosis not present

## 2019-07-30 LAB — NOVEL CORONAVIRUS, NAA: SARS-CoV-2, NAA: NOT DETECTED

## 2019-08-02 ENCOUNTER — Telehealth: Payer: Self-pay | Admitting: *Deleted

## 2019-08-02 DIAGNOSIS — Z6834 Body mass index (BMI) 34.0-34.9, adult: Secondary | ICD-10-CM | POA: Diagnosis not present

## 2019-08-02 DIAGNOSIS — Z299 Encounter for prophylactic measures, unspecified: Secondary | ICD-10-CM | POA: Diagnosis not present

## 2019-08-02 DIAGNOSIS — Z713 Dietary counseling and surveillance: Secondary | ICD-10-CM | POA: Diagnosis not present

## 2019-08-02 DIAGNOSIS — J069 Acute upper respiratory infection, unspecified: Secondary | ICD-10-CM | POA: Diagnosis not present

## 2019-08-02 DIAGNOSIS — R05 Cough: Secondary | ICD-10-CM | POA: Diagnosis not present

## 2019-08-02 NOTE — Telephone Encounter (Signed)
Patient calling for COVID result- notified negative

## 2019-08-06 DIAGNOSIS — Z6834 Body mass index (BMI) 34.0-34.9, adult: Secondary | ICD-10-CM | POA: Diagnosis not present

## 2019-08-06 DIAGNOSIS — Z299 Encounter for prophylactic measures, unspecified: Secondary | ICD-10-CM | POA: Diagnosis not present

## 2019-08-06 DIAGNOSIS — J069 Acute upper respiratory infection, unspecified: Secondary | ICD-10-CM | POA: Diagnosis not present

## 2019-08-06 DIAGNOSIS — G4700 Insomnia, unspecified: Secondary | ICD-10-CM | POA: Diagnosis not present

## 2019-08-06 DIAGNOSIS — Z789 Other specified health status: Secondary | ICD-10-CM | POA: Diagnosis not present

## 2019-08-06 DIAGNOSIS — N183 Chronic kidney disease, stage 3 unspecified: Secondary | ICD-10-CM | POA: Diagnosis not present

## 2019-08-06 DIAGNOSIS — E1122 Type 2 diabetes mellitus with diabetic chronic kidney disease: Secondary | ICD-10-CM | POA: Diagnosis not present

## 2019-08-08 ENCOUNTER — Encounter: Payer: Self-pay | Admitting: *Deleted

## 2019-08-08 ENCOUNTER — Telehealth: Payer: Self-pay | Admitting: Cardiovascular Disease

## 2019-08-08 NOTE — Telephone Encounter (Signed)
PAST two week she has been very short of breath.   And stated she is having a really hard time taking a deep breath

## 2019-08-08 NOTE — Telephone Encounter (Signed)
States that she has been seeing her pmd since early December with SOB & cough.   covid test negative x 2   No c/o active chest pain - feels like pressure and something is tight around her neck.  SOB x 2 weeks - worse with walking.  Feels like something in her throat is swollen.  States that pmd give her medication for thrush in her mouth today.  No fever.  Office visit scheduled for 08/19/2019 with Estella Husk, PA in [place] office.  Advised her to go to [facility] ED in the meantime should any symptoms worsen.  She verbalized understanding.   Office note requested from pmd now.

## 2019-08-09 DIAGNOSIS — I10 Essential (primary) hypertension: Secondary | ICD-10-CM | POA: Diagnosis not present

## 2019-08-19 ENCOUNTER — Ambulatory Visit: Payer: Medicare Other | Admitting: Physician Assistant

## 2019-08-21 DIAGNOSIS — I10 Essential (primary) hypertension: Secondary | ICD-10-CM | POA: Diagnosis not present

## 2019-08-21 DIAGNOSIS — E119 Type 2 diabetes mellitus without complications: Secondary | ICD-10-CM | POA: Diagnosis not present

## 2019-08-27 DIAGNOSIS — Z0000 Encounter for general adult medical examination without abnormal findings: Secondary | ICD-10-CM | POA: Diagnosis not present

## 2019-08-27 DIAGNOSIS — I700 Atherosclerosis of aorta: Secondary | ICD-10-CM | POA: Diagnosis not present

## 2019-08-27 DIAGNOSIS — Z7189 Other specified counseling: Secondary | ICD-10-CM | POA: Diagnosis not present

## 2019-08-27 DIAGNOSIS — Z1339 Encounter for screening examination for other mental health and behavioral disorders: Secondary | ICD-10-CM | POA: Diagnosis not present

## 2019-08-27 DIAGNOSIS — J209 Acute bronchitis, unspecified: Secondary | ICD-10-CM | POA: Diagnosis not present

## 2019-08-27 DIAGNOSIS — R918 Other nonspecific abnormal finding of lung field: Secondary | ICD-10-CM | POA: Diagnosis not present

## 2019-08-27 DIAGNOSIS — I10 Essential (primary) hypertension: Secondary | ICD-10-CM | POA: Diagnosis not present

## 2019-08-27 DIAGNOSIS — E1165 Type 2 diabetes mellitus with hyperglycemia: Secondary | ICD-10-CM | POA: Diagnosis not present

## 2019-08-27 DIAGNOSIS — Z6834 Body mass index (BMI) 34.0-34.9, adult: Secondary | ICD-10-CM | POA: Diagnosis not present

## 2019-08-27 DIAGNOSIS — R5383 Other fatigue: Secondary | ICD-10-CM | POA: Diagnosis not present

## 2019-08-27 DIAGNOSIS — Z1211 Encounter for screening for malignant neoplasm of colon: Secondary | ICD-10-CM | POA: Diagnosis not present

## 2019-08-27 DIAGNOSIS — R05 Cough: Secondary | ICD-10-CM | POA: Diagnosis not present

## 2019-08-27 DIAGNOSIS — E1142 Type 2 diabetes mellitus with diabetic polyneuropathy: Secondary | ICD-10-CM | POA: Diagnosis not present

## 2019-08-27 DIAGNOSIS — Z79899 Other long term (current) drug therapy: Secondary | ICD-10-CM | POA: Diagnosis not present

## 2019-08-27 DIAGNOSIS — Z1331 Encounter for screening for depression: Secondary | ICD-10-CM | POA: Diagnosis not present

## 2019-08-27 DIAGNOSIS — Z299 Encounter for prophylactic measures, unspecified: Secondary | ICD-10-CM | POA: Diagnosis not present

## 2019-08-28 DIAGNOSIS — Z6834 Body mass index (BMI) 34.0-34.9, adult: Secondary | ICD-10-CM | POA: Diagnosis not present

## 2019-08-28 DIAGNOSIS — R05 Cough: Secondary | ICD-10-CM | POA: Diagnosis not present

## 2019-08-28 DIAGNOSIS — E1165 Type 2 diabetes mellitus with hyperglycemia: Secondary | ICD-10-CM | POA: Diagnosis not present

## 2019-08-28 DIAGNOSIS — J189 Pneumonia, unspecified organism: Secondary | ICD-10-CM | POA: Diagnosis not present

## 2019-08-28 DIAGNOSIS — Z299 Encounter for prophylactic measures, unspecified: Secondary | ICD-10-CM | POA: Diagnosis not present

## 2019-08-28 DIAGNOSIS — I700 Atherosclerosis of aorta: Secondary | ICD-10-CM | POA: Diagnosis not present

## 2019-09-08 DIAGNOSIS — I10 Essential (primary) hypertension: Secondary | ICD-10-CM | POA: Diagnosis not present

## 2019-09-11 DIAGNOSIS — J209 Acute bronchitis, unspecified: Secondary | ICD-10-CM | POA: Diagnosis not present

## 2019-09-11 DIAGNOSIS — E1122 Type 2 diabetes mellitus with diabetic chronic kidney disease: Secondary | ICD-10-CM | POA: Diagnosis not present

## 2019-09-11 DIAGNOSIS — N183 Chronic kidney disease, stage 3 unspecified: Secondary | ICD-10-CM | POA: Diagnosis not present

## 2019-09-11 DIAGNOSIS — Z6832 Body mass index (BMI) 32.0-32.9, adult: Secondary | ICD-10-CM | POA: Diagnosis not present

## 2019-09-11 DIAGNOSIS — I10 Essential (primary) hypertension: Secondary | ICD-10-CM | POA: Diagnosis not present

## 2019-09-11 DIAGNOSIS — E1165 Type 2 diabetes mellitus with hyperglycemia: Secondary | ICD-10-CM | POA: Diagnosis not present

## 2019-09-11 DIAGNOSIS — Z299 Encounter for prophylactic measures, unspecified: Secondary | ICD-10-CM | POA: Diagnosis not present

## 2019-09-13 DIAGNOSIS — E119 Type 2 diabetes mellitus without complications: Secondary | ICD-10-CM | POA: Diagnosis not present

## 2019-09-13 DIAGNOSIS — I10 Essential (primary) hypertension: Secondary | ICD-10-CM | POA: Diagnosis not present

## 2019-10-01 ENCOUNTER — Encounter: Payer: Self-pay | Admitting: Internal Medicine

## 2019-10-01 ENCOUNTER — Ambulatory Visit (INDEPENDENT_AMBULATORY_CARE_PROVIDER_SITE_OTHER): Payer: Medicare Other | Admitting: Internal Medicine

## 2019-10-01 ENCOUNTER — Other Ambulatory Visit: Payer: Self-pay

## 2019-10-01 VITALS — BP 150/85 | HR 57 | Temp 98.0°F | Ht 69.0 in | Wt 181.2 lb

## 2019-10-01 DIAGNOSIS — R0602 Shortness of breath: Secondary | ICD-10-CM | POA: Diagnosis not present

## 2019-10-01 DIAGNOSIS — R05 Cough: Secondary | ICD-10-CM | POA: Diagnosis not present

## 2019-10-01 DIAGNOSIS — R058 Other specified cough: Secondary | ICD-10-CM

## 2019-10-01 MED ORDER — DIPHENHYDRAMINE HCL 25 MG PO CAPS: 25.0000 mg | ORAL_CAPSULE | Freq: Every evening | ORAL | 3 refills | Status: DC | PRN

## 2019-10-01 MED ORDER — FLUTICASONE PROPIONATE 50 MCG/ACT NA SUSP: 1.0000 | Freq: Every day | NASAL | 2 refills | Status: DC

## 2019-10-01 NOTE — Patient Instructions (Addendum)
The patient should have follow up scheduled with myself in 2-3 months.   Prior to next visit patient should have: Spirometry/Feno  Flonase - 1 spray on each side of your nose twice a day for first week, then 1 spray on each side.   Instructions for use:  If you also use a saline nasal spray or rinse, use that first.  Position the head with the chin slightly tucked. Use the right hand to spray into the left nostril and the right hand to spray into the left nostril.   Point the bottle away from the septum of your nose (cartilage that divides the two sides of your nose).   Hold the nostril closed on the opposite side from where you will spray  Spray once and gently sniff to pull the medicine into the higher parts of your nose.  Don't sniff too hard as the medicine will drain down the back of your throat instead.  Repeat with a second spray on the same side if prescribed.  Repeat on the other side of your nose.

## 2019-10-01 NOTE — Progress Notes (Signed)
[name]    [id number]    [birth year]  Primary Care Physician:Vyas, Costella Hatcher, MD  Referring Physician: Glenda Chroman, MD [place],  [place] [address] Reason for Consultation: shortness of breath Date of Consultation: 10/01/2019  Chief complaint:   Chief Complaint  Patient presents with  . Consult    cough with clear drainage.  SOB with exertion and speaking.  covid tests negatve x2.  s/s since Nove 2020.  Dry mouth in am.     HPI: Dyspnea on exertion progressive over the last few months. She does not have sob with rest.  Had URI in December and has been coughing since then. Has been coughing for the past month.  She has post nasal drip, sore throat, but no itchy/watery eyes. Cough worse with talking and at night time, but never has woken her up from sleep. No chest pain.  But she has intermittent wheezing.   She was given two different types of inhalers, neither of them helped her breathing. She got thrush with one of them.   Prednisone did nothing for her breathing and cough.   Social history:  Occupation: has worked in [employer] Exposures: lives at home independently, no pets. Used to have border collies.  Smoking history: never smoker, significant passive smoke exposure in childhood  Social History   Occupational History  . Not on file  Tobacco Use  . Smoking status: Never Smoker  . Smokeless tobacco: Never Used  Substance and Sexual Activity  . Alcohol use: No    Alcohol/week: 0.0 standard drinks  . Drug use: No  . Sexual activity: Not on file    Relevant family history: Family History  Problem Relation Age of Onset  . Stroke Mother   . Cancer Father   . Diabetes Brother   . Diabetes Brother   . Diabetes Brother   . Cancer Sister     Past Medical History:  Diagnosis Date  . Cramps, extremity    legs and hands  . Diabetes ([place])    type II  . Enterococcus UTI 02/03/2016  . H/O cardiovascular stress test 03/05/2009   normal, no  ischemia  . H/O Doppler ultrasound    lower ext arterial doppler 04/13/09-no evidence of arterial insufficiency, normal values; distal aorta 2.4x2.7  . HTN (hypertension)   . Murmur, cardiac    echo 02/25/13- EF 60-65%, impaired relaxation-grade 1 diastolic dysfunction, mild concentric lvh, mild to moderate aortic regurgitation  . Pneumonia    May 2017  . Postoperative anemia due to acute blood loss 02/02/2016  . Primary localized osteoarthritis of right knee   . TIA (transient ischemic attack)    carotid doppler 05/08/11-normal patency    Past Surgical History:  Procedure Laterality Date  . ABDOMINAL HYSTERECTOMY    . BUNIONECTOMY Left   . COLONOSCOPY    . TOTAL KNEE ARTHROPLASTY Right 02/01/2016   Procedure: TOTAL KNEE ARTHROPLASTY;  Surgeon: Elsie Saas, MD;  Location: [facility];  Service: Orthopedics;  Laterality: Right;     Review of systems: Review of Systems  Constitutional: Negative for chills, fever and weight loss.  HENT: Positive for congestion and sore throat. Negative for sinus pain.   Eyes: Negative for discharge and redness.  Respiratory: Positive for cough, shortness of breath and wheezing. Negative for hemoptysis and sputum production.   Cardiovascular: Negative for chest pain, palpitations and leg swelling.  Gastrointestinal: Negative for heartburn, nausea and vomiting.  Musculoskeletal:  Positive for joint pain. Negative for myalgias.  Skin: Negative for rash.  Neurological: Negative for dizziness, tremors, focal weakness and headaches.  Endo/Heme/Allergies: Negative for environmental allergies.  Psychiatric/Behavioral: Negative for depression. The patient is not nervous/anxious.   All other systems reviewed and are negative.   Physical Exam: Blood pressure (!) 180/90, pulse (!) 57, temperature 98 F (36.7 C), temperature source Temporal, height 5\' 9"  (1.753 m), weight 181 lb 3.2 oz (82.2 kg), SpO2 97 %. Gen:      No acute distress ENT:  +nasal debris, no nasal  polyps, mucus membranes moist +cobblestoning in oropharynx Lungs:    No increased respiratory effort, symmetric chest wall excursion, clear to auscultation bilaterally, no wheezes or crackles CV:         Regular rate and rhythm; no murmurs, rubs, or gallops.  No pedal edema +bilateral lower extremity varicose veins Neuro: normal speech, no focal facial asymmetry Psych: alert and oriented x3, normal mood and affect   Data Reviewed/Medical Decision Making:  Independent interpretation of tests: Imaging: . Review of patient's chest xray Feb 2021 images revealed right middle lobe "nodule" appears to be more a confluence of rib shadows. Was present in 2017 xray as well. There is some left lower lobe atelectasis which is new compared to 2017. The patient's images have been independently reviewed by me.    PFTs: None on file  Labs:  Lab Results  Component Value Date   WBC 12.7 (H) 02/03/2016   HGB 10.2 (L) 02/03/2016   HCT 30.2 (L) 02/03/2016   MCV 85.8 02/03/2016   PLT 130 (L) 02/03/2016     Immunization status:  Immunization History  Administered Date(s) Administered  . Influenza, High Dose Seasonal PF 04/11/2019    . I reviewed prior external note(s) from Dr. Woody Seller . I reviewed the result(s) of the labs and imaging as noted above.  . I have ordered spirometry, FeNO  Assessment:  Post Viral Cough Syndrome Dyspnea Left Lower Lobe Atelectasis  Plan/Recommendations: Suspect that her cough is already resolving slowly spontaneously. Evidence of drainage in her oropharynx. Will start anti-histamine and intranasal steroids to help her chronic rhinitis  Differential diagnosis for dyspnea includes asthma. Will order spirometry and FeNO.  She is seeing Dr. Woody Seller next week - if LLL atelectasis is persistent on her repeat chest xray at that time would consider non contrast CT Chest to further evaluate.    Return to Care: No follow-ups on file.  Lenice Llamas, MD Pulmonary and  [place]  CC: Glenda Chroman, MD

## 2019-10-08 DIAGNOSIS — I10 Essential (primary) hypertension: Secondary | ICD-10-CM | POA: Diagnosis not present

## 2019-10-09 ENCOUNTER — Ambulatory Visit
Admission: RE | Admit: 2019-10-09 | Discharge: 2019-10-09 | Disposition: A | Discharge status: Home / Self Care | Payer: Self-pay | Source: Ambulatory Visit | Attending: Internal Medicine | Admitting: Internal Medicine

## 2019-10-09 ENCOUNTER — Other Ambulatory Visit: Payer: Self-pay

## 2019-10-09 DIAGNOSIS — R058 Other specified cough: Secondary | ICD-10-CM

## 2019-10-09 DIAGNOSIS — R05 Cough: Secondary | ICD-10-CM

## 2019-10-15 DIAGNOSIS — R05 Cough: Secondary | ICD-10-CM | POA: Diagnosis not present

## 2019-10-15 DIAGNOSIS — I700 Atherosclerosis of aorta: Secondary | ICD-10-CM | POA: Diagnosis not present

## 2019-10-18 DIAGNOSIS — N184 Chronic kidney disease, stage 4 (severe): Secondary | ICD-10-CM | POA: Diagnosis not present

## 2019-10-18 DIAGNOSIS — I10 Essential (primary) hypertension: Secondary | ICD-10-CM | POA: Diagnosis not present

## 2019-10-18 DIAGNOSIS — Z299 Encounter for prophylactic measures, unspecified: Secondary | ICD-10-CM | POA: Diagnosis not present

## 2019-10-18 DIAGNOSIS — R9389 Abnormal findings on diagnostic imaging of other specified body structures: Secondary | ICD-10-CM | POA: Diagnosis not present

## 2019-10-18 DIAGNOSIS — E1165 Type 2 diabetes mellitus with hyperglycemia: Secondary | ICD-10-CM | POA: Diagnosis not present

## 2019-10-18 DIAGNOSIS — E1122 Type 2 diabetes mellitus with diabetic chronic kidney disease: Secondary | ICD-10-CM | POA: Diagnosis not present

## 2019-11-07 DIAGNOSIS — I10 Essential (primary) hypertension: Secondary | ICD-10-CM | POA: Diagnosis not present

## 2019-11-07 DIAGNOSIS — E119 Type 2 diabetes mellitus without complications: Secondary | ICD-10-CM | POA: Diagnosis not present

## 2019-11-08 DIAGNOSIS — I10 Essential (primary) hypertension: Secondary | ICD-10-CM | POA: Diagnosis not present

## 2019-11-14 DIAGNOSIS — I517 Cardiomegaly: Secondary | ICD-10-CM | POA: Diagnosis not present

## 2019-11-14 DIAGNOSIS — K8020 Calculus of gallbladder without cholecystitis without obstruction: Secondary | ICD-10-CM | POA: Diagnosis not present

## 2019-11-14 DIAGNOSIS — R05 Cough: Secondary | ICD-10-CM | POA: Diagnosis not present

## 2019-11-14 DIAGNOSIS — R918 Other nonspecific abnormal finding of lung field: Secondary | ICD-10-CM | POA: Diagnosis not present

## 2019-11-14 DIAGNOSIS — J984 Other disorders of lung: Secondary | ICD-10-CM | POA: Diagnosis not present

## 2019-11-14 DIAGNOSIS — I700 Atherosclerosis of aorta: Secondary | ICD-10-CM | POA: Diagnosis not present

## 2019-12-04 DIAGNOSIS — K5900 Constipation, unspecified: Secondary | ICD-10-CM | POA: Diagnosis not present

## 2019-12-04 DIAGNOSIS — R9389 Abnormal findings on diagnostic imaging of other specified body structures: Secondary | ICD-10-CM | POA: Diagnosis not present

## 2019-12-04 DIAGNOSIS — R5383 Other fatigue: Secondary | ICD-10-CM | POA: Diagnosis not present

## 2019-12-04 DIAGNOSIS — R195 Other fecal abnormalities: Secondary | ICD-10-CM | POA: Diagnosis not present

## 2019-12-04 DIAGNOSIS — R0602 Shortness of breath: Secondary | ICD-10-CM | POA: Diagnosis not present

## 2019-12-08 DIAGNOSIS — E1165 Type 2 diabetes mellitus with hyperglycemia: Secondary | ICD-10-CM | POA: Diagnosis not present

## 2019-12-08 DIAGNOSIS — E119 Type 2 diabetes mellitus without complications: Secondary | ICD-10-CM | POA: Diagnosis not present

## 2019-12-08 DIAGNOSIS — I10 Essential (primary) hypertension: Secondary | ICD-10-CM | POA: Diagnosis not present

## 2019-12-10 ENCOUNTER — Encounter: Payer: Self-pay | Admitting: Internal Medicine

## 2019-12-10 ENCOUNTER — Other Ambulatory Visit: Payer: Self-pay

## 2019-12-10 ENCOUNTER — Ambulatory Visit (INDEPENDENT_AMBULATORY_CARE_PROVIDER_SITE_OTHER): Payer: Medicare Other | Admitting: Internal Medicine

## 2019-12-10 VITALS — BP 130/74 | HR 75 | Temp 97.6°F | Ht 69.0 in | Wt 177.0 lb

## 2019-12-10 DIAGNOSIS — R0602 Shortness of breath: Secondary | ICD-10-CM | POA: Diagnosis not present

## 2019-12-10 DIAGNOSIS — R05 Cough: Secondary | ICD-10-CM

## 2019-12-10 DIAGNOSIS — R053 Chronic cough: Secondary | ICD-10-CM

## 2019-12-10 MED ORDER — FLUTICASONE PROPIONATE 50 MCG/ACT NA SUSP: 1.0000 | Freq: Every day | NASAL | 11 refills | Status: DC

## 2019-12-10 MED ORDER — MONTELUKAST SODIUM 10 MG PO TABS: 10.0000 mg | ORAL_TABLET | Freq: Every day | ORAL | 11 refills | Status: DC

## 2019-12-10 MED ORDER — CETIRIZINE HCL 10 MG PO TABS: 10.0000 mg | ORAL_TABLET | Freq: Every day | ORAL | 11 refills | Status: DC

## 2019-12-10 NOTE — Patient Instructions (Addendum)
The patient should have follow up scheduled with APP after PFTs.   Prior to next visit patient should have: Full set of PFTs  Take these three meds to help your drainage and cough.   Take cetirizine (also known as zyrtec) once a day. This medication can make you a little sleepy so I recommend taking this at bedtime.  Take montelukast (also known as singulair) once a day. This is ok to take in the morning.   Flonase - 1 spray on each side of your nose twice a day for first week, then 1 spray on each side.   Instructions for use:  If you also use a saline nasal spray or rinse, use that first.  Position the head with the chin slightly tucked. Use the right hand to spray into the left nostril and the right hand to spray into the left nostril.   Point the bottle away from the septum of your nose (cartilage that divides the two sides of your nose).   Hold the nostril closed on the opposite side from where you will spray  Spray once and gently sniff to pull the medicine into the higher parts of your nose.  Don't sniff too hard as the medicine will drain down the back of your throat instead.  Repeat with a second spray on the same side if prescribed.  Repeat on the other side of your nose.

## 2019-12-10 NOTE — Progress Notes (Signed)
[name]    [id number]    1934/08/07  [facility], Costella Hatcher, MD Date of Appointment: 12/10/2019 Established Patient Visit  Chief complaint:   Chief Complaint  Patient presents with  . Follow-up    Pt still has complaints of cough and SOB which is same since last visit    HPI: [name] is a 85 y.o. woman with history of URI in Dec 2020 with subsequent post nasal drip and cough.  Interval Updates: Here for follow up today. Symptoms of cough and Was started on anti-histamine and flonase. These are helping, but she doesn't take daily.  Spirometry and FeNO were ordered but not completed. Denies heart burn. No worsening of nocturnal symptoms.  Shortness of breath In the past no improvement with inhalers or prednisone. Had a CT Chest May 6th.   I have reviewed the patient's family social and past medical history and updated as appropriate.   Past Medical History:  Diagnosis Date  . Cramps, extremity    legs and hands  . Diabetes ([place])    type II  . Enterococcus UTI 02/03/2016  . H/O cardiovascular stress test 03/05/2009   normal, no ischemia  . H/O Doppler ultrasound    lower ext arterial doppler 04/13/09-no evidence of arterial insufficiency, normal values; distal aorta 2.4x2.7  . HTN (hypertension)   . Murmur, cardiac    echo 02/25/13- EF 60-65%, impaired relaxation-grade 1 diastolic dysfunction, mild concentric lvh, mild to moderate aortic regurgitation  . Pneumonia    May 2017  . Postoperative anemia due to acute blood loss 02/02/2016  . Primary localized osteoarthritis of right knee   . TIA (transient ischemic attack)    carotid doppler 05/08/11-normal patency    Past Surgical History:  Procedure Laterality Date  . ABDOMINAL HYSTERECTOMY    . BUNIONECTOMY Left   . COLONOSCOPY    . TOTAL KNEE ARTHROPLASTY Right 02/01/2016   Procedure: TOTAL KNEE ARTHROPLASTY;  Surgeon: Elsie Saas, MD;  Location: [facility];  Service: Orthopedics;   Laterality: Right;    Family History  Problem Relation Age of Onset  . Stroke Mother   . Cancer Father   . Lung cancer Father   . Diabetes Brother   . Diabetes Brother   . Diabetes Brother   . Cancer Sister     Social History   Occupational History  . Not on file  Tobacco Use  . Smoking status: Never Smoker  . Smokeless tobacco: Never Used  Substance and Sexual Activity  . Alcohol use: No    Alcohol/week: 0.0 standard drinks  . Drug use: No  . Sexual activity: Not on file    Physical Exam: Blood pressure 130/74, pulse 75, temperature 97.6 F (36.4 C), temperature source Oral, height 5\' 9"  (1.753 m), weight 177 lb (80.3 kg), SpO2 95 %.  Gen:      No acute distress, frequent coughing and throat clearing.  ENT:  no nasal polyps, mucus membranes moist, cobblestoning in oropharynx Lungs:    No increased respiratory effort, symmetric chest wall excursion, clear to auscultation bilaterally, no wheezes or crackles CV:         Regular rate and rhythm; no murmurs, rubs, or gallops.  No pedal edema   Data Reviewed: Imaging: I have personally reviewed the CT Chest from May 6th. Patulous esophagus.  Interval mild patchy densities in the peripheral aspects of both upper lobes with minimally progressive small number of subcentimeter  nodular densities in those areas. This is compatible with an infectious/inflammatory process.  PFTs: None on file.   Labs:  Immunization status: Immunization History  Administered Date(s) Administered  . Influenza, High Dose Seasonal PF 04/11/2019    Assessment:  Chronic Cough Abnormal CT Chest - suspect changes are post-infectious or related to silent aspiration.   Plan/Recommendations: Will obtain PFTs to further evaluate her dyspnea.  Continue aggressive rhinitis management with daily scheduled flonase and cetirizine. Will add singulair.  If not improving would consider treatment of GERD - she has a patulous esophagus and some of her CT  chest changes could be consistent with silent aspiration.   I spent 40 minutes on 12/10/2019 in care of this patient including face to face time and non-face to face time spent charting, review of outside records, and coordination of care.  Return to Care: Return in about 6 weeks (around 01/21/2020). With APP after PFTs. I can see her again after that.   Lenice Llamas, MD Pulmonary and [place]

## 2019-12-26 DIAGNOSIS — R195 Other fecal abnormalities: Secondary | ICD-10-CM | POA: Diagnosis not present

## 2020-01-07 DIAGNOSIS — E1165 Type 2 diabetes mellitus with hyperglycemia: Secondary | ICD-10-CM | POA: Diagnosis not present

## 2020-01-07 DIAGNOSIS — I10 Essential (primary) hypertension: Secondary | ICD-10-CM | POA: Diagnosis not present

## 2020-01-08 DIAGNOSIS — I10 Essential (primary) hypertension: Secondary | ICD-10-CM | POA: Diagnosis not present

## 2020-01-08 DIAGNOSIS — E119 Type 2 diabetes mellitus without complications: Secondary | ICD-10-CM | POA: Diagnosis not present

## 2020-01-13 ENCOUNTER — Other Ambulatory Visit (HOSPITAL_COMMUNITY): Payer: Self-pay | Admitting: *Deleted

## 2020-01-13 DIAGNOSIS — R059 Cough, unspecified: Secondary | ICD-10-CM

## 2020-01-13 DIAGNOSIS — R1310 Dysphagia, unspecified: Secondary | ICD-10-CM

## 2020-01-21 ENCOUNTER — Other Ambulatory Visit (HOSPITAL_COMMUNITY)
Admission: RE | Admit: 2020-01-21 | Discharge: 2020-01-21 | Disposition: A | Discharge status: Home / Self Care | Payer: Medicare Other | Source: Ambulatory Visit | Attending: Internal Medicine | Admitting: Internal Medicine

## 2020-01-21 DIAGNOSIS — Z20822 Contact with and (suspected) exposure to covid-19: Secondary | ICD-10-CM | POA: Insufficient documentation

## 2020-01-21 DIAGNOSIS — Z01812 Encounter for preprocedural laboratory examination: Secondary | ICD-10-CM | POA: Diagnosis not present

## 2020-01-21 LAB — SARS CORONAVIRUS 2 (TAT 6-24 HRS): SARS Coronavirus 2: NEGATIVE

## 2020-01-23 ENCOUNTER — Ambulatory Visit (HOSPITAL_COMMUNITY): Admission: RE | Admit: 2020-01-23 | Payer: Medicare Other | Source: Ambulatory Visit

## 2020-01-23 ENCOUNTER — Encounter (HOSPITAL_COMMUNITY): Payer: Self-pay

## 2020-01-23 ENCOUNTER — Other Ambulatory Visit: Payer: Self-pay

## 2020-01-24 ENCOUNTER — Ambulatory Visit (INDEPENDENT_AMBULATORY_CARE_PROVIDER_SITE_OTHER): Payer: Medicare Other | Admitting: Pulmonary Disease

## 2020-01-24 ENCOUNTER — Other Ambulatory Visit: Payer: Self-pay

## 2020-01-24 ENCOUNTER — Encounter: Payer: Self-pay | Admitting: Pulmonary Disease

## 2020-01-24 VITALS — BP 148/84 | HR 60 | Temp 98.2°F | Wt 178.0 lb

## 2020-01-24 DIAGNOSIS — R0609 Other forms of dyspnea: Secondary | ICD-10-CM | POA: Insufficient documentation

## 2020-01-24 DIAGNOSIS — R05 Cough: Secondary | ICD-10-CM | POA: Diagnosis not present

## 2020-01-24 DIAGNOSIS — R0602 Shortness of breath: Secondary | ICD-10-CM | POA: Diagnosis not present

## 2020-01-24 DIAGNOSIS — R053 Chronic cough: Secondary | ICD-10-CM

## 2020-01-24 MED ORDER — OMEPRAZOLE 20 MG PO CPDR: 20.0000 mg | DELAYED_RELEASE_CAPSULE | Freq: Every day | ORAL | 4 refills | Status: DC

## 2020-01-24 NOTE — Patient Instructions (Addendum)
You were seen today by Lauraine Rinne, NP  for:   1. Chronic cough  - SLP modified barium swallow; Future  Continue Zyrtec  Continue Singulair  Continue Flonase (use after nasal saline)  Start nasal saline rinses twice daily  Use distilled water Shake well Get bottle lukewarm like a baby bottle These can be purchased over-the-counter  Please complete your swallow study as ordered today  2. Shortness of breath  Please schedule a pulmonary function test with our office  Please complete the FeNO at our office with your pulmonary function test  We recommend today:  Orders Placed This Encounter  Procedures  . SLP modified barium swallow    Standing Status:   Future    Standing Expiration Date:   01/23/2021  . Pulmonary function test    WITH FENO    Standing Status:   Future    Standing Expiration Date:   01/23/2021    Order Specific Question:   Where should this test be performed?    Answer:   [facility]    Order Specific Question:   Full PFT: includes the following: basic spirometry, spirometry pre & post bronchodilator, diffusion capacity (DLCO), lung volumes    Answer:   Full PFT   Orders Placed This Encounter  Procedures  . SLP modified barium swallow  . Pulmonary function test   Meds ordered this encounter  Medications  . omeprazole (PRILOSEC) 20 MG capsule    Sig: Take 1 capsule (20 mg total) by mouth daily.    Dispense:  30 capsule    Refill:  4    Follow Up:    Return in about 3 months (around 04/25/2020), or if symptoms worsen or fail to improve, for Follow up with Dr. Shearon Stalls.   Please do your part to reduce the spread of COVID-19:      Reduce your risk of any infection  and COVID19 by using the similar precautions used for avoiding the common cold or flu:  Marland Kitchen Wash your hands often with soap and warm water for at least 20 seconds.  If soap and water are not readily available, use an alcohol-based hand sanitizer with at least 60% alcohol.  . If  coughing or sneezing, cover your mouth and nose by coughing or sneezing into the elbow areas of your shirt or coat, into a tissue or into your sleeve (not your hands). [name] A MASK when in public  . Avoid shaking hands with others and consider head nods or verbal greetings only. . Avoid touching your eyes, nose, or mouth with unwashed hands.  . Avoid close contact with people who are sick. . Avoid places or events with large numbers of people in one location, like concerts or sporting events. . If you have some symptoms but not all symptoms, continue to monitor at home and seek medical attention if your symptoms worsen. . If you are having a medical emergency, call 911.   [place] / e-Visit: [email or web address]         [facility]: [facility]: [phone]                   [facility]: [phone]     It is flu season:   >>> Best ways to protect herself from the flu: Receive the yearly flu vaccine, practice good hand hygiene washing with soap and also using hand sanitizer when available,  eat a nutritious meals, get adequate rest, hydrate appropriately   Please contact the office if your symptoms worsen or you have concerns that you are not improving.   Thank you for choosing [facility] for your healthcare, and for allowing [place] to partner with you on your healthcare journey. I am thankful to be able to provide care to you today.   Wyn Quaker FNP-C

## 2020-01-24 NOTE — Assessment & Plan Note (Signed)
Plan: We will reorder swallow study today Patient to be scheduled for pulmonary function testing Patient be scheduled for FeNO Reviewed why these 3 tests are important with patient Continue Singulair Continue Zyrtec Continue Flonase Start nasal saline rinses We will start omeprazole 20 mg

## 2020-01-24 NOTE — Assessment & Plan Note (Signed)
Benign exam today  Plan: Complete pulmonary function testing as previously scheduled

## 2020-01-24 NOTE — Progress Notes (Addendum)
@Patient  ID: [name], female    DOB: [birth year], 85 y.o.   MRN: [id number]  Chief Complaint  Patient presents with  . Follow-up    cough--feels the drainage in her throat and a dry cough at times.  c/o nasal drainage all the time.     Referring provider: Glenda Chroman, MD  HPI:  84 year old female never smoker followed in our office for chronic cough  PMH: Aortic insufficiency, diabetes, hypertension Smoker/ Smoking History: Never smoker Maintenance: None Pt of: Dr. Shearon Stalls  01/24/2020  - Visit   85 year old female never smoker followed in our office for chronic cough.  She is followed by Dr. Shearon Stalls.  At last office visit in June/2021 it was suspected that she may be having silent aspiration.  Patient was instructed to obtain a swallow study.  She was scheduled for this and missed the appointment yesterday it is noted that the patient no showed.  We will review this today.  Is also recommended the patient get scheduled for pulmonary function testing as well as an FeNO to evaluate any sort of allergic airway component.  Patient has not yet completed these test either.  We will discuss this today.  Patient does feel that the Singulair has helped with management of her cough but it is still persisting.  She is not on a PPI.  She is unsure if she has acid reflux.  We will discuss this today.   Questionaires / Pulmonary Flowsheets:   ACT:  No flowsheet data found.  MMRC: No flowsheet data found.  [facility]worth:  No flowsheet data found.  Tests:   FENO:  No results found for: NITRICOXIDE  PFT: No flowsheet data found.  WALK:  No flowsheet data found.  Imaging: No results found.  Lab Results:  CBC    Component Value Date/Time   WBC 12.7 (H) 02/03/2016 0600   RBC 3.52 (L) 02/03/2016 0600   HGB 10.2 (L) 02/03/2016 0600   HCT 30.2 (L) 02/03/2016 0600   PLT 130 (L) 02/03/2016 0600   MCV 85.8 02/03/2016 0600   MCH 29.0 02/03/2016 0600   MCHC 33.8 02/03/2016  0600   RDW 14.3 02/03/2016 0600   LYMPHSABS 1.4 01/22/2016 1409   MONOABS 0.6 01/22/2016 1409   EOSABS 0.1 01/22/2016 1409   BASOSABS 0.1 01/22/2016 1409    BMET    Component Value Date/Time   NA 141 02/03/2016 0600   K 4.0 02/03/2016 0600   CL 107 02/03/2016 0600   CO2 24 02/03/2016 0600   GLUCOSE 174 (H) 02/03/2016 0600   BUN 35 (H) 02/03/2016 0600   CREATININE 1.19 (H) 02/03/2016 0600   CREATININE 1.25 (H) 03/01/2013 1034   CALCIUM 9.2 02/03/2016 0600   GFRNONAA 43 (L) 02/03/2016 0600   GFRAA 49 (L) 02/03/2016 0600    BNP No results found for: BNP  ProBNP No results found for: PROBNP  Specialty Problems      Pulmonary Problems   Chronic cough   Shortness of breath      Allergies  Allergen Reactions  . Codeine Nausea Only  . Milk-Related Compounds Diarrhea    Immunization History  Administered Date(s) Administered  . Influenza, High Dose Seasonal PF 04/11/2019  . Moderna SARS-COVID-2 Vaccination 10/05/2019, 11/02/2019    Past Medical History:  Diagnosis Date  . Cramps, extremity    legs and hands  . Diabetes ([place])    type II  . Enterococcus UTI 02/03/2016  . H/O cardiovascular stress test 03/05/2009  normal, no ischemia  . H/O Doppler ultrasound    lower ext arterial doppler 04/13/09-no evidence of arterial insufficiency, normal values; distal aorta 2.4x2.7  . HTN (hypertension)   . Murmur, cardiac    echo 02/25/13- EF 60-65%, impaired relaxation-grade 1 diastolic dysfunction, mild concentric lvh, mild to moderate aortic regurgitation  . Pneumonia    May 2017  . Postoperative anemia due to acute blood loss 02/02/2016  . Primary localized osteoarthritis of right knee   . TIA (transient ischemic attack)    carotid doppler 05/08/11-normal patency    Tobacco History: Social History   Tobacco Use  Smoking Status Never Smoker  Smokeless Tobacco Never Used   Counseling given: Not Answered   Continue to not smoke  Outpatient Encounter  Medications as of 01/24/2020  Medication Sig  . cetirizine (ZYRTEC) 10 MG tablet Take 1 tablet (10 mg total) by mouth daily.  . diclofenac (VOLTAREN) 75 MG EC tablet Take 75 mg by mouth 2 (two) times daily.  . fluticasone (FLONASE) 50 MCG/ACT nasal spray Place 1 spray into both nostrils daily.  Marland Kitchen glipiZIDE (GLUCOTROL XL) 5 MG 24 hr tablet Take 5 mg by mouth daily.  . metFORMIN (GLUCOPHAGE) 500 MG tablet Take 500 mg by mouth daily with breakfast.   . metoprolol (LOPRESSOR) 50 MG tablet Take 50 mg by mouth 2 (two) times daily.  . montelukast (SINGULAIR) 10 MG tablet Take 1 tablet (10 mg total) by mouth at bedtime.  . valsartan-hydrochlorothiazide (DIOVAN-HCT) 320-12.5 MG per tablet Take 1 tablet by mouth daily.   Marland Kitchen omeprazole (PRILOSEC) 20 MG capsule Take 1 capsule (20 mg total) by mouth daily.   No facility-administered encounter medications on file as of 01/24/2020.     Review of Systems  Review of Systems  Constitutional: Negative for activity change, fatigue and fever.  HENT: Positive for congestion, postnasal drip and rhinorrhea. Negative for sinus pressure, sinus pain and sore throat.   Respiratory: Positive for cough. Negative for shortness of breath and wheezing.   Cardiovascular: Negative for chest pain and palpitations.  Gastrointestinal: Negative for diarrhea, nausea and vomiting.  Musculoskeletal: Negative for arthralgias.  Neurological: Negative for dizziness.  Psychiatric/Behavioral: Negative for sleep disturbance. The patient is not nervous/anxious.      Physical Exam  BP (!) 148/84   Pulse 60   Temp 98.2 F (36.8 C) (Oral)   Wt 178 lb (80.7 kg)   SpO2 99%   BMI 26.29 kg/m   Wt Readings from Last 5 Encounters:  01/24/20 178 lb (80.7 kg)  12/10/19 177 lb (80.3 kg)  10/01/19 181 lb 3.2 oz (82.2 kg)  12/07/17 184 lb (83.5 kg)  10/25/17 186 lb (84.4 kg)    BMI Readings from Last 5 Encounters:  01/24/20 26.29 kg/m  12/10/19 26.14 kg/m  10/01/19 26.76 kg/m    12/07/17 27.17 kg/m  10/25/17 29.13 kg/m     Physical Exam Vitals and nursing note reviewed.  Constitutional:      General: She is not in acute distress.    Appearance: Normal appearance. She is normal weight.  HENT:     Head: Normocephalic and atraumatic.     Right Ear: Tympanic membrane, ear canal and external ear normal. There is no impacted cerumen.     Left Ear: Tympanic membrane, ear canal and external ear normal. There is no impacted cerumen.     Nose: Rhinorrhea present. No congestion.     Mouth/Throat:     Mouth: Mucous membranes are moist.  Pharynx: Oropharynx is clear.     Comments: Postnasal drip Eyes:     Pupils: Pupils are equal, round, and reactive to light.  Cardiovascular:     Rate and Rhythm: Normal rate and regular rhythm.     Pulses: Normal pulses.     Heart sounds: Normal heart sounds. No murmur heard.   Pulmonary:     Effort: Pulmonary effort is normal. No respiratory distress.     Breath sounds: No decreased air movement. Rales (RLL) present. No decreased breath sounds or wheezing.  Musculoskeletal:     Cervical back: Normal range of motion.  Skin:    General: Skin is warm and dry.     Capillary Refill: Capillary refill takes less than 2 seconds.  Neurological:     General: No focal deficit present.     Mental Status: She is alert and oriented to person, place, and time. Mental status is at baseline.     Gait: Gait normal.  Psychiatric:        Mood and Affect: Mood normal.        Behavior: Behavior normal.        Thought Content: Thought content normal.        Judgment: Judgment normal.       Assessment & Plan:   Chronic cough Plan: We will reorder swallow study today Patient to be scheduled for pulmonary function testing Patient be scheduled for FeNO Reviewed why these 3 tests are important with patient Continue Singulair Continue Zyrtec Continue Flonase Start nasal saline rinses We will start omeprazole 20 mg  Shortness of  breath Benign exam today  Plan: Complete pulmonary function testing as previously scheduled    Return in about 3 months (around 04/25/2020), or if symptoms worsen or fail to improve, for Follow up with Dr. Shearon Stalls, Follow up for FULL PFT - 60 min.   Lauraine Rinne, NP 01/24/2020   This appointment required 34 minutes of patient care (this includes precharting, chart review, review of results, face-to-face care, etc.).

## 2020-01-27 ENCOUNTER — Other Ambulatory Visit (HOSPITAL_COMMUNITY): Payer: Self-pay | Admitting: *Deleted

## 2020-01-27 DIAGNOSIS — R059 Cough, unspecified: Secondary | ICD-10-CM

## 2020-01-27 DIAGNOSIS — R1310 Dysphagia, unspecified: Secondary | ICD-10-CM

## 2020-02-04 ENCOUNTER — Other Ambulatory Visit: Payer: Self-pay

## 2020-02-04 ENCOUNTER — Ambulatory Visit (HOSPITAL_COMMUNITY)
Admission: RE | Admit: 2020-02-04 | Discharge: 2020-02-04 | Disposition: A | Discharge status: Home / Self Care | Payer: Medicare Other | Source: Ambulatory Visit | Attending: Pulmonary Disease | Admitting: Pulmonary Disease

## 2020-02-04 DIAGNOSIS — R1310 Dysphagia, unspecified: Secondary | ICD-10-CM | POA: Insufficient documentation

## 2020-02-04 DIAGNOSIS — R059 Cough, unspecified: Secondary | ICD-10-CM

## 2020-02-04 DIAGNOSIS — R05 Cough: Secondary | ICD-10-CM | POA: Insufficient documentation

## 2020-02-04 DIAGNOSIS — R0602 Shortness of breath: Secondary | ICD-10-CM | POA: Insufficient documentation

## 2020-02-04 DIAGNOSIS — R911 Solitary pulmonary nodule: Secondary | ICD-10-CM | POA: Insufficient documentation

## 2020-02-04 DIAGNOSIS — R053 Chronic cough: Secondary | ICD-10-CM

## 2020-02-04 DIAGNOSIS — J9811 Atelectasis: Secondary | ICD-10-CM | POA: Insufficient documentation

## 2020-02-04 NOTE — Therapy (Signed)
Modified Barium Swallow Progress Note  Patient Details  Name: [name] MRN: [id number] Date of Birth: 1935-03-06  Today's Date: 02/04/2020  Modified Barium Swallow completed.  Full report located under Chart Review in the Imaging Section.  Brief recommendations include the following:  Clinical Impression    Ms. Rau demonstrates oropharyngeal swallow function within normal limits with no penetration or aspiration visualized despite challenging trials of consecutive straw sips of thin liquids. Swallow function was exceptional for her age with no residue observed. Esophageal sweep revealed tablet sticking at GE junction briefly, but cleared given 1-2 minutes time. Patient's concerns for chronic throat clearing and coughing do not appear to be related to penetration or aspiration. Would recommend ENT consult. Of note, pt may be a good candidate for the Voice Clinic through [facility] to see a specialist in chronic cough (SLP).   No restrictions indicated. Pt may continue regular solids and thin liquids with medications as tolerated.   Swallow Evaluation Recommendations   Recommended Consults: Consider ENT evaluation   SLP Diet Recommendations: Regular solids;Thin liquid   Liquid Administration via: Cup;Straw   Medication Administration: Whole meds with liquid                        [place] 02/04/2020,1:00 PM

## 2020-02-05 ENCOUNTER — Other Ambulatory Visit: Payer: Self-pay | Admitting: Pulmonary Disease

## 2020-02-05 ENCOUNTER — Telehealth: Payer: Self-pay | Admitting: Pulmonary Disease

## 2020-02-05 DIAGNOSIS — R053 Chronic cough: Secondary | ICD-10-CM

## 2020-02-05 NOTE — Telephone Encounter (Signed)
Lauraine Rinne, NP  02/04/2020 1:25 PM EDT     Swallow functioning is exceptional based off of recent swallow study they are recommending a ear nose and throat evaluation. Specifically recommending a referral to voice clinic through [facility]. If the patient is agreeable to this we can go ahead and place the referral.  Wyn Quaker, FNP   Pt is aware of results and voiced her understanding.  Pt is agreeable with referral to voice clinic.  Referral has been placed.  Nothing further is needed.

## 2020-02-06 DIAGNOSIS — Z6832 Body mass index (BMI) 32.0-32.9, adult: Secondary | ICD-10-CM | POA: Diagnosis not present

## 2020-02-06 DIAGNOSIS — K2970 Gastritis, unspecified, without bleeding: Secondary | ICD-10-CM | POA: Diagnosis not present

## 2020-02-06 DIAGNOSIS — Z299 Encounter for prophylactic measures, unspecified: Secondary | ICD-10-CM | POA: Diagnosis not present

## 2020-02-06 DIAGNOSIS — E1142 Type 2 diabetes mellitus with diabetic polyneuropathy: Secondary | ICD-10-CM | POA: Diagnosis not present

## 2020-02-06 DIAGNOSIS — K921 Melena: Secondary | ICD-10-CM | POA: Diagnosis not present

## 2020-02-06 DIAGNOSIS — I10 Essential (primary) hypertension: Secondary | ICD-10-CM | POA: Diagnosis not present

## 2020-02-06 DIAGNOSIS — E1165 Type 2 diabetes mellitus with hyperglycemia: Secondary | ICD-10-CM | POA: Diagnosis not present

## 2020-02-07 DIAGNOSIS — E119 Type 2 diabetes mellitus without complications: Secondary | ICD-10-CM | POA: Diagnosis not present

## 2020-02-07 DIAGNOSIS — E1165 Type 2 diabetes mellitus with hyperglycemia: Secondary | ICD-10-CM | POA: Diagnosis not present

## 2020-02-07 DIAGNOSIS — I10 Essential (primary) hypertension: Secondary | ICD-10-CM | POA: Diagnosis not present

## 2020-02-10 ENCOUNTER — Telehealth: Payer: Self-pay | Admitting: Pulmonary Disease

## 2020-02-10 DIAGNOSIS — K5901 Slow transit constipation: Secondary | ICD-10-CM | POA: Diagnosis not present

## 2020-02-10 DIAGNOSIS — R1312 Dysphagia, oropharyngeal phase: Secondary | ICD-10-CM | POA: Diagnosis not present

## 2020-02-10 DIAGNOSIS — K625 Hemorrhage of anus and rectum: Secondary | ICD-10-CM | POA: Diagnosis not present

## 2020-02-10 DIAGNOSIS — R197 Diarrhea, unspecified: Secondary | ICD-10-CM | POA: Diagnosis not present

## 2020-02-10 DIAGNOSIS — K219 Gastro-esophageal reflux disease without esophagitis: Secondary | ICD-10-CM | POA: Diagnosis not present

## 2020-02-10 NOTE — Telephone Encounter (Signed)
Called and spoke with Juliann Pulse letting her know that patient was switched from Pantoprazole to Omeprazole. Diclofenac is still on patient's med list and does not say in last OV that patient is to stop that medication. She expressed understanding. Nothing further needed at this time.

## 2020-02-12 DIAGNOSIS — M25562 Pain in left knee: Secondary | ICD-10-CM | POA: Diagnosis not present

## 2020-02-12 DIAGNOSIS — E119 Type 2 diabetes mellitus without complications: Secondary | ICD-10-CM | POA: Diagnosis not present

## 2020-02-12 DIAGNOSIS — I10 Essential (primary) hypertension: Secondary | ICD-10-CM | POA: Diagnosis not present

## 2020-02-19 DIAGNOSIS — M25562 Pain in left knee: Secondary | ICD-10-CM | POA: Diagnosis not present

## 2020-02-19 DIAGNOSIS — M25561 Pain in right knee: Secondary | ICD-10-CM | POA: Diagnosis not present

## 2020-02-25 DIAGNOSIS — R1310 Dysphagia, unspecified: Secondary | ICD-10-CM | POA: Diagnosis not present

## 2020-02-25 DIAGNOSIS — J383 Other diseases of vocal cords: Secondary | ICD-10-CM | POA: Diagnosis not present

## 2020-02-25 DIAGNOSIS — J3801 Paralysis of vocal cords and larynx, unilateral: Secondary | ICD-10-CM | POA: Insufficient documentation

## 2020-02-25 DIAGNOSIS — R490 Dysphonia: Secondary | ICD-10-CM | POA: Diagnosis not present

## 2020-02-25 DIAGNOSIS — Z888 Allergy status to other drugs, medicaments and biological substances status: Secondary | ICD-10-CM | POA: Diagnosis not present

## 2020-02-25 DIAGNOSIS — R05 Cough: Secondary | ICD-10-CM | POA: Diagnosis not present

## 2020-02-25 DIAGNOSIS — Z885 Allergy status to narcotic agent status: Secondary | ICD-10-CM | POA: Diagnosis not present

## 2020-02-25 DIAGNOSIS — K219 Gastro-esophageal reflux disease without esophagitis: Secondary | ICD-10-CM | POA: Diagnosis not present

## 2020-02-29 DIAGNOSIS — M7552 Bursitis of left shoulder: Secondary | ICD-10-CM | POA: Diagnosis not present

## 2020-03-04 DIAGNOSIS — K5901 Slow transit constipation: Secondary | ICD-10-CM | POA: Diagnosis not present

## 2020-03-04 DIAGNOSIS — K219 Gastro-esophageal reflux disease without esophagitis: Secondary | ICD-10-CM | POA: Diagnosis not present

## 2020-03-04 DIAGNOSIS — R197 Diarrhea, unspecified: Secondary | ICD-10-CM | POA: Diagnosis not present

## 2020-03-04 DIAGNOSIS — R1312 Dysphagia, oropharyngeal phase: Secondary | ICD-10-CM | POA: Diagnosis not present

## 2020-03-10 DIAGNOSIS — I10 Essential (primary) hypertension: Secondary | ICD-10-CM | POA: Diagnosis not present

## 2020-03-10 DIAGNOSIS — E1165 Type 2 diabetes mellitus with hyperglycemia: Secondary | ICD-10-CM | POA: Diagnosis not present

## 2020-03-11 DIAGNOSIS — E1165 Type 2 diabetes mellitus with hyperglycemia: Secondary | ICD-10-CM | POA: Diagnosis not present

## 2020-03-11 DIAGNOSIS — E1122 Type 2 diabetes mellitus with diabetic chronic kidney disease: Secondary | ICD-10-CM | POA: Diagnosis not present

## 2020-03-11 DIAGNOSIS — E1142 Type 2 diabetes mellitus with diabetic polyneuropathy: Secondary | ICD-10-CM | POA: Diagnosis not present

## 2020-03-11 DIAGNOSIS — I10 Essential (primary) hypertension: Secondary | ICD-10-CM | POA: Diagnosis not present

## 2020-03-11 DIAGNOSIS — Z299 Encounter for prophylactic measures, unspecified: Secondary | ICD-10-CM | POA: Diagnosis not present

## 2020-03-11 DIAGNOSIS — I700 Atherosclerosis of aorta: Secondary | ICD-10-CM | POA: Diagnosis not present

## 2020-03-18 DIAGNOSIS — Z299 Encounter for prophylactic measures, unspecified: Secondary | ICD-10-CM | POA: Diagnosis not present

## 2020-03-18 DIAGNOSIS — R5383 Other fatigue: Secondary | ICD-10-CM | POA: Diagnosis not present

## 2020-03-18 DIAGNOSIS — M19041 Primary osteoarthritis, right hand: Secondary | ICD-10-CM | POA: Diagnosis not present

## 2020-03-18 DIAGNOSIS — D692 Other nonthrombocytopenic purpura: Secondary | ICD-10-CM | POA: Diagnosis not present

## 2020-03-18 DIAGNOSIS — Z79899 Other long term (current) drug therapy: Secondary | ICD-10-CM | POA: Diagnosis not present

## 2020-03-18 DIAGNOSIS — N183 Chronic kidney disease, stage 3 unspecified: Secondary | ICD-10-CM | POA: Diagnosis not present

## 2020-03-18 DIAGNOSIS — I10 Essential (primary) hypertension: Secondary | ICD-10-CM | POA: Diagnosis not present

## 2020-04-02 DIAGNOSIS — M10071 Idiopathic gout, right ankle and foot: Secondary | ICD-10-CM | POA: Diagnosis not present

## 2020-04-02 DIAGNOSIS — M79671 Pain in right foot: Secondary | ICD-10-CM | POA: Diagnosis not present

## 2020-04-09 DIAGNOSIS — E1165 Type 2 diabetes mellitus with hyperglycemia: Secondary | ICD-10-CM | POA: Diagnosis not present

## 2020-04-09 DIAGNOSIS — I10 Essential (primary) hypertension: Secondary | ICD-10-CM | POA: Diagnosis not present

## 2020-04-09 DIAGNOSIS — E119 Type 2 diabetes mellitus without complications: Secondary | ICD-10-CM | POA: Diagnosis not present

## 2020-04-13 DIAGNOSIS — R197 Diarrhea, unspecified: Secondary | ICD-10-CM | POA: Diagnosis not present

## 2020-04-13 DIAGNOSIS — R1312 Dysphagia, oropharyngeal phase: Secondary | ICD-10-CM | POA: Diagnosis not present

## 2020-04-13 DIAGNOSIS — K219 Gastro-esophageal reflux disease without esophagitis: Secondary | ICD-10-CM | POA: Diagnosis not present

## 2020-04-13 DIAGNOSIS — K5901 Slow transit constipation: Secondary | ICD-10-CM | POA: Diagnosis not present

## 2020-04-14 DIAGNOSIS — Z299 Encounter for prophylactic measures, unspecified: Secondary | ICD-10-CM | POA: Diagnosis not present

## 2020-04-14 DIAGNOSIS — M48061 Spinal stenosis, lumbar region without neurogenic claudication: Secondary | ICD-10-CM | POA: Diagnosis not present

## 2020-04-14 DIAGNOSIS — I10 Essential (primary) hypertension: Secondary | ICD-10-CM | POA: Diagnosis not present

## 2020-04-28 DIAGNOSIS — Z23 Encounter for immunization: Secondary | ICD-10-CM | POA: Diagnosis not present

## 2020-05-08 DIAGNOSIS — E119 Type 2 diabetes mellitus without complications: Secondary | ICD-10-CM | POA: Diagnosis not present

## 2020-05-08 DIAGNOSIS — I10 Essential (primary) hypertension: Secondary | ICD-10-CM | POA: Diagnosis not present

## 2020-05-09 DIAGNOSIS — E1165 Type 2 diabetes mellitus with hyperglycemia: Secondary | ICD-10-CM | POA: Diagnosis not present

## 2020-05-09 DIAGNOSIS — I10 Essential (primary) hypertension: Secondary | ICD-10-CM | POA: Diagnosis not present

## 2020-05-26 DIAGNOSIS — Z23 Encounter for immunization: Secondary | ICD-10-CM | POA: Diagnosis not present

## 2020-06-09 DIAGNOSIS — E119 Type 2 diabetes mellitus without complications: Secondary | ICD-10-CM | POA: Diagnosis not present

## 2020-06-09 DIAGNOSIS — I10 Essential (primary) hypertension: Secondary | ICD-10-CM | POA: Diagnosis not present

## 2020-06-09 DIAGNOSIS — E1165 Type 2 diabetes mellitus with hyperglycemia: Secondary | ICD-10-CM | POA: Diagnosis not present

## 2020-06-30 DIAGNOSIS — Z299 Encounter for prophylactic measures, unspecified: Secondary | ICD-10-CM | POA: Diagnosis not present

## 2020-06-30 DIAGNOSIS — E119 Type 2 diabetes mellitus without complications: Secondary | ICD-10-CM | POA: Diagnosis not present

## 2020-06-30 DIAGNOSIS — I10 Essential (primary) hypertension: Secondary | ICD-10-CM | POA: Diagnosis not present

## 2020-06-30 DIAGNOSIS — E1165 Type 2 diabetes mellitus with hyperglycemia: Secondary | ICD-10-CM | POA: Diagnosis not present

## 2020-06-30 DIAGNOSIS — N183 Chronic kidney disease, stage 3 unspecified: Secondary | ICD-10-CM | POA: Diagnosis not present

## 2020-06-30 DIAGNOSIS — E1122 Type 2 diabetes mellitus with diabetic chronic kidney disease: Secondary | ICD-10-CM | POA: Diagnosis not present

## 2020-06-30 DIAGNOSIS — I700 Atherosclerosis of aorta: Secondary | ICD-10-CM | POA: Diagnosis not present

## 2020-07-09 DIAGNOSIS — E1165 Type 2 diabetes mellitus with hyperglycemia: Secondary | ICD-10-CM | POA: Diagnosis not present

## 2020-07-09 DIAGNOSIS — E119 Type 2 diabetes mellitus without complications: Secondary | ICD-10-CM | POA: Diagnosis not present

## 2020-07-09 DIAGNOSIS — I10 Essential (primary) hypertension: Secondary | ICD-10-CM | POA: Diagnosis not present

## 2020-08-07 DIAGNOSIS — Z87891 Personal history of nicotine dependence: Secondary | ICD-10-CM | POA: Diagnosis not present

## 2020-08-07 DIAGNOSIS — M25532 Pain in left wrist: Secondary | ICD-10-CM | POA: Diagnosis not present

## 2020-08-07 DIAGNOSIS — I700 Atherosclerosis of aorta: Secondary | ICD-10-CM | POA: Diagnosis not present

## 2020-08-07 DIAGNOSIS — N183 Chronic kidney disease, stage 3 unspecified: Secondary | ICD-10-CM | POA: Diagnosis not present

## 2020-08-07 DIAGNOSIS — Z299 Encounter for prophylactic measures, unspecified: Secondary | ICD-10-CM | POA: Diagnosis not present

## 2020-08-07 DIAGNOSIS — I10 Essential (primary) hypertension: Secondary | ICD-10-CM | POA: Diagnosis not present

## 2020-08-10 DIAGNOSIS — F32A Depression, unspecified: Secondary | ICD-10-CM | POA: Diagnosis not present

## 2020-08-10 DIAGNOSIS — I10 Essential (primary) hypertension: Secondary | ICD-10-CM | POA: Diagnosis not present

## 2020-08-10 DIAGNOSIS — R42 Dizziness and giddiness: Secondary | ICD-10-CM | POA: Diagnosis not present

## 2020-08-10 DIAGNOSIS — G4700 Insomnia, unspecified: Secondary | ICD-10-CM | POA: Diagnosis not present

## 2020-08-10 DIAGNOSIS — Z299 Encounter for prophylactic measures, unspecified: Secondary | ICD-10-CM | POA: Diagnosis not present

## 2020-08-10 DIAGNOSIS — E1165 Type 2 diabetes mellitus with hyperglycemia: Secondary | ICD-10-CM | POA: Diagnosis not present

## 2020-08-13 DIAGNOSIS — E1122 Type 2 diabetes mellitus with diabetic chronic kidney disease: Secondary | ICD-10-CM | POA: Diagnosis not present

## 2020-08-13 DIAGNOSIS — Z299 Encounter for prophylactic measures, unspecified: Secondary | ICD-10-CM | POA: Diagnosis not present

## 2020-08-13 DIAGNOSIS — M1990 Unspecified osteoarthritis, unspecified site: Secondary | ICD-10-CM | POA: Diagnosis not present

## 2020-08-13 DIAGNOSIS — E1165 Type 2 diabetes mellitus with hyperglycemia: Secondary | ICD-10-CM | POA: Diagnosis not present

## 2020-08-13 DIAGNOSIS — N183 Chronic kidney disease, stage 3 unspecified: Secondary | ICD-10-CM | POA: Diagnosis not present

## 2020-08-13 DIAGNOSIS — I10 Essential (primary) hypertension: Secondary | ICD-10-CM | POA: Diagnosis not present

## 2020-08-24 DIAGNOSIS — M1710 Unilateral primary osteoarthritis, unspecified knee: Secondary | ICD-10-CM | POA: Diagnosis not present

## 2020-08-24 DIAGNOSIS — I10 Essential (primary) hypertension: Secondary | ICD-10-CM | POA: Diagnosis not present

## 2020-08-24 DIAGNOSIS — M25551 Pain in right hip: Secondary | ICD-10-CM | POA: Diagnosis not present

## 2020-08-24 DIAGNOSIS — M25561 Pain in right knee: Secondary | ICD-10-CM | POA: Diagnosis not present

## 2020-08-24 DIAGNOSIS — Z299 Encounter for prophylactic measures, unspecified: Secondary | ICD-10-CM | POA: Diagnosis not present

## 2020-08-24 DIAGNOSIS — M19049 Primary osteoarthritis, unspecified hand: Secondary | ICD-10-CM | POA: Diagnosis not present

## 2020-08-31 DIAGNOSIS — Z1339 Encounter for screening examination for other mental health and behavioral disorders: Secondary | ICD-10-CM | POA: Diagnosis not present

## 2020-08-31 DIAGNOSIS — E669 Obesity, unspecified: Secondary | ICD-10-CM | POA: Diagnosis not present

## 2020-08-31 DIAGNOSIS — R5383 Other fatigue: Secondary | ICD-10-CM | POA: Diagnosis not present

## 2020-08-31 DIAGNOSIS — Z1331 Encounter for screening for depression: Secondary | ICD-10-CM | POA: Diagnosis not present

## 2020-08-31 DIAGNOSIS — I10 Essential (primary) hypertension: Secondary | ICD-10-CM | POA: Diagnosis not present

## 2020-08-31 DIAGNOSIS — Z6835 Body mass index (BMI) 35.0-35.9, adult: Secondary | ICD-10-CM | POA: Diagnosis not present

## 2020-08-31 DIAGNOSIS — Z79899 Other long term (current) drug therapy: Secondary | ICD-10-CM | POA: Diagnosis not present

## 2020-08-31 DIAGNOSIS — M25562 Pain in left knee: Secondary | ICD-10-CM | POA: Diagnosis not present

## 2020-08-31 DIAGNOSIS — M25511 Pain in right shoulder: Secondary | ICD-10-CM | POA: Diagnosis not present

## 2020-08-31 DIAGNOSIS — Z299 Encounter for prophylactic measures, unspecified: Secondary | ICD-10-CM | POA: Diagnosis not present

## 2020-08-31 DIAGNOSIS — Z7189 Other specified counseling: Secondary | ICD-10-CM | POA: Diagnosis not present

## 2020-08-31 DIAGNOSIS — E7800 Pure hypercholesterolemia, unspecified: Secondary | ICD-10-CM | POA: Diagnosis not present

## 2020-08-31 DIAGNOSIS — N184 Chronic kidney disease, stage 4 (severe): Secondary | ICD-10-CM | POA: Diagnosis not present

## 2020-08-31 DIAGNOSIS — Z0000 Encounter for general adult medical examination without abnormal findings: Secondary | ICD-10-CM | POA: Diagnosis not present

## 2020-09-04 DIAGNOSIS — M25562 Pain in left knee: Secondary | ICD-10-CM | POA: Diagnosis not present

## 2020-09-04 DIAGNOSIS — M25511 Pain in right shoulder: Secondary | ICD-10-CM | POA: Diagnosis not present

## 2020-09-07 DIAGNOSIS — I10 Essential (primary) hypertension: Secondary | ICD-10-CM | POA: Diagnosis not present

## 2020-09-07 DIAGNOSIS — E1165 Type 2 diabetes mellitus with hyperglycemia: Secondary | ICD-10-CM | POA: Diagnosis not present

## 2020-09-08 DIAGNOSIS — M25511 Pain in right shoulder: Secondary | ICD-10-CM | POA: Diagnosis not present

## 2020-09-08 DIAGNOSIS — M25562 Pain in left knee: Secondary | ICD-10-CM | POA: Diagnosis not present

## 2020-09-11 DIAGNOSIS — M25511 Pain in right shoulder: Secondary | ICD-10-CM | POA: Diagnosis not present

## 2020-09-11 DIAGNOSIS — M25562 Pain in left knee: Secondary | ICD-10-CM | POA: Diagnosis not present

## 2020-09-14 DIAGNOSIS — M25511 Pain in right shoulder: Secondary | ICD-10-CM | POA: Diagnosis not present

## 2020-09-14 DIAGNOSIS — M25562 Pain in left knee: Secondary | ICD-10-CM | POA: Diagnosis not present

## 2020-09-17 DIAGNOSIS — M25511 Pain in right shoulder: Secondary | ICD-10-CM | POA: Diagnosis not present

## 2020-09-17 DIAGNOSIS — M25562 Pain in left knee: Secondary | ICD-10-CM | POA: Diagnosis not present

## 2020-09-23 DIAGNOSIS — M25511 Pain in right shoulder: Secondary | ICD-10-CM | POA: Diagnosis not present

## 2020-09-23 DIAGNOSIS — M25562 Pain in left knee: Secondary | ICD-10-CM | POA: Diagnosis not present

## 2020-09-24 DIAGNOSIS — M25562 Pain in left knee: Secondary | ICD-10-CM | POA: Diagnosis not present

## 2020-09-24 DIAGNOSIS — M25511 Pain in right shoulder: Secondary | ICD-10-CM | POA: Diagnosis not present

## 2020-09-28 DIAGNOSIS — M25562 Pain in left knee: Secondary | ICD-10-CM | POA: Diagnosis not present

## 2020-09-28 DIAGNOSIS — M25511 Pain in right shoulder: Secondary | ICD-10-CM | POA: Diagnosis not present

## 2020-09-30 DIAGNOSIS — M25562 Pain in left knee: Secondary | ICD-10-CM | POA: Diagnosis not present

## 2020-09-30 DIAGNOSIS — M25511 Pain in right shoulder: Secondary | ICD-10-CM | POA: Diagnosis not present

## 2020-10-06 DIAGNOSIS — I10 Essential (primary) hypertension: Secondary | ICD-10-CM | POA: Diagnosis not present

## 2020-10-06 DIAGNOSIS — E1165 Type 2 diabetes mellitus with hyperglycemia: Secondary | ICD-10-CM | POA: Diagnosis not present

## 2020-10-06 DIAGNOSIS — M25562 Pain in left knee: Secondary | ICD-10-CM | POA: Diagnosis not present

## 2020-10-06 DIAGNOSIS — E1122 Type 2 diabetes mellitus with diabetic chronic kidney disease: Secondary | ICD-10-CM | POA: Diagnosis not present

## 2020-10-06 DIAGNOSIS — Z299 Encounter for prophylactic measures, unspecified: Secondary | ICD-10-CM | POA: Diagnosis not present

## 2020-10-06 DIAGNOSIS — N184 Chronic kidney disease, stage 4 (severe): Secondary | ICD-10-CM | POA: Diagnosis not present

## 2020-10-06 DIAGNOSIS — M25511 Pain in right shoulder: Secondary | ICD-10-CM | POA: Diagnosis not present

## 2020-10-08 DIAGNOSIS — I10 Essential (primary) hypertension: Secondary | ICD-10-CM | POA: Diagnosis not present

## 2020-10-08 DIAGNOSIS — E1165 Type 2 diabetes mellitus with hyperglycemia: Secondary | ICD-10-CM | POA: Diagnosis not present

## 2020-10-08 DIAGNOSIS — M25562 Pain in left knee: Secondary | ICD-10-CM | POA: Diagnosis not present

## 2020-10-08 DIAGNOSIS — M25511 Pain in right shoulder: Secondary | ICD-10-CM | POA: Diagnosis not present

## 2020-10-12 DIAGNOSIS — M25562 Pain in left knee: Secondary | ICD-10-CM | POA: Diagnosis not present

## 2020-10-12 DIAGNOSIS — M25511 Pain in right shoulder: Secondary | ICD-10-CM | POA: Diagnosis not present

## 2020-10-14 DIAGNOSIS — M25511 Pain in right shoulder: Secondary | ICD-10-CM | POA: Diagnosis not present

## 2020-10-14 DIAGNOSIS — M25562 Pain in left knee: Secondary | ICD-10-CM | POA: Diagnosis not present

## 2020-10-19 DIAGNOSIS — M25511 Pain in right shoulder: Secondary | ICD-10-CM | POA: Diagnosis not present

## 2020-10-19 DIAGNOSIS — M25562 Pain in left knee: Secondary | ICD-10-CM | POA: Diagnosis not present

## 2020-10-21 DIAGNOSIS — M25511 Pain in right shoulder: Secondary | ICD-10-CM | POA: Diagnosis not present

## 2020-10-21 DIAGNOSIS — M25562 Pain in left knee: Secondary | ICD-10-CM | POA: Diagnosis not present

## 2020-10-26 DIAGNOSIS — M25511 Pain in right shoulder: Secondary | ICD-10-CM | POA: Diagnosis not present

## 2020-10-26 DIAGNOSIS — M25562 Pain in left knee: Secondary | ICD-10-CM | POA: Diagnosis not present

## 2020-10-27 DIAGNOSIS — M47896 Other spondylosis, lumbar region: Secondary | ICD-10-CM | POA: Diagnosis not present

## 2020-10-27 DIAGNOSIS — M170 Bilateral primary osteoarthritis of knee: Secondary | ICD-10-CM | POA: Diagnosis not present

## 2020-10-27 DIAGNOSIS — M25552 Pain in left hip: Secondary | ICD-10-CM | POA: Diagnosis not present

## 2020-10-28 DIAGNOSIS — M25511 Pain in right shoulder: Secondary | ICD-10-CM | POA: Diagnosis not present

## 2020-10-28 DIAGNOSIS — M25562 Pain in left knee: Secondary | ICD-10-CM | POA: Diagnosis not present

## 2020-11-02 DIAGNOSIS — M25511 Pain in right shoulder: Secondary | ICD-10-CM | POA: Diagnosis not present

## 2020-11-02 DIAGNOSIS — M25562 Pain in left knee: Secondary | ICD-10-CM | POA: Diagnosis not present

## 2020-11-03 DIAGNOSIS — Z23 Encounter for immunization: Secondary | ICD-10-CM | POA: Diagnosis not present

## 2020-11-04 DIAGNOSIS — M25562 Pain in left knee: Secondary | ICD-10-CM | POA: Diagnosis not present

## 2020-11-04 DIAGNOSIS — M25511 Pain in right shoulder: Secondary | ICD-10-CM | POA: Diagnosis not present

## 2020-11-06 DIAGNOSIS — E1165 Type 2 diabetes mellitus with hyperglycemia: Secondary | ICD-10-CM | POA: Diagnosis not present

## 2020-11-06 DIAGNOSIS — I10 Essential (primary) hypertension: Secondary | ICD-10-CM | POA: Diagnosis not present

## 2020-11-07 DIAGNOSIS — I10 Essential (primary) hypertension: Secondary | ICD-10-CM | POA: Diagnosis not present

## 2020-11-07 DIAGNOSIS — M1710 Unilateral primary osteoarthritis, unspecified knee: Secondary | ICD-10-CM | POA: Diagnosis not present

## 2020-11-07 DIAGNOSIS — E7800 Pure hypercholesterolemia, unspecified: Secondary | ICD-10-CM | POA: Diagnosis not present

## 2020-11-07 DIAGNOSIS — E1165 Type 2 diabetes mellitus with hyperglycemia: Secondary | ICD-10-CM | POA: Diagnosis not present

## 2020-11-09 DIAGNOSIS — M25562 Pain in left knee: Secondary | ICD-10-CM | POA: Diagnosis not present

## 2020-11-09 DIAGNOSIS — M25511 Pain in right shoulder: Secondary | ICD-10-CM | POA: Diagnosis not present

## 2020-11-16 DIAGNOSIS — M25511 Pain in right shoulder: Secondary | ICD-10-CM | POA: Diagnosis not present

## 2020-11-16 DIAGNOSIS — M25562 Pain in left knee: Secondary | ICD-10-CM | POA: Diagnosis not present

## 2020-11-18 DIAGNOSIS — M25562 Pain in left knee: Secondary | ICD-10-CM | POA: Diagnosis not present

## 2020-11-18 DIAGNOSIS — M25511 Pain in right shoulder: Secondary | ICD-10-CM | POA: Diagnosis not present

## 2020-11-23 DIAGNOSIS — M25511 Pain in right shoulder: Secondary | ICD-10-CM | POA: Diagnosis not present

## 2020-11-23 DIAGNOSIS — M25562 Pain in left knee: Secondary | ICD-10-CM | POA: Diagnosis not present

## 2020-11-25 DIAGNOSIS — M25562 Pain in left knee: Secondary | ICD-10-CM | POA: Diagnosis not present

## 2020-11-25 DIAGNOSIS — M25511 Pain in right shoulder: Secondary | ICD-10-CM | POA: Diagnosis not present

## 2020-11-27 DIAGNOSIS — K219 Gastro-esophageal reflux disease without esophagitis: Secondary | ICD-10-CM | POA: Diagnosis not present

## 2020-11-30 DIAGNOSIS — M25562 Pain in left knee: Secondary | ICD-10-CM | POA: Diagnosis not present

## 2020-11-30 DIAGNOSIS — M25511 Pain in right shoulder: Secondary | ICD-10-CM | POA: Diagnosis not present

## 2020-12-01 DIAGNOSIS — E1140 Type 2 diabetes mellitus with diabetic neuropathy, unspecified: Secondary | ICD-10-CM | POA: Diagnosis not present

## 2020-12-01 DIAGNOSIS — E1151 Type 2 diabetes mellitus with diabetic peripheral angiopathy without gangrene: Secondary | ICD-10-CM | POA: Diagnosis not present

## 2020-12-03 DIAGNOSIS — M25562 Pain in left knee: Secondary | ICD-10-CM | POA: Diagnosis not present

## 2020-12-03 DIAGNOSIS — M25511 Pain in right shoulder: Secondary | ICD-10-CM | POA: Diagnosis not present

## 2020-12-07 DIAGNOSIS — E1165 Type 2 diabetes mellitus with hyperglycemia: Secondary | ICD-10-CM | POA: Diagnosis not present

## 2020-12-07 DIAGNOSIS — M1710 Unilateral primary osteoarthritis, unspecified knee: Secondary | ICD-10-CM | POA: Diagnosis not present

## 2020-12-07 DIAGNOSIS — I10 Essential (primary) hypertension: Secondary | ICD-10-CM | POA: Diagnosis not present

## 2020-12-07 DIAGNOSIS — E7800 Pure hypercholesterolemia, unspecified: Secondary | ICD-10-CM | POA: Diagnosis not present

## 2020-12-08 DIAGNOSIS — I10 Essential (primary) hypertension: Secondary | ICD-10-CM | POA: Diagnosis not present

## 2020-12-08 DIAGNOSIS — E1165 Type 2 diabetes mellitus with hyperglycemia: Secondary | ICD-10-CM | POA: Diagnosis not present

## 2020-12-09 DIAGNOSIS — M545 Low back pain, unspecified: Secondary | ICD-10-CM | POA: Diagnosis not present

## 2020-12-09 DIAGNOSIS — Z6829 Body mass index (BMI) 29.0-29.9, adult: Secondary | ICD-10-CM | POA: Diagnosis not present

## 2020-12-09 DIAGNOSIS — E663 Overweight: Secondary | ICD-10-CM | POA: Diagnosis not present

## 2020-12-09 DIAGNOSIS — M25562 Pain in left knee: Secondary | ICD-10-CM | POA: Diagnosis not present

## 2020-12-09 DIAGNOSIS — R5383 Other fatigue: Secondary | ICD-10-CM | POA: Diagnosis not present

## 2020-12-09 DIAGNOSIS — M150 Primary generalized (osteo)arthritis: Secondary | ICD-10-CM | POA: Diagnosis not present

## 2021-01-04 DIAGNOSIS — I10 Essential (primary) hypertension: Secondary | ICD-10-CM | POA: Diagnosis not present

## 2021-01-04 DIAGNOSIS — D225 Melanocytic nevi of trunk: Secondary | ICD-10-CM | POA: Diagnosis not present

## 2021-01-04 DIAGNOSIS — D696 Thrombocytopenia, unspecified: Secondary | ICD-10-CM | POA: Diagnosis not present

## 2021-01-04 DIAGNOSIS — Z299 Encounter for prophylactic measures, unspecified: Secondary | ICD-10-CM | POA: Diagnosis not present

## 2021-01-07 DIAGNOSIS — I10 Essential (primary) hypertension: Secondary | ICD-10-CM | POA: Diagnosis not present

## 2021-01-07 DIAGNOSIS — E1165 Type 2 diabetes mellitus with hyperglycemia: Secondary | ICD-10-CM | POA: Diagnosis not present

## 2021-01-12 DIAGNOSIS — E1165 Type 2 diabetes mellitus with hyperglycemia: Secondary | ICD-10-CM | POA: Diagnosis not present

## 2021-01-12 DIAGNOSIS — Z299 Encounter for prophylactic measures, unspecified: Secondary | ICD-10-CM | POA: Diagnosis not present

## 2021-02-05 DIAGNOSIS — I10 Essential (primary) hypertension: Secondary | ICD-10-CM | POA: Diagnosis not present

## 2021-02-05 DIAGNOSIS — E1165 Type 2 diabetes mellitus with hyperglycemia: Secondary | ICD-10-CM | POA: Diagnosis not present

## 2021-03-10 DIAGNOSIS — M1710 Unilateral primary osteoarthritis, unspecified knee: Secondary | ICD-10-CM | POA: Diagnosis not present

## 2021-03-10 DIAGNOSIS — E1165 Type 2 diabetes mellitus with hyperglycemia: Secondary | ICD-10-CM | POA: Diagnosis not present

## 2021-03-10 DIAGNOSIS — E7800 Pure hypercholesterolemia, unspecified: Secondary | ICD-10-CM | POA: Diagnosis not present

## 2021-03-10 DIAGNOSIS — I10 Essential (primary) hypertension: Secondary | ICD-10-CM | POA: Diagnosis not present

## 2021-03-17 DIAGNOSIS — K219 Gastro-esophageal reflux disease without esophagitis: Secondary | ICD-10-CM | POA: Diagnosis not present

## 2021-03-17 DIAGNOSIS — R0602 Shortness of breath: Secondary | ICD-10-CM | POA: Diagnosis not present

## 2021-03-17 DIAGNOSIS — R053 Chronic cough: Secondary | ICD-10-CM | POA: Diagnosis not present

## 2021-04-02 DIAGNOSIS — M170 Bilateral primary osteoarthritis of knee: Secondary | ICD-10-CM | POA: Diagnosis not present

## 2021-04-06 DIAGNOSIS — Z23 Encounter for immunization: Secondary | ICD-10-CM | POA: Diagnosis not present

## 2021-04-09 DIAGNOSIS — E1165 Type 2 diabetes mellitus with hyperglycemia: Secondary | ICD-10-CM | POA: Diagnosis not present

## 2021-04-09 DIAGNOSIS — I10 Essential (primary) hypertension: Secondary | ICD-10-CM | POA: Diagnosis not present

## 2021-04-13 DIAGNOSIS — Z7984 Long term (current) use of oral hypoglycemic drugs: Secondary | ICD-10-CM | POA: Diagnosis not present

## 2021-04-13 DIAGNOSIS — R053 Chronic cough: Secondary | ICD-10-CM | POA: Diagnosis not present

## 2021-04-13 DIAGNOSIS — M8580 Other specified disorders of bone density and structure, unspecified site: Secondary | ICD-10-CM | POA: Diagnosis not present

## 2021-04-13 DIAGNOSIS — M160 Bilateral primary osteoarthritis of hip: Secondary | ICD-10-CM | POA: Diagnosis not present

## 2021-04-13 DIAGNOSIS — N183 Chronic kidney disease, stage 3 unspecified: Secondary | ICD-10-CM | POA: Diagnosis not present

## 2021-04-13 DIAGNOSIS — E1121 Type 2 diabetes mellitus with diabetic nephropathy: Secondary | ICD-10-CM | POA: Diagnosis not present

## 2021-04-13 DIAGNOSIS — I700 Atherosclerosis of aorta: Secondary | ICD-10-CM | POA: Diagnosis not present

## 2021-04-13 DIAGNOSIS — I10 Essential (primary) hypertension: Secondary | ICD-10-CM | POA: Diagnosis not present

## 2021-04-14 DIAGNOSIS — Z20828 Contact with and (suspected) exposure to other viral communicable diseases: Secondary | ICD-10-CM | POA: Diagnosis not present

## 2021-04-19 DIAGNOSIS — Z23 Encounter for immunization: Secondary | ICD-10-CM | POA: Diagnosis not present

## 2021-04-26 DIAGNOSIS — H401432 Capsular glaucoma with pseudoexfoliation of lens, bilateral, moderate stage: Secondary | ICD-10-CM | POA: Diagnosis not present

## 2021-04-26 DIAGNOSIS — H0288A Meibomian gland dysfunction right eye, upper and lower eyelids: Secondary | ICD-10-CM | POA: Diagnosis not present

## 2021-04-26 DIAGNOSIS — H0288B Meibomian gland dysfunction left eye, upper and lower eyelids: Secondary | ICD-10-CM | POA: Diagnosis not present

## 2021-04-26 DIAGNOSIS — H00021 Hordeolum internum right upper eyelid: Secondary | ICD-10-CM | POA: Diagnosis not present

## 2021-04-26 DIAGNOSIS — Z961 Presence of intraocular lens: Secondary | ICD-10-CM | POA: Diagnosis not present

## 2021-05-10 DIAGNOSIS — E1165 Type 2 diabetes mellitus with hyperglycemia: Secondary | ICD-10-CM | POA: Diagnosis not present

## 2021-05-10 DIAGNOSIS — I10 Essential (primary) hypertension: Secondary | ICD-10-CM | POA: Diagnosis not present

## 2021-05-13 DIAGNOSIS — Z20828 Contact with and (suspected) exposure to other viral communicable diseases: Secondary | ICD-10-CM | POA: Diagnosis not present

## 2021-05-14 IMAGING — RF DG SWALLOWING FUNCTION
12 of 16 series · 12 of 24 positions shown · non-contrast
Comparison: None.

CLINICAL DATA: Dysphagia. Shortness of breath.  Chronic cough.

EXAM:
MODIFIED BARIUM SWALLOW
TECHNIQUE: Different consistencies of barium were administered orally to the
patient by the Speech Pathologist. Imaging of the pharynx was
performed in the lateral projection. The radiologist was present in
the fluoroscopy room for this study, providing personal supervision.
FLUOROSCOPY TIME:  Fluoroscopy Time:  0 minutes and 59 seconds.
Radiation Exposure Index (if provided by the fluoroscopic device):
19.3 mGy
Number of Acquired Spot Images:

[Series 1: run · 1 of 23 frames shown (1 of 12)]
[frame 20/23]
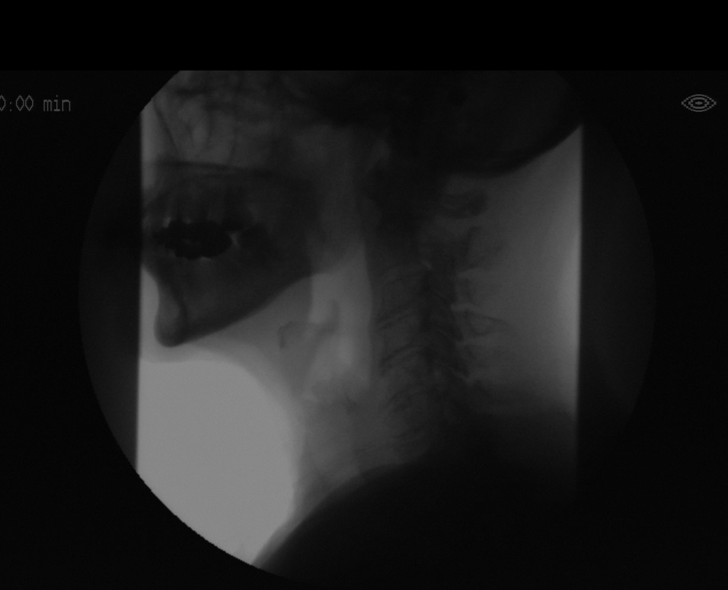

[Series 3: run · 1 of 68 frames shown (2 of 12)]
[frame 11/68]
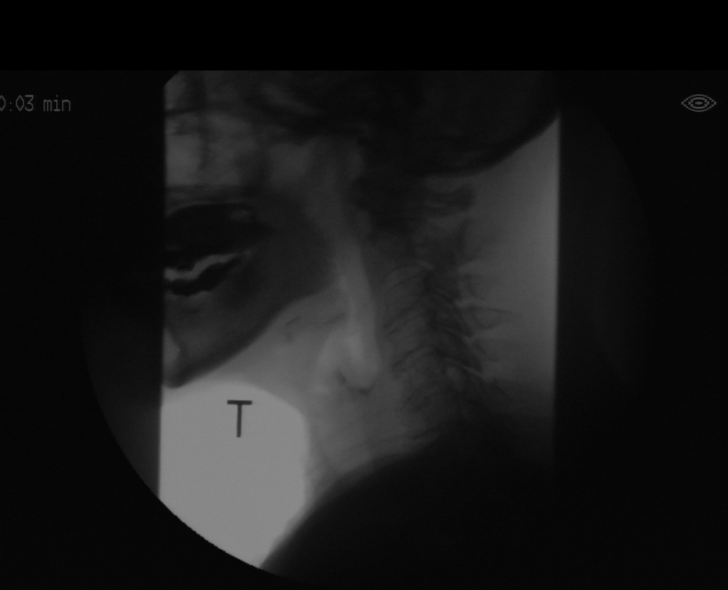

[Series 4: run · 1 of 90 frames shown (3 of 12)]
[frame 77/90]
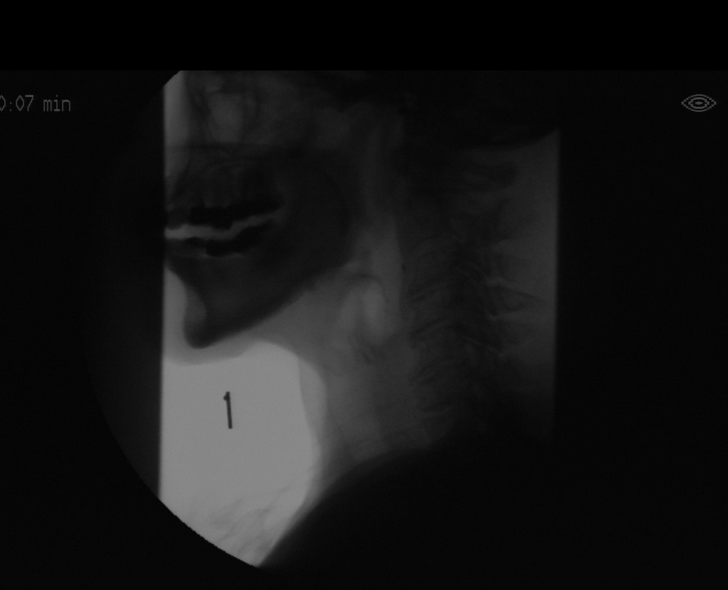

[Series 5: run · 1 of 90 frames shown (4 of 12)]
[frame 88/90]
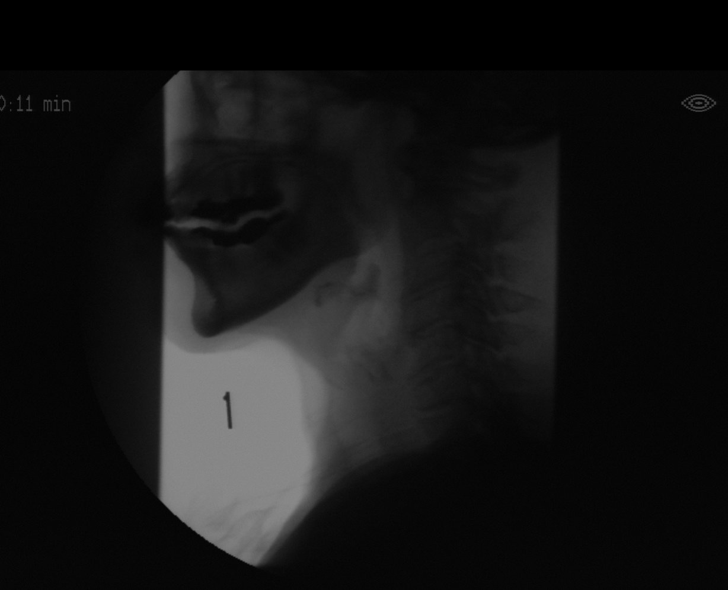

[Series 7: run · 1 of 152 frames shown (5 of 12)]
[frame 77/152]
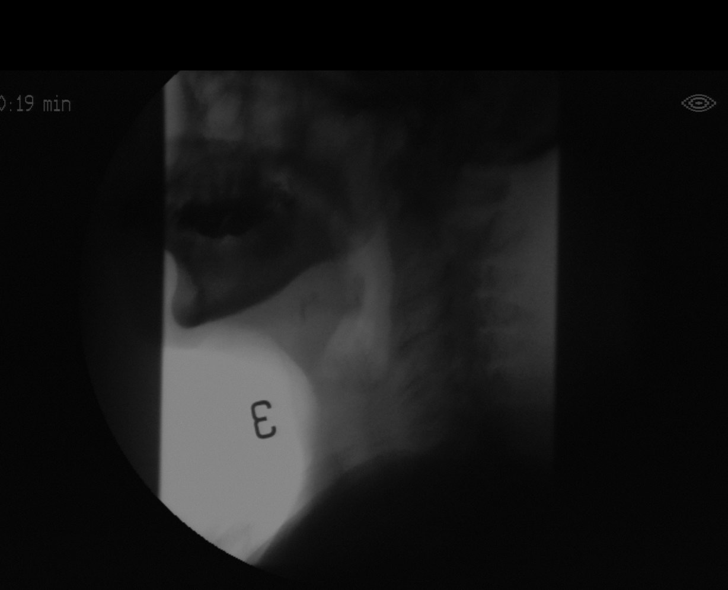

[Series 8: run · 1 of 250 frames shown (6 of 12)]
[frame 126/250]
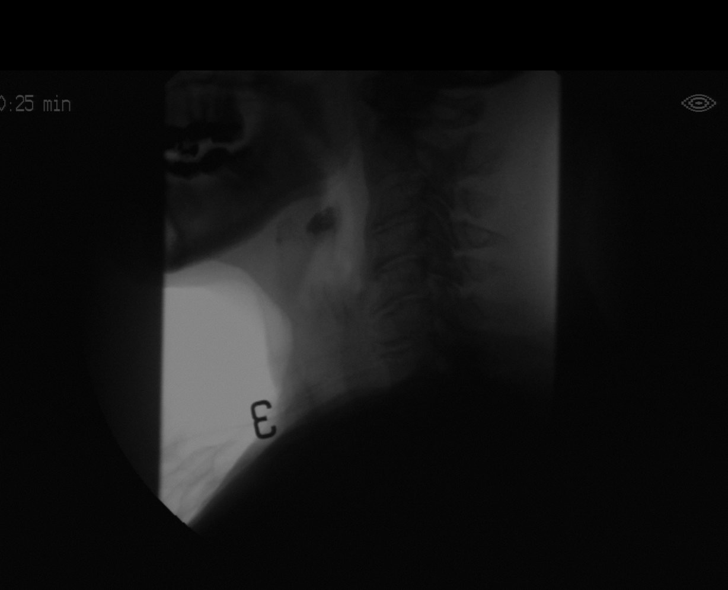

[Series 10: run · 1 of 205 frames shown (7 of 12)]
[frame 31/205]
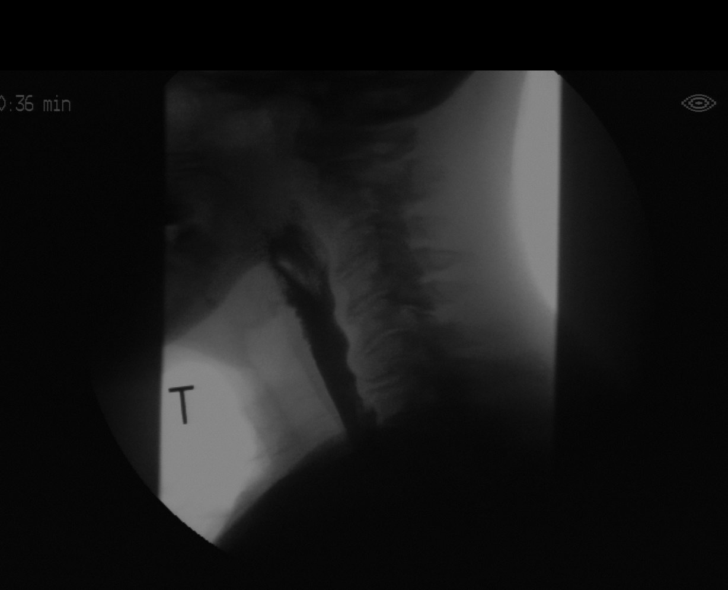

[Series 11: run · 1 of 112 frames shown (8 of 12)]
[frame 17/112]
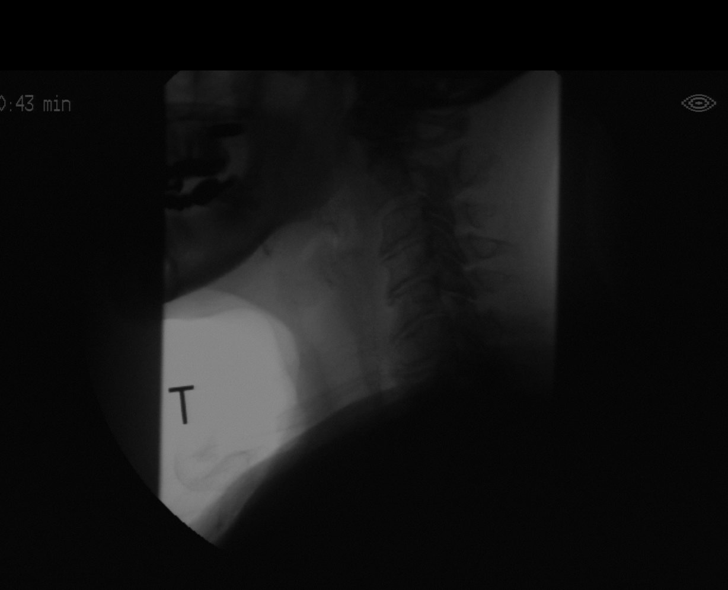

[Series 12: run · 1 of 161 frames shown (9 of 12)]
[frame 137/161]
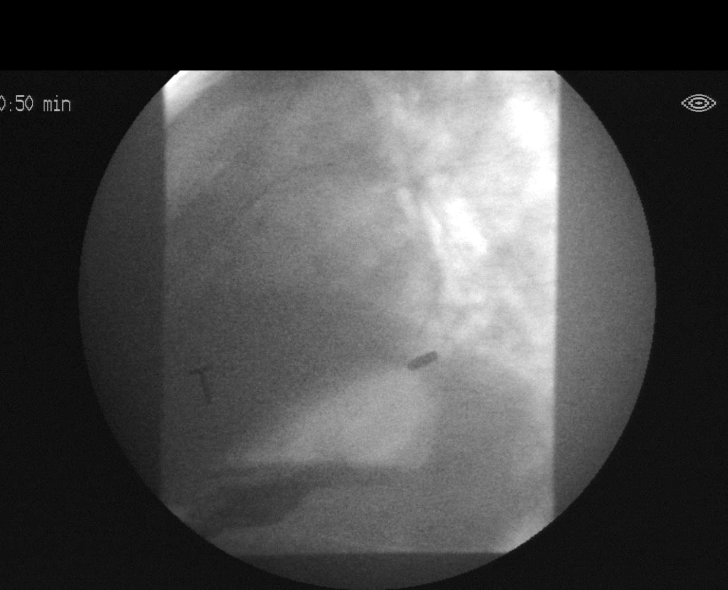

[Series 14: run · 1 of 20 frames shown (10 of 12)]
[frame 3/20]
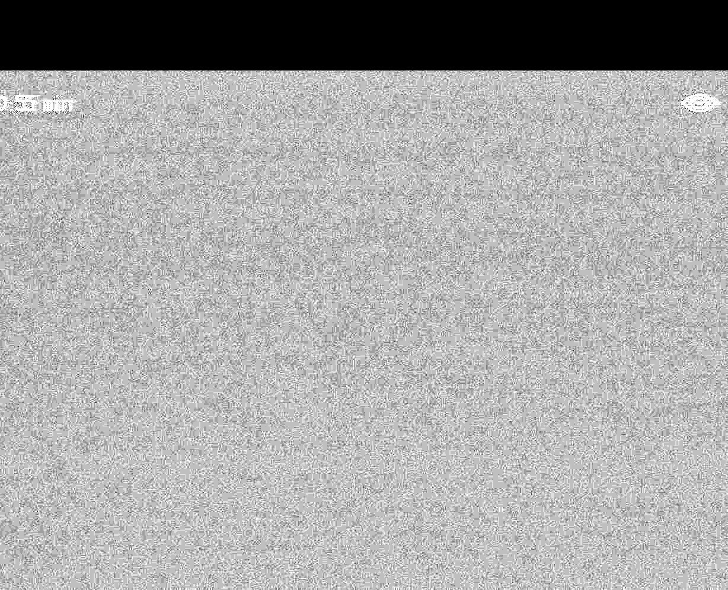

[Series 15: run · 1 of 39 frames shown (11 of 12)]
[frame 34/39]
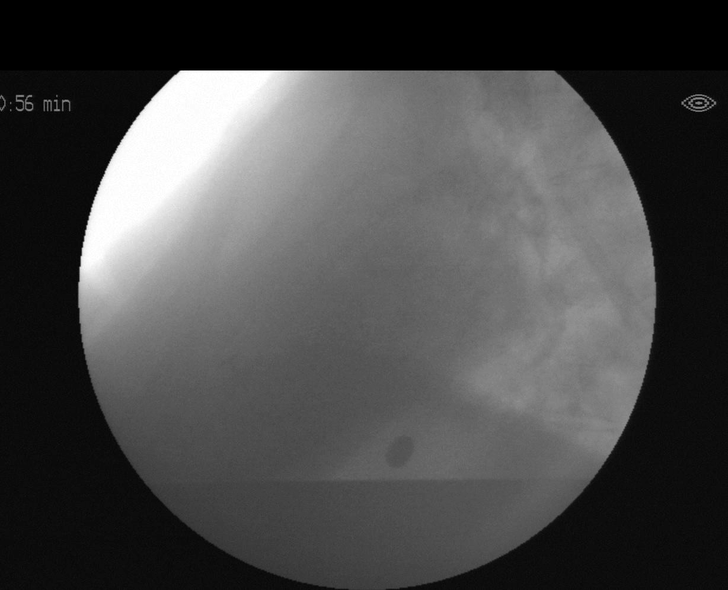

[Series 16: run · 1 of 82 frames shown (12 of 12)]
[frame 70/82]
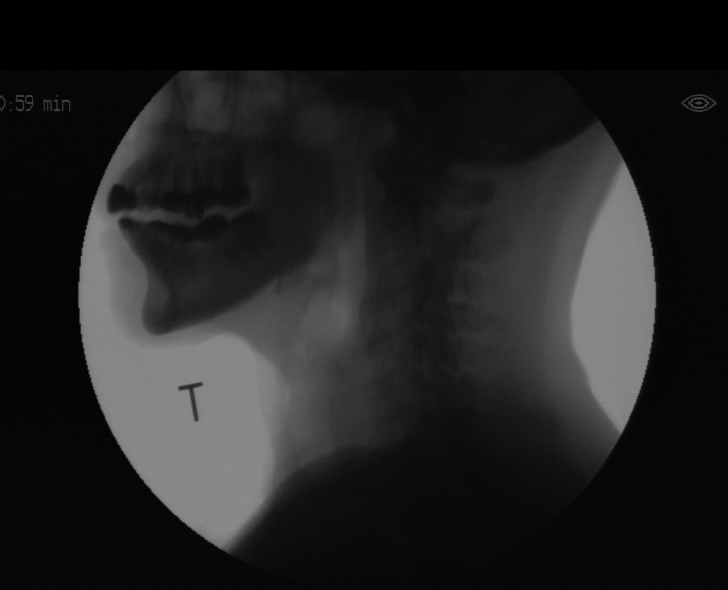

[12 of 24 positions shown; findings below may reference images not displayed]

FINDINGS: Patient was given barium of varying consistencies by mouth. No
aspiration observed.
IMPRESSION: No evidence for aspiration.

Please refer to the Speech Pathologists report for complete details
and recommendations.

## 2021-06-07 DIAGNOSIS — H401432 Capsular glaucoma with pseudoexfoliation of lens, bilateral, moderate stage: Secondary | ICD-10-CM | POA: Diagnosis not present

## 2021-06-07 DIAGNOSIS — H0288B Meibomian gland dysfunction left eye, upper and lower eyelids: Secondary | ICD-10-CM | POA: Diagnosis not present

## 2021-06-07 DIAGNOSIS — H00021 Hordeolum internum right upper eyelid: Secondary | ICD-10-CM | POA: Diagnosis not present

## 2021-06-07 DIAGNOSIS — H0288A Meibomian gland dysfunction right eye, upper and lower eyelids: Secondary | ICD-10-CM | POA: Diagnosis not present

## 2021-06-07 DIAGNOSIS — Z961 Presence of intraocular lens: Secondary | ICD-10-CM | POA: Diagnosis not present

## 2021-06-09 DIAGNOSIS — E1165 Type 2 diabetes mellitus with hyperglycemia: Secondary | ICD-10-CM | POA: Diagnosis not present

## 2021-06-09 DIAGNOSIS — I10 Essential (primary) hypertension: Secondary | ICD-10-CM | POA: Diagnosis not present

## 2021-06-14 DIAGNOSIS — Z20828 Contact with and (suspected) exposure to other viral communicable diseases: Secondary | ICD-10-CM | POA: Diagnosis not present

## 2021-06-28 DIAGNOSIS — N184 Chronic kidney disease, stage 4 (severe): Secondary | ICD-10-CM | POA: Diagnosis not present

## 2021-06-28 DIAGNOSIS — Z299 Encounter for prophylactic measures, unspecified: Secondary | ICD-10-CM | POA: Diagnosis not present

## 2021-06-28 DIAGNOSIS — E1122 Type 2 diabetes mellitus with diabetic chronic kidney disease: Secondary | ICD-10-CM | POA: Diagnosis not present

## 2021-06-28 DIAGNOSIS — R809 Proteinuria, unspecified: Secondary | ICD-10-CM | POA: Diagnosis not present

## 2021-06-28 DIAGNOSIS — I10 Essential (primary) hypertension: Secondary | ICD-10-CM | POA: Diagnosis not present

## 2021-07-09 DIAGNOSIS — I10 Essential (primary) hypertension: Secondary | ICD-10-CM | POA: Diagnosis not present

## 2021-07-09 DIAGNOSIS — E1165 Type 2 diabetes mellitus with hyperglycemia: Secondary | ICD-10-CM | POA: Diagnosis not present

## 2021-07-15 DIAGNOSIS — Z20828 Contact with and (suspected) exposure to other viral communicable diseases: Secondary | ICD-10-CM | POA: Diagnosis not present

## 2021-07-25 DIAGNOSIS — I214 Non-ST elevation (NSTEMI) myocardial infarction: Secondary | ICD-10-CM | POA: Diagnosis not present

## 2021-07-25 DIAGNOSIS — E119 Type 2 diabetes mellitus without complications: Secondary | ICD-10-CM | POA: Diagnosis not present

## 2021-07-25 DIAGNOSIS — R079 Chest pain, unspecified: Secondary | ICD-10-CM | POA: Diagnosis not present

## 2021-07-25 DIAGNOSIS — R109 Unspecified abdominal pain: Secondary | ICD-10-CM | POA: Diagnosis not present

## 2021-07-25 DIAGNOSIS — R1110 Vomiting, unspecified: Secondary | ICD-10-CM | POA: Diagnosis not present

## 2021-07-25 DIAGNOSIS — R1084 Generalized abdominal pain: Secondary | ICD-10-CM | POA: Diagnosis not present

## 2021-07-25 DIAGNOSIS — R112 Nausea with vomiting, unspecified: Secondary | ICD-10-CM | POA: Diagnosis not present

## 2021-07-25 DIAGNOSIS — I10 Essential (primary) hypertension: Secondary | ICD-10-CM | POA: Diagnosis not present

## 2021-07-25 DIAGNOSIS — R0689 Other abnormalities of breathing: Secondary | ICD-10-CM | POA: Diagnosis not present

## 2021-07-25 DIAGNOSIS — Z743 Need for continuous supervision: Secondary | ICD-10-CM | POA: Diagnosis not present

## 2021-07-25 DIAGNOSIS — I517 Cardiomegaly: Secondary | ICD-10-CM | POA: Diagnosis not present

## 2021-07-25 DIAGNOSIS — R072 Precordial pain: Secondary | ICD-10-CM | POA: Diagnosis not present

## 2021-07-25 DIAGNOSIS — R778 Other specified abnormalities of plasma proteins: Secondary | ICD-10-CM | POA: Diagnosis not present

## 2021-07-25 DIAGNOSIS — R531 Weakness: Secondary | ICD-10-CM | POA: Diagnosis not present

## 2021-07-25 DIAGNOSIS — R110 Nausea: Secondary | ICD-10-CM | POA: Diagnosis not present

## 2021-07-25 DIAGNOSIS — U071 COVID-19: Secondary | ICD-10-CM | POA: Diagnosis not present

## 2021-07-26 DIAGNOSIS — Z8616 Personal history of COVID-19: Secondary | ICD-10-CM | POA: Diagnosis not present

## 2021-07-26 DIAGNOSIS — Z955 Presence of coronary angioplasty implant and graft: Secondary | ICD-10-CM | POA: Diagnosis not present

## 2021-07-26 DIAGNOSIS — R001 Bradycardia, unspecified: Secondary | ICD-10-CM | POA: Diagnosis not present

## 2021-07-26 DIAGNOSIS — R053 Chronic cough: Secondary | ICD-10-CM | POA: Diagnosis not present

## 2021-07-26 DIAGNOSIS — R778 Other specified abnormalities of plasma proteins: Secondary | ICD-10-CM | POA: Diagnosis not present

## 2021-07-26 DIAGNOSIS — R112 Nausea with vomiting, unspecified: Secondary | ICD-10-CM | POA: Diagnosis not present

## 2021-07-26 DIAGNOSIS — Z885 Allergy status to narcotic agent status: Secondary | ICD-10-CM | POA: Diagnosis not present

## 2021-07-26 DIAGNOSIS — R079 Chest pain, unspecified: Secondary | ICD-10-CM | POA: Diagnosis not present

## 2021-07-26 DIAGNOSIS — I119 Hypertensive heart disease without heart failure: Secondary | ICD-10-CM | POA: Diagnosis not present

## 2021-07-26 DIAGNOSIS — K219 Gastro-esophageal reflux disease without esophagitis: Secondary | ICD-10-CM | POA: Diagnosis not present

## 2021-07-26 DIAGNOSIS — U071 COVID-19: Secondary | ICD-10-CM | POA: Diagnosis not present

## 2021-07-26 DIAGNOSIS — Z7984 Long term (current) use of oral hypoglycemic drugs: Secondary | ICD-10-CM | POA: Diagnosis not present

## 2021-07-26 DIAGNOSIS — I10 Essential (primary) hypertension: Secondary | ICD-10-CM | POA: Diagnosis not present

## 2021-07-26 DIAGNOSIS — I083 Combined rheumatic disorders of mitral, aortic and tricuspid valves: Secondary | ICD-10-CM | POA: Diagnosis not present

## 2021-07-26 DIAGNOSIS — H9190 Unspecified hearing loss, unspecified ear: Secondary | ICD-10-CM | POA: Diagnosis not present

## 2021-07-26 DIAGNOSIS — Z79899 Other long term (current) drug therapy: Secondary | ICD-10-CM | POA: Diagnosis not present

## 2021-07-26 DIAGNOSIS — I214 Non-ST elevation (NSTEMI) myocardial infarction: Secondary | ICD-10-CM | POA: Diagnosis not present

## 2021-07-26 DIAGNOSIS — E119 Type 2 diabetes mellitus without complications: Secondary | ICD-10-CM | POA: Diagnosis not present

## 2021-07-26 DIAGNOSIS — Z833 Family history of diabetes mellitus: Secondary | ICD-10-CM | POA: Diagnosis not present

## 2021-07-26 DIAGNOSIS — Z91040 Latex allergy status: Secondary | ICD-10-CM | POA: Diagnosis not present

## 2021-07-26 DIAGNOSIS — I2510 Atherosclerotic heart disease of native coronary artery without angina pectoris: Secondary | ICD-10-CM | POA: Diagnosis not present

## 2021-07-26 DIAGNOSIS — D649 Anemia, unspecified: Secondary | ICD-10-CM | POA: Diagnosis not present

## 2021-07-29 DIAGNOSIS — R079 Chest pain, unspecified: Secondary | ICD-10-CM | POA: Diagnosis not present

## 2021-08-02 DIAGNOSIS — E1165 Type 2 diabetes mellitus with hyperglycemia: Secondary | ICD-10-CM | POA: Diagnosis not present

## 2021-08-02 DIAGNOSIS — N184 Chronic kidney disease, stage 4 (severe): Secondary | ICD-10-CM | POA: Diagnosis not present

## 2021-08-02 DIAGNOSIS — Z09 Encounter for follow-up examination after completed treatment for conditions other than malignant neoplasm: Secondary | ICD-10-CM | POA: Diagnosis not present

## 2021-08-02 DIAGNOSIS — E1122 Type 2 diabetes mellitus with diabetic chronic kidney disease: Secondary | ICD-10-CM | POA: Diagnosis not present

## 2021-08-02 DIAGNOSIS — I10 Essential (primary) hypertension: Secondary | ICD-10-CM | POA: Diagnosis not present

## 2021-08-02 DIAGNOSIS — I2510 Atherosclerotic heart disease of native coronary artery without angina pectoris: Secondary | ICD-10-CM | POA: Diagnosis not present

## 2021-08-08 DIAGNOSIS — I10 Essential (primary) hypertension: Secondary | ICD-10-CM | POA: Diagnosis not present

## 2021-08-08 DIAGNOSIS — E1165 Type 2 diabetes mellitus with hyperglycemia: Secondary | ICD-10-CM | POA: Diagnosis not present

## 2021-08-23 DIAGNOSIS — I10 Essential (primary) hypertension: Secondary | ICD-10-CM | POA: Diagnosis not present

## 2021-08-23 DIAGNOSIS — Z1339 Encounter for screening examination for other mental health and behavioral disorders: Secondary | ICD-10-CM | POA: Diagnosis not present

## 2021-08-23 DIAGNOSIS — E7800 Pure hypercholesterolemia, unspecified: Secondary | ICD-10-CM | POA: Diagnosis not present

## 2021-08-23 DIAGNOSIS — R5383 Other fatigue: Secondary | ICD-10-CM | POA: Diagnosis not present

## 2021-08-23 DIAGNOSIS — Z299 Encounter for prophylactic measures, unspecified: Secondary | ICD-10-CM | POA: Diagnosis not present

## 2021-08-23 DIAGNOSIS — Z6830 Body mass index (BMI) 30.0-30.9, adult: Secondary | ICD-10-CM | POA: Diagnosis not present

## 2021-08-23 DIAGNOSIS — Z7189 Other specified counseling: Secondary | ICD-10-CM | POA: Diagnosis not present

## 2021-08-23 DIAGNOSIS — Z0000 Encounter for general adult medical examination without abnormal findings: Secondary | ICD-10-CM | POA: Diagnosis not present

## 2021-08-23 DIAGNOSIS — Z1331 Encounter for screening for depression: Secondary | ICD-10-CM | POA: Diagnosis not present

## 2021-08-23 DIAGNOSIS — Z79899 Other long term (current) drug therapy: Secondary | ICD-10-CM | POA: Diagnosis not present

## 2021-08-26 ENCOUNTER — Other Ambulatory Visit (HOSPITAL_COMMUNITY): Payer: Self-pay

## 2021-08-26 DIAGNOSIS — I214 Non-ST elevation (NSTEMI) myocardial infarction: Secondary | ICD-10-CM

## 2021-09-07 DIAGNOSIS — E1165 Type 2 diabetes mellitus with hyperglycemia: Secondary | ICD-10-CM | POA: Diagnosis not present

## 2021-09-07 DIAGNOSIS — I10 Essential (primary) hypertension: Secondary | ICD-10-CM | POA: Diagnosis not present

## 2021-09-08 ENCOUNTER — Other Ambulatory Visit: Payer: Self-pay

## 2021-09-08 ENCOUNTER — Ambulatory Visit (INDEPENDENT_AMBULATORY_CARE_PROVIDER_SITE_OTHER): Payer: Medicare Other | Admitting: Internal Medicine

## 2021-09-08 ENCOUNTER — Encounter: Payer: Self-pay | Admitting: Internal Medicine

## 2021-09-08 ENCOUNTER — Other Ambulatory Visit (HOSPITAL_COMMUNITY)
Admission: RE | Admit: 2021-09-08 | Discharge: 2021-09-08 | Disposition: A | Discharge status: Home / Self Care | Payer: Medicare Other | Source: Ambulatory Visit | Attending: Internal Medicine | Admitting: Internal Medicine

## 2021-09-08 VITALS — BP 146/52 | HR 73 | Ht 68.0 in | Wt 171.0 lb

## 2021-09-08 DIAGNOSIS — I25118 Atherosclerotic heart disease of native coronary artery with other forms of angina pectoris: Secondary | ICD-10-CM | POA: Diagnosis not present

## 2021-09-08 DIAGNOSIS — I10 Essential (primary) hypertension: Secondary | ICD-10-CM

## 2021-09-08 DIAGNOSIS — I351 Nonrheumatic aortic (valve) insufficiency: Secondary | ICD-10-CM | POA: Diagnosis not present

## 2021-09-08 DIAGNOSIS — R600 Localized edema: Secondary | ICD-10-CM

## 2021-09-08 LAB — BASIC METABOLIC PANEL
Anion gap: 11 (ref 5–15)
BUN: 37 mg/dL — ABNORMAL HIGH (ref 8–23)
CO2: 23 mmol/L (ref 22–32)
Calcium: 9.3 mg/dL (ref 8.9–10.3)
Chloride: 104 mmol/L (ref 98–111)
Creatinine, Ser: 1.76 mg/dL — ABNORMAL HIGH (ref 0.44–1.00)
GFR, Estimated: 28 mL/min — ABNORMAL LOW (ref >60)
Glucose, Bld: 103 mg/dL — ABNORMAL HIGH (ref 70–99)
Potassium: 3.8 mmol/L (ref 3.5–5.1)
Sodium: 138 mmol/L (ref 135–145)

## 2021-09-08 LAB — BRAIN NATRIURETIC PEPTIDE: B Natriuretic Peptide: 169 pg/mL — ABNORMAL HIGH (ref 0.0–100.0)

## 2021-09-08 MED ORDER — NITROGLYCERIN 0.4 MG SL SUBL: 0.4000 mg | SUBLINGUAL_TABLET | SUBLINGUAL | 3 refills | Status: DC | PRN

## 2021-09-08 NOTE — Progress Notes (Signed)
?Cardiology Office Note:   ? ?Date:  09/08/2021  ? ?ID:  [name], DOB 1935/04/18, MRN [id number] ? ?PCP:  Glenda Chroman, MD ?  ?[facility] Providers ?Cardiologist:  Kate Sable, MD (Inactive)    ? ?Referring MD: Glenda Chroman, MD  ? ?CC: re-establish after NSTEMI ?Consulted for the evaluation of CAD at the behest of Glenda Chroman, MD ? ?History of Present Illness:   ? ?[name] is a 87 y.o. female with a hx of HTN with DM, mild AI, MR, TR seen 07/27/21 with sudden onset chest heaving.  Peak hs troponin 305.  Found to have non obstructive CAD by R rad Cath.  DS on ASA, lipitor, Toprol XL and HCTZ/Valsatan. ? ?Distantly, patient was seen by this practice. ? ?Patient notes that she is doing fine now.   ?Notes that her initial symptoms were not CP but were SOB.  Called EMS and and just had emesis.  Her anginal equivalent was chest pressure. ? ?Notes that she does have residual chest pressure. No SOB/DOE and no PND/Orthopnea.  Does note that she can't get a deep breath when she is signing and wonders if this is her heart.  She notes belching and burning CP that feels different that her past angina. No weight gain or leg swelling.  No palpitations or syncope. ? ?Ambulatory blood pressure SBP 130. ? ? ?Past Medical History:  ?Diagnosis Date  ? Cramps, extremity   ? legs and hands  ? Diabetes ([place])   ? type II  ? Enterococcus UTI 02/03/2016  ? H/O cardiovascular stress test 03/05/2009  ? normal, no ischemia  ? H/O Doppler ultrasound   ? lower ext arterial doppler 04/13/09-no evidence of arterial insufficiency, normal values; distal aorta 2.4x2.7  ? HTN (hypertension)   ? Murmur, cardiac   ? echo 02/25/13- EF 60-65%, impaired relaxation-grade 1 diastolic dysfunction, mild concentric lvh, mild to moderate aortic regurgitation  ? Pneumonia   ? May 2017  ? Postoperative anemia due to acute blood loss 02/02/2016  ? Primary localized osteoarthritis of right knee   ? TIA (transient ischemic attack)   ?  carotid doppler 05/08/11-normal patency  ? ? ?Past Surgical History:  ?Procedure Laterality Date  ? ABDOMINAL HYSTERECTOMY    ? BUNIONECTOMY Left   ? COLONOSCOPY    ? TOTAL KNEE ARTHROPLASTY Right 02/01/2016  ? Procedure: TOTAL KNEE ARTHROPLASTY;  Surgeon: Elsie Saas, MD;  Location: [place];  Service: Orthopedics;  Laterality: Right;  ? ? ?Current Medications: ?Current Meds  ?Medication Sig  ? atorvastatin (LIPITOR) 80 MG tablet Take 80 mg by mouth daily.  ? cetirizine (ZYRTEC) 10 MG tablet Take 1 tablet (10 mg total) by mouth daily.  ? diclofenac (VOLTAREN) 75 MG EC tablet Take 75 mg by mouth 2 (two) times daily.  ? glipiZIDE (GLUCOTROL XL) 5 MG 24 hr tablet Take 5 mg by mouth daily.  ? metFORMIN (GLUCOPHAGE) 500 MG tablet Take 500 mg by mouth daily with breakfast.   ? metoprolol (LOPRESSOR) 50 MG tablet Take 50 mg by mouth 2 (two) times daily.  ? montelukast (SINGULAIR) 10 MG tablet Take 1 tablet (10 mg total) by mouth at bedtime.  ? nitroGLYCERIN (NITROSTAT) 0.4 MG SL tablet Place 1 tablet (0.4 mg total) under the tongue every 5 (five) minutes as needed.  ? omeprazole (PRILOSEC) 20 MG capsule Take 1 capsule (20 mg total) by mouth daily.  ? valsartan-hydrochlorothiazide (DIOVAN-HCT) 320-12.5 MG per tablet Take 1 tablet by  mouth daily.   ?  ? ?Allergies:   Codeine and Milk-related compounds  ? ?Social History  ? ?Socioeconomic History  ? Marital status: Widowed  ?  Spouse name: Not on file  ? Number of children: 2  ? Years of education: Not on file  ? Highest education level: Not on file  ?Occupational History  ? Not on file  ?Tobacco Use  ? Smoking status: Never  ? Smokeless tobacco: Never  ?Vaping Use  ? Vaping Use: Never used  ?Substance and Sexual Activity  ? Alcohol use: No  ?  Alcohol/week: 0.0 standard drinks  ? Drug use: No  ? Sexual activity: Not on file  ?Other Topics Concern  ? Not on file  ?Social History Narrative  ? Step son lives with her.  She is a widow.  ? ?Social Determinants of Health   ? ?Financial Resource Strain: Not on file  ?Food Insecurity: Not on file  ?Transportation Needs: Not on file  ?Physical Activity: Not on file  ?Stress: Not on file  ?Social Connections: Not on file  ?  ? ?Family History: ?The patient's family history includes Cancer in her father and sister; Diabetes in her brother, brother, and brother; Lung cancer in her father; Stroke in her mother. ? ?ROS:   ?Please see the history of present illness.    ? All other systems reviewed and are negative. ? ?EKGs/Labs/Other Studies Reviewed:   ? ?The following studies were reviewed today: ? ?EKG:  EKG is  ordered today.  The ekg ordered today demonstrates  ?09/08/21: SR 73 Anterior infarct ? ?Recent Labs: ?No results found for requested labs within last 8760 hours.  ?Recent Lipid Panel ?   ?Component Value Date/Time  ? CHOL 165 03/01/2013 1034  ? TRIG 214 (H) 03/01/2013 1034  ? HDL 43 03/01/2013 1034  ? CHOLHDL 3.8 03/01/2013 1034  ? VLDL 43 (H) 03/01/2013 1034  ? [place] 79 03/01/2013 1034  ?    ? ?Physical Exam:   ? ?VS:  BP (!) 146/52   Pulse 73   Ht 5\' 8"  (1.727 m)   Wt 171 lb (77.6 kg)   SpO2 98%   BMI 26.00 kg/m?    ? ?Wt Readings from Last 3 Encounters:  ?09/08/21 171 lb (77.6 kg)  ?01/24/20 178 lb (80.7 kg)  ?12/10/19 177 lb (80.3 kg)  ?  ? ?Gen: No distress ?Neck: No JVD ?Cardiac: No Rubs or Gallops, I/VI murmur, RRR +2 radial pulses ?Respiratory: Clear to auscultation bilaterally, normal effort, normal  respiratory rate ?GI: Soft, nontender, non-distended  ?MS: +1 pitting edema;  moves all extremities ?Integument: Skin feels warm ?Neuro:  At time of evaluation, alert and oriented to person/place/time/situation  ?Psych: Anxious affect ? ? ?ASSESSMENT:   ? ?1. Hypertension, unspecified type   ?2. Lower extremity edema   ?3. Coronary artery disease of native artery of native heart with stable angina pectoris ([place])   ?4. Nonrheumatic aortic valve insufficiency   ? ?PLAN:   ? ?Non-obstructive CAD ?HTN and DM, HLD ?Mild AI,  MR, and TR with plans for 2026 echo unless new sx ?Coronary Artery Disease; Nonobstructive ?LE edema ?- stable angina on therapy ?- continue ASA 81 mg ?- continue statin, goal LDL < 55 ?- Starting PRN nitrates ?- continue metoprolol 50 mg ?- continue amb BP monitoring, low threshold to start ARB ?- discussed cardiac rehab- OK to start ?- will check BMP and BNP ? ? ? ?Cardiac Rehabilitation Eligibility Assessment  ?    ? ? ? ?  Medication Adjustments/Labs and Tests Ordered: ?Current medicines are reviewed at length with the patient today.  Concerns regarding medicines are outlined above.  ?Orders Placed This Encounter  ?Procedures  ? Basic metabolic panel  ? B Nat Peptide  ? EKG 12-Lead  ? ?Meds ordered this encounter  ?Medications  ? nitroGLYCERIN (NITROSTAT) 0.4 MG SL tablet  ?  Sig: Place 1 tablet (0.4 mg total) under the tongue every 5 (five) minutes as needed.  ?  Dispense:  25 tablet  ?  Refill:  3  ? ? ?Patient Instructions  ?Medication Instructions:  ?Take Nitrostat 0.4 mg SL tablets as directed for chest pain. ? ?Labwork: ?BNP ?BMP ? ?Follow-Up: ?Follow up with Dr. Gasper Sells or APP in the spring.  ? ?Any Other Special Instructions Will Be Listed Below (If Applicable). ? ?If you need a refill on your cardiac medications before your next appointment, please call your pharmacy. ?  ? ?Signed, ?Werner Lean, MD  ?09/08/2021 2:10 PM    ?[place] ? ?

## 2021-09-08 NOTE — Patient Instructions (Addendum)
Medication Instructions:  ?Take Nitrostat 0.4 mg SL tablets as directed for chest pain. ? ?Labwork: ?BNP ?BMP ? ?Follow-Up: ?Follow up with Dr. Gasper Sells or APP in the spring.  ? ?Any Other Special Instructions Will Be Listed Below (If Applicable). ? ?If you need a refill on your cardiac medications before your next appointment, please call your pharmacy. ? ?

## 2021-09-09 ENCOUNTER — Telehealth: Payer: Self-pay

## 2021-09-09 DIAGNOSIS — R600 Localized edema: Secondary | ICD-10-CM

## 2021-09-09 NOTE — Telephone Encounter (Signed)
-----   Message from Werner Lean, MD sent at 09/09/2021 10:57 AM EST ----- ?Results: ?Increase in creatinine and BNP ?Plan: ?Med change:  ?Valsartan to 160 ?Stop combination pill ?Start lasix 20 mg PO daily ?BNP and BMP in two weeks ?If persistently elevated will need to see nephrology ? ?Werner Lean, MD ? ?

## 2021-09-09 NOTE — Telephone Encounter (Signed)
Left message to return call 

## 2021-09-13 ENCOUNTER — Encounter (HOSPITAL_COMMUNITY): Payer: Medicare Other

## 2021-09-15 ENCOUNTER — Encounter (HOSPITAL_COMMUNITY): Payer: Self-pay

## 2021-09-15 ENCOUNTER — Other Ambulatory Visit: Payer: Self-pay

## 2021-09-15 ENCOUNTER — Encounter (HOSPITAL_COMMUNITY)
Admission: RE | Admit: 2021-09-15 | Discharge: 2021-09-15 | Disposition: A | Discharge status: Home / Self Care | Payer: Medicare Other | Source: Ambulatory Visit | Attending: Internal Medicine | Admitting: Internal Medicine

## 2021-09-15 ENCOUNTER — Telehealth: Payer: Self-pay | Admitting: *Deleted

## 2021-09-15 VITALS — BP 112/80 | HR 64 | Ht 68.0 in | Wt 175.7 lb

## 2021-09-15 DIAGNOSIS — Z79899 Other long term (current) drug therapy: Secondary | ICD-10-CM

## 2021-09-15 DIAGNOSIS — I214 Non-ST elevation (NSTEMI) myocardial infarction: Secondary | ICD-10-CM

## 2021-09-15 DIAGNOSIS — R0602 Shortness of breath: Secondary | ICD-10-CM

## 2021-09-15 MED ORDER — FUROSEMIDE 20 MG PO TABS
20.0000 mg | ORAL_TABLET | Freq: Every day | ORAL | 3 refills | Status: DC
Start: 2021-09-15 — End: 2022-05-11

## 2021-09-15 MED ORDER — VALSARTAN 160 MG PO TABS: 160.0000 mg | ORAL_TABLET | Freq: Every day | ORAL | 3 refills | Status: DC

## 2021-09-15 NOTE — Progress Notes (Signed)
Cardiac Individual Treatment Plan  Patient Details  Name: [name] MRN: [id number] Date of Birth: 25-Dec-1934 Referring Provider:   Flowsheet Row CARDIAC REHAB PHASE II ORIENTATION from 09/15/2021 in [place]  Referring Provider Dr. Gasper Sells       Initial Encounter Date:  Flowsheet Row CARDIAC REHAB PHASE II ORIENTATION from 09/15/2021 in [facility]  Date 09/15/21       Visit Diagnosis: NSTEMI (non-ST elevated myocardial infarction) [facility])  Patient's Home Medications on Admission:  Current Outpatient Medications:    aspirin EC 81 MG tablet, Take 81 mg by mouth daily. Swallow whole., Disp: , Rfl:    atorvastatin (LIPITOR) 80 MG tablet, Take 80 mg by mouth daily., Disp: , Rfl:    furosemide (LASIX) 20 MG tablet, Take 1 tablet (20 mg total) by mouth daily., Disp: 90 tablet, Rfl: 3   glipiZIDE (GLUCOTROL XL) 5 MG 24 hr tablet, Take 5 mg by mouth daily., Disp: , Rfl:    latanoprost (XALATAN) 0.005 % ophthalmic solution, Place 1 drop into both eyes at bedtime., Disp: , Rfl:    loratadine (CLARITIN) 10 MG tablet, Take 10 mg by mouth daily., Disp: , Rfl:    meloxicam (MOBIC) 7.5 MG tablet, Take 7.5 mg by mouth daily., Disp: , Rfl:    metoprolol succinate (TOPROL-XL) 25 MG 24 hr tablet, Take 25 mg by mouth daily., Disp: , Rfl:    montelukast (SINGULAIR) 5 MG chewable tablet, Chew 5 mg by mouth daily., Disp: , Rfl:    nitroGLYCERIN (NITROSTAT) 0.4 MG SL tablet, Place 1 tablet (0.4 mg total) under the tongue every 5 (five) minutes as needed., Disp: 25 tablet, Rfl: 3   pantoprazole (PROTONIX) 40 MG tablet, Take 40 mg by mouth 2 (two) times daily., Disp: , Rfl:    valsartan (DIOVAN) 160 MG tablet, Take 1 tablet (160 mg total) by mouth daily. (Patient taking differently: Take 160 mg by mouth 2 (two) times daily.), Disp: 90 tablet, Rfl: 3   cetirizine (ZYRTEC) 10 MG tablet, Take 1 tablet (10 mg total) by mouth daily. (Patient not taking:  Reported on 09/15/2021), Disp: 30 tablet, Rfl: 11   fluticasone (FLONASE) 50 MCG/ACT nasal spray, Place 1 spray into both nostrils daily. (Patient not taking: Reported on 09/08/2021), Disp: 1 g, Rfl: 11   omeprazole (PRILOSEC) 20 MG capsule, Take 1 capsule (20 mg total) by mouth daily. (Patient not taking: Reported on 09/15/2021), Disp: 30 capsule, Rfl: 4  Past Medical History: Past Medical History:  Diagnosis Date   Cramps, extremity    legs and hands   Diabetes ([place])    type II   Enterococcus UTI 02/03/2016   H/O cardiovascular stress test 03/05/2009   normal, no ischemia   H/O Doppler ultrasound    lower ext arterial doppler 04/13/09-no evidence of arterial insufficiency, normal values; distal aorta 2.4x2.7   HTN (hypertension)    Murmur, cardiac    echo 02/25/13- EF 60-65%, impaired relaxation-grade 1 diastolic dysfunction, mild concentric lvh, mild to moderate aortic regurgitation   Pneumonia    May 2017   Postoperative anemia due to acute blood loss 02/02/2016   Primary localized osteoarthritis of right knee    TIA (transient ischemic attack)    carotid doppler 05/08/11-normal patency    Tobacco Use: Social History   Tobacco Use  Smoking Status Never  Smokeless Tobacco Never    Labs: Recent Review Flowsheet Data     Labs for ITP Cardiac and Pulmonary Rehab Latest  Ref Rng & Units 03/01/2013 11/05/2015 01/22/2016   Cholestrol 0 - 200 mg/dL 165 - -   LDLCALC 0 - 99 mg/dL 79 - -   HDL >39 mg/dL 43 - -   Trlycerides <150 mg/dL 214(H) - -   Hemoglobin A1c 4.8 - 5.6 % 5.8(H) 6.5(H) 5.9(H)       Capillary Blood Glucose: Lab Results  Component Value Date   GLUCAP 116 (H) 02/03/2016   GLUCAP 181 (H) 02/03/2016   GLUCAP 218 (H) 02/02/2016   GLUCAP 119 (H) 02/02/2016   GLUCAP 163 (H) 02/02/2016    POCT Glucose     Row Name 09/15/21 1355             POCT Blood Glucose   Pre-Exercise 178 mg/dL                Exercise Target Goals: Exercise Program  Goal: Individual exercise prescription set using results from initial 6 min walk test and THRR while considering  patients activity barriers and safety.   Exercise Prescription Goal: Starting with aerobic activity 30 plus minutes a day, 3 days per week for initial exercise prescription. Provide home exercise prescription and guidelines that participant acknowledges understanding prior to discharge.  Activity Barriers & Risk Stratification:  Activity Barriers & Cardiac Risk Stratification - 09/15/21 1348       Activity Barriers & Cardiac Risk Stratification   Activity Barriers Arthritis;Right Knee Replacement;Back Problems;Shortness of Breath;Balance Concerns;Assistive Device    Cardiac Risk Stratification High             6 Minute Walk:  6 Minute Walk     Row Name 09/15/21 1450         6 Minute Walk   Phase Initial     Distance 800 feet     Walk Time 6 minutes     # of Rest Breaks 0     MPH 1.52     METS 1.28     RPE 13     VO2 Peak 4.49     Symptoms Yes (comment)     Comments back pain 5/10     Resting HR 64 bpm     Resting BP 112/80     Resting Oxygen Saturation  99 %     Exercise Oxygen Saturation  during 6 min walk 95 %     Max Ex. HR 89 bpm     Max Ex. BP 140/80     2 Minute Post BP 118/80              Oxygen Initial Assessment:   Oxygen Re-Evaluation:   Oxygen Discharge (Final Oxygen Re-Evaluation):   Initial Exercise Prescription:  Initial Exercise Prescription - 09/15/21 1400       Date of Initial Exercise RX and Referring Provider   Date 09/15/21    Referring Provider Dr. Gasper Sells    Expected Discharge Date 12/10/21      NuStep   Level 1    SPM 80    Minutes 22      Arm Ergometer   Level 1    RPM 60    Minutes 17      Prescription Details   Frequency (times per week) 3    Duration Progress to 30 minutes of continuous aerobic without signs/symptoms of physical distress      Intensity   THRR 40-80% of Max Heartrate  54-109    Ratings of Perceived Exertion 11-13    Perceived Dyspnea 0-4  Resistance Training   Training Prescription Yes    Weight 2 lbs    Reps 10-15             Perform Capillary Blood Glucose checks as needed.  Exercise Prescription Changes:   Exercise Comments:   Exercise Goals and Review:   Exercise Goals     Row Name 09/15/21 1453             Exercise Goals   Increase Physical Activity Yes       Intervention Provide advice, education, support and counseling about physical activity/exercise needs.;Develop an individualized exercise prescription for aerobic and resistive training based on initial evaluation findings, risk stratification, comorbidities and participant's personal goals.       Expected Outcomes Short Term: Attend rehab on a regular basis to increase amount of physical activity.;Long Term: Exercising regularly at least 3-5 days a week.;Long Term: Add in home exercise to make exercise part of routine and to increase amount of physical activity.       Increase Strength and Stamina Yes       Intervention Develop an individualized exercise prescription for aerobic and resistive training based on initial evaluation findings, risk stratification, comorbidities and participant's personal goals.;Provide advice, education, support and counseling about physical activity/exercise needs.       Expected Outcomes Short Term: Increase workloads from initial exercise prescription for resistance, speed, and METs.;Short Term: Perform resistance training exercises routinely during rehab and add in resistance training at home;Long Term: Improve cardiorespiratory fitness, muscular endurance and strength as measured by increased METs and functional capacity (6MWT)       Able to understand and use rate of perceived exertion (RPE) scale Yes       Intervention Provide education and explanation on how to use RPE scale       Expected Outcomes Short Term: Able to use RPE daily in  rehab to express subjective intensity level;Long Term:  Able to use RPE to guide intensity level when exercising independently       Knowledge and understanding of Target Heart Rate Range (THRR) Yes       Intervention Provide education and explanation of THRR including how the numbers were predicted and where they are located for reference       Expected Outcomes Short Term: Able to state/look up THRR;Long Term: Able to use THRR to govern intensity when exercising independently;Short Term: Able to use daily as guideline for intensity in rehab       Able to check pulse independently Yes       Intervention Provide education and demonstration on how to check pulse in carotid and radial arteries.;Review the importance of being able to check your own pulse for safety during independent exercise       Expected Outcomes Short Term: Able to explain why pulse checking is important during independent exercise;Long Term: Able to check pulse independently and accurately       Understanding of Exercise Prescription Yes       Intervention Provide education, explanation, and written materials on patient's individual exercise prescription       Expected Outcomes Short Term: Able to explain program exercise prescription;Long Term: Able to explain home exercise prescription to exercise independently                Exercise Goals Re-Evaluation :    Discharge Exercise Prescription (Final Exercise Prescription Changes):   Nutrition:  Target Goals: Understanding of nutrition guidelines, daily intake of sodium '1500mg'$ , cholesterol '200mg'$ ,  calories 30% from fat and 7% or less from saturated fats, daily to have 5 or more servings of fruits and vegetables.  Biometrics:  Pre Biometrics - 09/15/21 1453       Pre Biometrics   Height '5\' 8"'$  (1.727 m)    Weight 175 lb 11.3 oz (79.7 kg)    Waist Circumference 43 inches    Hip Circumference 44 inches    Waist to Hip Ratio 0.98 %    BMI (Calculated) 26.72     Triceps Skinfold 10 mm    % Body Fat 36.2 %    Grip Strength 15.7 kg    Flexibility 0 in    Single Leg Stand 0 seconds              Nutrition Therapy Plan and Nutrition Goals:  Nutrition Therapy & Goals - 09/15/21 1352       Intervention Plan   Intervention Nutrition handout(s) given to patient.    Expected Outcomes Short Term Goal: Understand basic principles of dietary content, such as calories, fat, sodium, cholesterol and nutrients.             Nutrition Assessments:  Nutrition Assessments - 09/15/21 1407       MEDFICTS Scores   Pre Score 24            MEDIFICTS Score Key: ?70 Need to make dietary changes  40-70 Heart Healthy Diet ? 40 Therapeutic Level Cholesterol Diet   Picture Your Plate Scores: <78 Unhealthy dietary pattern with much room for improvement. 41-50 Dietary pattern unlikely to meet recommendations for good health and room for improvement. 51-60 More healthful dietary pattern, with some room for improvement.  >60 Healthy dietary pattern, although there may be some specific behaviors that could be improved.    Nutrition Goals Re-Evaluation:   Nutrition Goals Discharge (Final Nutrition Goals Re-Evaluation):   Psychosocial: Target Goals: Acknowledge presence or absence of significant depression and/or stress, maximize coping skills, provide positive support system. Participant is able to verbalize types and ability to use techniques and skills needed for reducing stress and depression.  Initial Review & Psychosocial Screening:  Initial Psych Review & Screening - 09/15/21 1351       Initial Review   Current issues with None Identified      Family Dynamics   Good Support System? Yes    Comments Her support system includes her sons who live in [place] and [place].      Barriers   Psychosocial barriers to participate in program There are no identifiable barriers or psychosocial needs.      Screening Interventions    Interventions Encouraged to exercise    Expected Outcomes Short Term goal: Identification and review with participant of any Quality of Life or Depression concerns found by scoring the questionnaire.             Quality of Life Scores:  Quality of Life - 09/15/21 1450       Quality of Life   Select Quality of Life      Quality of Life Scores   Health/Function Pre 20.77 %    Socioeconomic Pre 27.75 %    Psych/Spiritual Pre 29.14 %    Family Pre 22.4 %    GLOBAL Pre 24.06 %            Scores of 19 and below usually indicate a poorer quality of life in these areas.  A difference of  2-3 points is a clinically meaningful difference.  A difference of 2-3 points in the total score of the Quality of Life Index has been associated with significant improvement in overall quality of life, self-image, physical symptoms, and general health in studies assessing change in quality of life.  PHQ-9: Recent Review Flowsheet Data     Depression screen [facility] 2/9 09/15/2021   Decreased Interest 0   Down, Depressed, Hopeless 0   PHQ - 2 Score 0   Altered sleeping 1   Tired, decreased energy 1   Change in appetite 0   Feeling bad or failure about yourself  0   Trouble concentrating 0   Moving slowly or fidgety/restless 0   Suicidal thoughts 0   PHQ-9 Score 2   Difficult doing work/chores Somewhat difficult      Interpretation of Total Score  Total Score Depression Severity:  1-4 = Minimal depression, 5-9 = Mild depression, 10-14 = Moderate depression, 15-19 = Moderately severe depression, 20-27 = Severe depression   Psychosocial Evaluation and Intervention:  Psychosocial Evaluation - 09/15/21 1422       Psychosocial Evaluation & Interventions   Interventions Encouraged to exercise with the program and follow exercise prescription    Comments Pt has no barriers to participating in CR. She has no identifiable psychosocial issues. She scored a 2 on her PHQ-9 and this relates to her poor  sleeping patterns and lack of energy that she has at times. She reports that she has a good support system from her two sons. They live in [place] and [place]. She is widowed, but has two step sons who live nearby in [place]. She states that her goals are to get stronger and to be able to do ADL's without SOB. She is eager to start the program.    Expected Outcomes Pt will continue to have no identfiable psychosocial issues.    Continue Psychosocial Services  No Follow up required             Psychosocial Re-Evaluation:   Psychosocial Discharge (Final Psychosocial Re-Evaluation):   Vocational Rehabilitation: Provide vocational rehab assistance to qualifying candidates.   Vocational Rehab Evaluation & Intervention:  Vocational Rehab - 09/15/21 1353       Initial Vocational Rehab Evaluation & Intervention   Assessment shows need for Vocational Rehabilitation No   retired and does not want to go back to work            Education: Education Goals: Education classes will be provided on a weekly basis, covering required topics. Participant will state understanding/return demonstration of topics presented.  Learning Barriers/Preferences:  Learning Barriers/Preferences - 09/15/21 1431       Learning Barriers/Preferences   Learning Barriers None    Learning Preferences Audio;Video;Verbal Instruction;Skilled Demonstration;Individual Instruction             Education Topics: Hypertension, Hypertension Reduction -Define heart disease and high blood pressure. Discus how high blood pressure affects the body and ways to reduce high blood pressure.   Exercise and Your Heart -Discuss why it is important to exercise, the FITT principles of exercise, normal and abnormal responses to exercise, and how to exercise safely.   Angina -Discuss definition of angina, causes of angina, treatment of angina, and how to decrease risk of having angina.   Cardiac  Medications -Review what the following cardiac medications are used for, how they affect the body, and side effects that may occur when taking the medications.  Medications include Aspirin, Beta blockers, calcium channel blockers, ACE Inhibitors, angiotensin receptor blockers,  diuretics, digoxin, and antihyperlipidemics.   Congestive Heart Failure -Discuss the definition of CHF, how to live with CHF, the signs and symptoms of CHF, and how keep track of weight and sodium intake.   Heart Disease and Intimacy -Discus the effect sexual activity has on the heart, how changes occur during intimacy as we age, and safety during sexual activity.   Smoking Cessation / COPD -Discuss different methods to quit smoking, the health benefits of quitting smoking, and the definition of COPD.   Nutrition I: Fats -Discuss the types of cholesterol, what cholesterol does to the heart, and how cholesterol levels can be controlled.   Nutrition II: Labels -Discuss the different components of food labels and how to read food label   Heart Parts/Heart Disease and PAD -Discuss the anatomy of the heart, the pathway of blood circulation through the heart, and these are affected by heart disease.   Stress I: Signs and Symptoms -Discuss the causes of stress, how stress may lead to anxiety and depression, and ways to limit stress.   Stress II: Relaxation -Discuss different types of relaxation techniques to limit stress.   Warning Signs of Stroke / TIA -Discuss definition of a stroke, what the signs and symptoms are of a stroke, and how to identify when someone is having stroke.   Knowledge Questionnaire Score:  Knowledge Questionnaire Score - 09/15/21 1355       Knowledge Questionnaire Score   Pre Score 17/24             Core Components/Risk Factors/Patient Goals at Admission:  Personal Goals and Risk Factors at Admission - 09/15/21 1430       Core Components/Risk Factors/Patient Goals on  Admission   Improve shortness of breath with ADL's Yes    Intervention Provide education, individualized exercise plan and daily activity instruction to help decrease symptoms of SOB with activities of daily living.    Expected Outcomes Short Term: Improve cardiorespiratory fitness to achieve a reduction of symptoms when performing ADLs    Diabetes Yes    Intervention Provide education about signs/symptoms and action to take for hypo/hyperglycemia.;Provide education about proper nutrition, including hydration, and aerobic/resistive exercise prescription along with prescribed medications to achieve blood glucose in normal ranges: Fasting glucose 65-99 mg/dL    Expected Outcomes Short Term: Participant verbalizes understanding of the signs/symptoms and immediate care of hyper/hypoglycemia, proper foot care and importance of medication, aerobic/resistive exercise and nutrition plan for blood glucose control.;Long Term: Attainment of HbA1C < 7%.             Core Components/Risk Factors/Patient Goals Review:    Core Components/Risk Factors/Patient Goals at Discharge (Final Review):    ITP Comments:   Comments: Patient arrived for 1st visit/orientation/education at 1230. Patient was referred to CR by Dr. Gasper Sells due to NSTEMI (I21.4). During orientation advised patient on arrival and appointment times what to wear, what to do before, during and after exercise. Reviewed attendance and class policy.  Pt is scheduled to return Cardiac Rehab on 09/20/2021 at 1445. Pt was advised to come to class 15 minutes before class starts.  Discussed RPE/Dpysnea scales. Patient participated in warm up stretches. Patient was able to complete 6 minute walk test.  Telemetry: NSR. Patient was measured for the equipment. Discussed equipment safety with patient. Took patient pre-anthropometric measurements. Patient finished visit at 1430.

## 2021-09-15 NOTE — Telephone Encounter (Signed)
Pt notified and orders placed  

## 2021-09-15 NOTE — Telephone Encounter (Signed)
-----   Message from Werner Lean, MD sent at 09/09/2021 10:57 AM EST ----- ?Results: ?Increase in creatinine and BNP ?Plan: ?Med change:  ?Valsartan to 160 ?Stop combination pill ?Start lasix 20 mg PO daily ?BNP and BMP in two weeks ?If persistently elevated will need to see nephrology ? ?Werner Lean, MD ? ?

## 2021-09-20 ENCOUNTER — Encounter (HOSPITAL_COMMUNITY)
Admission: RE | Admit: 2021-09-20 | Discharge: 2021-09-20 | Disposition: A | Discharge status: Home / Self Care | Payer: Medicare Other | Source: Ambulatory Visit | Attending: Internal Medicine | Admitting: Internal Medicine

## 2021-09-20 VITALS — Wt 170.0 lb

## 2021-09-20 DIAGNOSIS — I214 Non-ST elevation (NSTEMI) myocardial infarction: Secondary | ICD-10-CM | POA: Diagnosis not present

## 2021-09-20 NOTE — Progress Notes (Signed)
Daily Session Note ? ?Patient Details  ?Name: [name] ?MRN: [id number] ?Date of Birth: 1935-01-22 ?Referring Provider:   ?Flowsheet Row CARDIAC REHAB PHASE II ORIENTATION from 09/15/2021 in [place]  ?Referring Provider Dr. Gasper Sells  ? ?  ? ? ?Encounter Date: 09/20/2021 ? ?Check In: ? Session Check In - 09/20/21 1445   ? ?  ? Check-In  ? Supervising physician immediately available to respond to emergencies [facility] MD immediately available   ? Physician(s) Dr. Johney Frame   ? Location [facility]-Cardiac & Pulmonary Rehab   ? Staff Present Geanie Cooley, RN;Dalton Kris Mouton, MS, ACSM-CEP, Exercise Physiologist;Debra Wynetta Emery, RN, BSN   ? Virtual Visit No   ? Medication changes reported     No   ? Fall or balance concerns reported    Yes   ? Comments Pt has not fallen but reports frequently tripping over things or losing her balance. 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   ? Tobacco Cessation No Change   ? Warm-up and Cool-down Performed as group-led instruction   ? Resistance Training Performed Yes   ? VAD Patient? No   ? PAD/SET Patient? No   ?  ? Pain Assessment  ? Currently in Pain? No/denies   ? Multiple Pain Sites No   ? ?  ?  ? ?  ? ? ?Capillary Blood Glucose: ?No results found for this or any previous visit (from the past 24 hour(s)). ? ? ? ?Social History  ? ?Tobacco Use  ?Smoking Status Never  ?Smokeless Tobacco Never  ? ? ?Goals Met:  ?Exercise tolerated well ?No report of concerns or symptoms today ?Strength training completed today ? ?Goals Unmet:  ?Not Applicable ? ?Comments: check out @ 3:45pm ? ? ?Dr. Carlyle Dolly is Medical Director for [place] ?

## 2021-09-22 ENCOUNTER — Encounter (HOSPITAL_COMMUNITY)
Admission: RE | Admit: 2021-09-22 | Discharge: 2021-09-22 | Disposition: A | Discharge status: Home / Self Care | Payer: Medicare Other | Source: Ambulatory Visit | Attending: Internal Medicine | Admitting: Internal Medicine

## 2021-09-22 DIAGNOSIS — I214 Non-ST elevation (NSTEMI) myocardial infarction: Secondary | ICD-10-CM

## 2021-09-22 NOTE — Progress Notes (Signed)
Daily Session Note ? ?Patient Details  ?Name: [name] ?MRN: [id number] ?Date of Birth: October 09, 1934 ?Referring Provider:   ?Flowsheet Row CARDIAC REHAB PHASE II ORIENTATION from 09/15/2021 in [place]  ?Referring Provider Dr. Gasper Sells  ? ?  ? ? ?Encounter Date: 09/22/2021 ? ?Check In: ? Session Check In - 09/22/21 1445   ? ?  ? Check-In  ? Supervising physician immediately available to respond to emergencies [facility] MD immediately available   ? Physician(s) Dr Harl Bowie   ? Location [facility]-Card[facility] Pul[facility]   ? Staff Present Hoy Register, MS, ACSM-CEP, Exercise Physiologist;Heather Zigmund Daniel, Exercise Physiologist;Latishia Suitt Hassell Done, RN, BSN   ? Virtual Visit No   ? Medication changes reported     No   ? Fall or balance concerns reported    Yes   ? Comments Pt has not fallen but reports frequently tripping over things or losing her balance. 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   ? Tobacco Cessation No Change   ? Warm-up and Cool-down Performed as group-led instruction   ? Resistance Training Performed Yes   ? VAD Patient? No   ? PAD/SET Patient? No   ?  ? Pain Assessment  ? Currently in Pain? No/denies   ? Multiple Pain Sites No   ? ?  ?  ? ?  ? ? ?Capillary Blood Glucose: ?No results found for this or any previous visit (from the past 24 hour(s)). ? ? ? ?Social History  ? ?Tobacco Use  ?Smoking Status Never  ?Smokeless Tobacco Never  ? ? ?Goals Met:  ?Independence with exercise equipment ?Exercise tolerated well ?No report of concerns or symptoms today ?Strength training completed today ? ?Goals Unmet:  ?Not Applicable ? ?Comments: Check out 1545 ? ? ?Dr. Carlyle Dolly is Medical Director for [place] ?

## 2021-09-24 ENCOUNTER — Encounter (HOSPITAL_COMMUNITY)
Admission: RE | Admit: 2021-09-24 | Discharge: 2021-09-24 | Disposition: A | Discharge status: Home / Self Care | Payer: Medicare Other | Source: Ambulatory Visit | Attending: Internal Medicine | Admitting: Internal Medicine

## 2021-09-24 DIAGNOSIS — I214 Non-ST elevation (NSTEMI) myocardial infarction: Secondary | ICD-10-CM | POA: Diagnosis not present

## 2021-09-24 NOTE — Progress Notes (Signed)
Daily Session Note ? ?Patient Details  ?Name: [name] ?MRN: [id number] ?Date of Birth: [birth year] ?Referring Provider:   ?Flowsheet Row CARDIAC REHAB PHASE II ORIENTATION from 09/15/2021 in [place]  ?Referring Provider Dr. Gasper Sells  ? ?  ? ? ?Encounter Date: 09/24/2021 ? ?Check In: ? Session Check In - 09/24/21 1445   ? ?  ? Check-In  ? Supervising physician immediately available to respond to emergencies [facility] MD immediately available   ? Physician(s) Dr Harl Bowie   ? Location [facility]   ? Staff Present Geanie Cooley, RN;Debra Wynetta Emery, RN, Joanette Gula, RN, Madlyn Frankel, RN, BSN   ? Virtual Visit No   ? Medication changes reported     No   ? Fall or balance concerns reported    Yes   ? Comments Pt has not fallen but reports frequently tripping over things or losing her balance. 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   ? Tobacco Cessation No Change   ? Warm-up and Cool-down Performed as group-led instruction   ? Resistance Training Performed Yes   ? VAD Patient? No   ? PAD/SET Patient? No   ?  ? Pain Assessment  ? Currently in Pain? No/denies   ? Multiple Pain Sites No   ? ?  ?  ? ?  ? ? ?Capillary Blood Glucose: ?No results found for this or any previous visit (from the past 24 hour(s)). ? ? ? ?Social History  ? ?Tobacco Use  ?Smoking Status Never  ?Smokeless Tobacco Never  ? ? ?Goals Met:  ?Independence with exercise equipment ?Exercise tolerated well ?No report of concerns or symptoms today ?Strength training completed today ? ?Goals Unmet:  ?Not Applicable ? ?Comments: checkout 1545 ? ? ? ?Dr. Carlyle Dolly is Medical Director for [place] ?

## 2021-09-27 ENCOUNTER — Encounter (HOSPITAL_COMMUNITY)
Admission: RE | Admit: 2021-09-27 | Discharge: 2021-09-27 | Disposition: A | Discharge status: Home / Self Care | Payer: Medicare Other | Source: Ambulatory Visit | Attending: Internal Medicine | Admitting: Internal Medicine

## 2021-09-27 DIAGNOSIS — I214 Non-ST elevation (NSTEMI) myocardial infarction: Secondary | ICD-10-CM | POA: Diagnosis not present

## 2021-09-27 NOTE — Progress Notes (Signed)
Daily Session Note ? ?Patient Details  ?Name: [name] ?MRN: [id number] ?Date of Birth: [birth year] ?Referring Provider:   ?Flowsheet Row CARDIAC REHAB PHASE II ORIENTATION from 09/15/2021 in [place]  ?Referring Provider Dr. Gasper Sells  ? ?  ? ? ?Encounter Date: 09/27/2021 ? ?Check In: ? Session Check In - 09/27/21 1445   ? ?  ? Check-In  ? Supervising physician immediately available to respond to emergencies [facility] MD immediately available   ? Physician(s) Dr Harl Bowie   ? Location [facility]   ? Staff Present Redge Gainer, BS, Exercise Physiologist;Debra Wynetta Emery, RN, Joanette Gula, RN, Madlyn Frankel, RN, BSN   ? Virtual Visit No   ? Medication changes reported     No   ? Comments NTG PRN added, Furosemide 20 mg daily added, valsartan 160 mg bid added   ? Fall or balance concerns reported    Yes   ? Comments Pt has not fallen but reports frequently tripping over things or losing her balance. 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   ? Tobacco Cessation No Change   ? Warm-up and Cool-down Performed as group-led instruction   ? Resistance Training Performed Yes   ? VAD Patient? No   ? PAD/SET Patient? No   ?  ? Pain Assessment  ? Currently in Pain? No/denies   ? Multiple Pain Sites No   ? ?  ?  ? ?  ? ? ?Capillary Blood Glucose: ?No results found for this or any previous visit (from the past 24 hour(s)). ? ? ? ?Social History  ? ?Tobacco Use  ?Smoking Status Never  ?Smokeless Tobacco Never  ? ? ?Goals Met:  ?Independence with exercise equipment ?Exercise tolerated well ?No report of concerns or symptoms today ?Strength training completed today ? ?Goals Unmet:  ?Not Applicable ? ?Comments: Check out 1545. ? ? ?Dr. Carlyle Dolly is Medical Director for [place] ?

## 2021-09-29 ENCOUNTER — Other Ambulatory Visit: Payer: Self-pay

## 2021-09-29 ENCOUNTER — Encounter (HOSPITAL_COMMUNITY)
Admission: RE | Admit: 2021-09-29 | Discharge: 2021-09-29 | Disposition: A | Discharge status: Home / Self Care | Payer: Medicare Other | Source: Ambulatory Visit | Attending: Internal Medicine | Admitting: Internal Medicine

## 2021-09-29 ENCOUNTER — Other Ambulatory Visit (HOSPITAL_COMMUNITY): Payer: Self-pay | Admitting: Internal Medicine

## 2021-09-29 ENCOUNTER — Ambulatory Visit (HOSPITAL_COMMUNITY)
Admission: RE | Admit: 2021-09-29 | Discharge: 2021-09-29 | Disposition: A | Discharge status: Home / Self Care | Payer: Medicare Other | Source: Ambulatory Visit | Attending: Internal Medicine | Admitting: Internal Medicine

## 2021-09-29 DIAGNOSIS — E1165 Type 2 diabetes mellitus with hyperglycemia: Secondary | ICD-10-CM | POA: Diagnosis not present

## 2021-09-29 DIAGNOSIS — R079 Chest pain, unspecified: Secondary | ICD-10-CM | POA: Diagnosis not present

## 2021-09-29 DIAGNOSIS — R059 Cough, unspecified: Secondary | ICD-10-CM | POA: Diagnosis not present

## 2021-09-29 DIAGNOSIS — I214 Non-ST elevation (NSTEMI) myocardial infarction: Secondary | ICD-10-CM | POA: Diagnosis not present

## 2021-09-29 DIAGNOSIS — Z299 Encounter for prophylactic measures, unspecified: Secondary | ICD-10-CM | POA: Diagnosis not present

## 2021-09-29 DIAGNOSIS — N184 Chronic kidney disease, stage 4 (severe): Secondary | ICD-10-CM | POA: Diagnosis not present

## 2021-09-29 DIAGNOSIS — I10 Essential (primary) hypertension: Secondary | ICD-10-CM | POA: Diagnosis not present

## 2021-09-29 DIAGNOSIS — R0602 Shortness of breath: Secondary | ICD-10-CM | POA: Diagnosis not present

## 2021-09-29 DIAGNOSIS — I700 Atherosclerosis of aorta: Secondary | ICD-10-CM | POA: Diagnosis not present

## 2021-10-01 ENCOUNTER — Encounter (HOSPITAL_COMMUNITY)
Admission: RE | Admit: 2021-10-01 | Discharge: 2021-10-01 | Disposition: A | Discharge status: Home / Self Care | Payer: Medicare Other | Source: Ambulatory Visit | Attending: Internal Medicine | Admitting: Internal Medicine

## 2021-10-01 DIAGNOSIS — I214 Non-ST elevation (NSTEMI) myocardial infarction: Secondary | ICD-10-CM

## 2021-10-01 DIAGNOSIS — Z299 Encounter for prophylactic measures, unspecified: Secondary | ICD-10-CM | POA: Diagnosis not present

## 2021-10-01 DIAGNOSIS — S91009A Unspecified open wound, unspecified ankle, initial encounter: Secondary | ICD-10-CM | POA: Diagnosis not present

## 2021-10-01 DIAGNOSIS — G629 Polyneuropathy, unspecified: Secondary | ICD-10-CM | POA: Diagnosis not present

## 2021-10-01 DIAGNOSIS — I10 Essential (primary) hypertension: Secondary | ICD-10-CM | POA: Diagnosis not present

## 2021-10-01 DIAGNOSIS — Z789 Other specified health status: Secondary | ICD-10-CM | POA: Diagnosis not present

## 2021-10-01 NOTE — Progress Notes (Signed)
Daily Session Note ? ?Patient Details  ?Name: [name] ?MRN: [id number] ?Date of Birth: 1934-08-12 ?Referring Provider:   ?Flowsheet Row CARDIAC REHAB PHASE II ORIENTATION from 09/15/2021 in [place]  ?Referring Provider Dr. Gasper Sells  ? ?  ? ? ?Encounter Date: 10/01/2021 ? ?Check In: ? Session Check In - 10/01/21 1445   ? ?  ? Check-In  ? Supervising physician immediately available to respond to emergencies [facility] MD immediately available   ? Physician(s) Dr Gardiner Rhyme   ? Location [facility]   ? Staff Present Redge Gainer, BS, Exercise Physiologist;Debra Wynetta Emery, RN, Joanette Gula, RN, Bjorn Loser, MS, ACSM-CEP, Exercise Physiologist   ? Virtual Visit No   ? Medication changes reported     No   ? Comments NTG PRN added, Furosemide 20 mg daily added, valsartan 160 mg bid added   ? Comments Pt has not fallen but reports frequently tripping over things or losing her balance. 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   ? Tobacco Cessation No Change   ? Warm-up and Cool-down Performed as group-led instruction   ? Resistance Training Performed Yes   ? VAD Patient? No   ? PAD/SET Patient? No   ?  ? Pain Assessment  ? Currently in Pain? No/denies   ? Multiple Pain Sites No   ? ?  ?  ? ?  ? ? ?Capillary Blood Glucose: ?No results found for this or any previous visit (from the past 24 hour(s)). ? ? ? ?Social History  ? ?Tobacco Use  ?Smoking Status Never  ?Smokeless Tobacco Never  ? ? ?Goals Met:  ?Independence with exercise equipment ?Exercise tolerated well ?No report of concerns or symptoms today ?Strength training completed today ? ?Goals Unmet:  ?Not Applicable ? ?Comments: Checkout in at 1545 ? ? ?Dr. Carlyle Dolly is Medical Director for [place] ?

## 2021-10-04 ENCOUNTER — Encounter (HOSPITAL_COMMUNITY)
Admission: RE | Admit: 2021-10-04 | Discharge: 2021-10-04 | Disposition: A | Discharge status: Home / Self Care | Payer: Medicare Other | Source: Ambulatory Visit | Attending: Internal Medicine | Admitting: Internal Medicine

## 2021-10-04 DIAGNOSIS — I214 Non-ST elevation (NSTEMI) myocardial infarction: Secondary | ICD-10-CM

## 2021-10-04 NOTE — Progress Notes (Signed)
Daily Session Note ? ?Patient Details  ?Name: [name] ?MRN: [id number] ?Date of Birth: 1935-04-27 ?Referring Provider:   ?Flowsheet Row CARDIAC REHAB PHASE II ORIENTATION from 09/15/2021 in [place]  ?Referring Provider Dr. Gasper Sells  ? ?  ? ? ?Encounter Date: 10/04/2021 ? ?Check In: ? Session Check In - 10/04/21 1445   ? ?  ? Check-In  ? Supervising physician immediately available to respond to emergencies [facility] MD immediately available   ? Physician(s) Dr Marlou Porch   ? Location [facility]   ? Staff Present Geanie Cooley, RN;Debra Wynetta Emery, RN, Joanette Gula, RN, Madlyn Frankel, RN, BSN;Heather Otho Ket, BS, Exercise Physiologist   ? Virtual Visit No   ? Medication changes reported     No   ? Comments NTG PRN added, Furosemide 20 mg daily added, valsartan 160 mg bid added   ? Comments Pt has not fallen but reports frequently tripping over things or losing her balance. 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   ? Tobacco Cessation No Change   ? Warm-up and Cool-down Performed as group-led instruction   ? Resistance Training Performed Yes   ? VAD Patient? No   ? PAD/SET Patient? No   ?  ? Pain Assessment  ? Currently in Pain? No/denies   ? Multiple Pain Sites No   ? ?  ?  ? ?  ? ? ?Capillary Blood Glucose: ?No results found for this or any previous visit (from the past 24 hour(s)). ? ? ? ?Social History  ? ?Tobacco Use  ?Smoking Status Never  ?Smokeless Tobacco Never  ? ? ?Goals Met:  ?Independence with exercise equipment ?Exercise tolerated well ?No report of concerns or symptoms today ?Strength training completed today ? ?Goals Unmet:  ?Not Applicable ? ?Comments: check out at 1545. ? ? ?Dr. Carlyle Dolly is Medical Director for [place] ?

## 2021-10-06 ENCOUNTER — Encounter (HOSPITAL_COMMUNITY)
Admission: RE | Admit: 2021-10-06 | Discharge: 2021-10-06 | Disposition: A | Discharge status: Home / Self Care | Payer: Medicare Other | Source: Ambulatory Visit | Attending: Internal Medicine | Admitting: Internal Medicine

## 2021-10-06 DIAGNOSIS — I214 Non-ST elevation (NSTEMI) myocardial infarction: Secondary | ICD-10-CM | POA: Diagnosis not present

## 2021-10-06 NOTE — Progress Notes (Signed)
Cardiac Individual Treatment Plan ? ?Patient Details  ?Name: [name] ?MRN: [id number] ?Date of Birth: 28-Jul-1934 ?Referring Provider:   ?Flowsheet Row CARDIAC REHAB PHASE II ORIENTATION from 09/15/2021 in [place]  ?Referring Provider Dr. Gasper Sells  ? ?  ? ? ?Initial Encounter Date:  ?Flowsheet Row CARDIAC REHAB PHASE II ORIENTATION from 09/15/2021 in [place]  ?Date 09/15/21  ? ?  ? ? ?Visit Diagnosis: NSTEMI (non-ST elevated myocardial infarction) ([place]) ? ?Patient's Home Medications on Admission: ? ?Current Outpatient Medications:  ?  aspirin EC 81 MG tablet, Take 81 mg by mouth daily. Swallow whole., Disp: , Rfl:  ?  atorvastatin (LIPITOR) 80 MG tablet, Take 80 mg by mouth daily., Disp: , Rfl:  ?  cetirizine (ZYRTEC) 10 MG tablet, Take 1 tablet (10 mg total) by mouth daily. (Patient not taking: Reported on 09/15/2021), Disp: 30 tablet, Rfl: 11 ?  fluticasone (FLONASE) 50 MCG/ACT nasal spray, Place 1 spray into both nostrils daily. (Patient not taking: Reported on 09/08/2021), Disp: 1 g, Rfl: 11 ?  furosemide (LASIX) 20 MG tablet, Take 1 tablet (20 mg total) by mouth daily., Disp: 90 tablet, Rfl: 3 ?  glipiZIDE (GLUCOTROL XL) 5 MG 24 hr tablet, Take 5 mg by mouth daily., Disp: , Rfl:  ?  latanoprost (XALATAN) 0.005 % ophthalmic solution, Place 1 drop into both eyes at bedtime., Disp: , Rfl:  ?  loratadine (CLARITIN) 10 MG tablet, Take 10 mg by mouth daily., Disp: , Rfl:  ?  meloxicam (MOBIC) 7.5 MG tablet, Take 7.5 mg by mouth daily., Disp: , Rfl:  ?  metoprolol succinate (TOPROL-XL) 25 MG 24 hr tablet, Take 25 mg by mouth daily., Disp: , Rfl:  ?  montelukast (SINGULAIR) 5 MG chewable tablet, Chew 5 mg by mouth daily., Disp: , Rfl:  ?  nitroGLYCERIN (NITROSTAT) 0.4 MG SL tablet, Place 1 tablet (0.4 mg total) under the tongue every 5 (five) minutes as needed., Disp: 25 tablet, Rfl: 3 ?  omeprazole (PRILOSEC) 20 MG capsule, Take 1 capsule (20 mg total) by mouth  daily. (Patient not taking: Reported on 09/15/2021), Disp: 30 capsule, Rfl: 4 ?  pantoprazole (PROTONIX) 40 MG tablet, Take 40 mg by mouth 2 (two) times daily., Disp: , Rfl:  ?  valsartan (DIOVAN) 160 MG tablet, Take 1 tablet (160 mg total) by mouth daily. (Patient taking differently: Take 160 mg by mouth 2 (two) times daily.), Disp: 90 tablet, Rfl: 3 ? ?Past Medical History: ?Past Medical History:  ?Diagnosis Date  ? Cramps, extremity   ? legs and hands  ? Diabetes ([place])   ? type II  ? Enterococcus UTI 02/03/2016  ? H/O cardiovascular stress test 03/05/2009  ? normal, no ischemia  ? H/O Doppler ultrasound   ? lower ext arterial doppler 04/13/09-no evidence of arterial insufficiency, normal values; distal aorta 2.4x2.7  ? HTN (hypertension)   ? Murmur, cardiac   ? echo 02/25/13- EF 60-65%, impaired relaxation-grade 1 diastolic dysfunction, mild concentric lvh, mild to moderate aortic regurgitation  ? Pneumonia   ? May 2017  ? Postoperative anemia due to acute blood loss 02/02/2016  ? Primary localized osteoarthritis of right knee   ? TIA (transient ischemic attack)   ? carotid doppler 05/08/11-normal patency  ? ? ?Tobacco Use: ?Social History  ? ?Tobacco Use  ?Smoking Status Never  ?Smokeless Tobacco Never  ? ? ?Labs: ?Review Flowsheet   ? ?  ?  Latest Ref Rng & Units 03/01/2013 11/05/2015  01/22/2016  ?Labs for [facility]P Cardiac and Pulmonary Rehab  ?Cholestrol 0 - 200 mg/dL 165      ?LDL (calc) 0 - 99 mg/dL 79      ?HDL-C >39 mg/dL 43      ?Trlycerides <150 mg/dL 214      ?Hemoglobin A1c 4.8 - 5.6 % 5.8   6.5   5.9    ?  ? ? Multiple values from one day are sorted in reverse-chronological order  ?  ?  ? ? ?Capillary Blood Glucose: ?Lab Results  ?Component Value Date  ? GLUCAP 116 (H) 02/03/2016  ? GLUCAP 181 (H) 02/03/2016  ? GLUCAP 218 (H) 02/02/2016  ? GLUCAP 119 (H) 02/02/2016  ? GLUCAP 163 (H) 02/02/2016  ? ? POCT Glucose   ? ? [place] Name 09/15/21 1355  ?  ?  ?  ?  ?  ? POCT Blood Glucose  ? Pre-Exercise 178 mg/dL      ? ?  ?   ? ?  ? ? ?Exercise Target Goals: ?Exercise Program Goal: ?Individual exercise prescription set using results from initial 6 min walk test and THRR while considering  patient?s activity barriers and safety.  ? ?Exercise Prescription Goal: ?Starting with aerobic activity 30 plus minutes a day, 3 days per week for initial exercise prescription. Provide home exercise prescription and guidelines that participant acknowledges understanding prior to discharge. ? ?Activity Barriers & Risk Stratification: ? Activity Barriers & Cardiac Risk Stratification - 09/15/21 1348   ? ?  ? Activity Barriers & Cardiac Risk Stratification  ? Activity Barriers Arthritis;Right Knee Replacement;Back Problems;Shortness of Breath;Balance Concerns;Assistive Device   ? Cardiac Risk Stratification High   ? ?  ?  ? ?  ? ? ?6 Minute Walk: ? 6 Minute Walk   ? ? [place] Name 09/15/21 1450  ?  ?  ?  ? 6 Minute Walk  ? Phase Initial    ? Distance 800 feet    ? Walk Time 6 minutes    ? # of Rest Breaks 0    ? MPH 1.52    ? METS 1.28    ? RPE 13    ? VO2 Peak 4.49    ? Symptoms Yes (comment)    ? Comments back pain 5/10    ? Resting HR 64 bpm    ? Resting BP 112/80    ? Resting Oxygen Saturation  99 %    ? Exercise Oxygen Saturation  during 6 min walk 95 %    ? Max Ex. HR 89 bpm    ? Max Ex. BP 140/80    ? 2 Minute Post BP 118/80    ? ?  ?  ? ?  ? ? ?Oxygen Initial Assessment: ? ? ?Oxygen Re-Evaluation: ? ? ?Oxygen Discharge (Final Oxygen Re-Evaluation): ? ? ?Initial Exercise Prescription: ? Initial Exercise Prescription - 09/15/21 1400   ? ?  ? Date of Initial Exercise RX and Referring Provider  ? Date 09/15/21   ? Referring Provider Dr. Gasper Sells   ? Expected Discharge Date 12/10/21   ?  ? NuStep  ? Level 1   ? SPM 80   ? Minutes 22   ?  ? Arm Ergometer  ? Level 1   ? RPM 60   ? Minutes 17   ?  ? Prescription Details  ? Frequency (times per week) 3   ? Duration Progress to 30 minutes of continuous aerobic without signs/symptoms of physical distress    ?  ?  Intensity  ? THRR 40-80% of Max Heartrate 54-109   ? Ratings of Perceived Exertion 11-13   ? Perceived Dyspnea 0-4   ?  ? Resistance Training  ? Training Prescription Yes   ? Weight 2 lbs   ? Reps 10-15   ? ?  ?  ? ?  ? ? ?Perform Capillary Blood Glucose checks as needed. ? ?Exercise Prescription Changes: ? ? Exercise Prescription Changes   ? ? [place] Name 09/20/21 1551 10/04/21 1550  ?  ?  ?  ?  ? Response to Exercise  ? Blood Pressure (Admit) 132/80 132/62     ? Blood Pressure (Exercise) 146/90 126/76     ? Blood Pressure (Exit) 123/70 108/70     ? Heart Rate (Admit) 67 bpm 82 bpm     ? Heart Rate (Exercise) 96 bpm 97 bpm     ? Heart Rate (Exit) 76 bpm 91 bpm     ? Rating of Perceived Exertion (Exercise) 13 12     ? Duration Continue with 30 min of aerobic exercise without signs/symptoms of physical distress. Continue with 30 min of aerobic exercise without signs/symptoms of physical distress.     ? Intensity THRR unchanged THRR unchanged     ?  ? Progression  ? Progression Continue to progress workloads to maintain intensity without signs/symptoms of physical distress. Continue to progress workloads to maintain intensity without signs/symptoms of physical distress.     ?  ? Resistance Training  ? Training Prescription Yes Yes     ? Weight 2 2     ? Reps 10-15 10-15     ? Time 10 Minutes 10 Minutes     ?  ? NuStep  ? Level 1 1     ? SPM 86 99     ? Minutes 17 17     ? METs 2.02 2.29     ?  ? Arm Ergometer  ? Level 1 2     ? RPM 53 69     ? Minutes 22 22     ? METs 1.75 2.63     ? ?  ?  ? ?  ? ? ?Exercise Comments: ? ? ?Exercise Goals and Review: ? ? Exercise Goals   ? ? [place] Name 09/15/21 1453 10/04/21 1552  ?  ?  ?  ?  ? Exercise Goals  ? Increase Physical Activity Yes Yes     ? Intervention Provide advice, education, support and counseling about physical activity/exercise needs.;Develop an individualized exercise prescription for aerobic and resistive training based on initial evaluation findings, risk  stratification, comorbidities and participant's personal goals. Provide advice, education, support and counseling about physical activity/exercise needs.;Develop an individualized exercise prescription for ae

## 2021-10-06 NOTE — Progress Notes (Signed)
Daily Session Note ? ?Patient Details  ?Name: [name] ?MRN: [id number] ?Date of Birth: April 02, 1935 ?Referring Provider:   ?Flowsheet Row CARDIAC REHAB PHASE II ORIENTATION from 09/15/2021 in [place]  ?Referring Provider Dr. Gasper Sells  ? ?  ? ? ?Encounter Date: 10/06/2021 ? ?Check In: ? Session Check In - 10/06/21 1445   ? ?  ? Check-In  ? Supervising physician immediately available to respond to emergencies [facility] MD immediately available   ? Physician(s) Dr Harl Bowie   ? Location [facility]   ? Staff Present Redge Gainer, BS, Exercise Physiologist;Debra Wynetta Emery, RN, Joanette Gula, RN, BSN   ? Virtual Visit No   ? Medication changes reported     No   ? Comments NTG PRN added, Furosemide 20 mg daily added, valsartan 160 mg bid added   ? Fall or balance concerns reported    Yes   ? Comments Pt has not fallen but reports frequently tripping over things or losing her balance. 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   ? Tobacco Cessation No Change   ? Warm-up and Cool-down Performed as group-led instruction   ? Resistance Training Performed Yes   ? VAD Patient? No   ? PAD/SET Patient? No   ?  ? Pain Assessment  ? Currently in Pain? No/denies   ? Multiple Pain Sites No   ? ?  ?  ? ?  ? ? ?Capillary Blood Glucose: ?No results found for this or any previous visit (from the past 24 hour(s)). ? ? ? ?Social History  ? ?Tobacco Use  ?Smoking Status Never  ?Smokeless Tobacco Never  ? ? ?Goals Met:  ?Independence with exercise equipment ?Exercise tolerated well ?No report of concerns or symptoms today ?Strength training completed today ? ?Goals Unmet:  ?Not Applicable ? ?Comments: Check out 1545. ? ? ?Dr. Carlyle Dolly is Medical Director for [place] ?

## 2021-10-07 DIAGNOSIS — I10 Essential (primary) hypertension: Secondary | ICD-10-CM | POA: Diagnosis not present

## 2021-10-07 DIAGNOSIS — E7800 Pure hypercholesterolemia, unspecified: Secondary | ICD-10-CM | POA: Diagnosis not present

## 2021-10-07 DIAGNOSIS — M1710 Unilateral primary osteoarthritis, unspecified knee: Secondary | ICD-10-CM | POA: Diagnosis not present

## 2021-10-07 DIAGNOSIS — E1165 Type 2 diabetes mellitus with hyperglycemia: Secondary | ICD-10-CM | POA: Diagnosis not present

## 2021-10-08 ENCOUNTER — Encounter (HOSPITAL_COMMUNITY)
Admission: RE | Admit: 2021-10-08 | Discharge: 2021-10-08 | Disposition: A | Discharge status: Home / Self Care | Payer: Medicare Other | Source: Ambulatory Visit | Attending: Internal Medicine | Admitting: Internal Medicine

## 2021-10-08 DIAGNOSIS — I214 Non-ST elevation (NSTEMI) myocardial infarction: Secondary | ICD-10-CM

## 2021-10-11 ENCOUNTER — Encounter (HOSPITAL_COMMUNITY)
Admission: RE | Admit: 2021-10-11 | Discharge: 2021-10-11 | Disposition: A | Discharge status: Home / Self Care | Payer: Medicare Other | Source: Ambulatory Visit | Attending: Internal Medicine | Admitting: Internal Medicine

## 2021-10-11 DIAGNOSIS — I214 Non-ST elevation (NSTEMI) myocardial infarction: Secondary | ICD-10-CM | POA: Insufficient documentation

## 2021-10-11 NOTE — Progress Notes (Signed)
Daily Session Note ? ?Patient Details  ?Name: [name] ?MRN: [id number] ?Date of Birth: 01/27/1935 ?Referring Provider:   ?Flowsheet Row CARDIAC REHAB PHASE II ORIENTATION from 09/15/2021 in [place] [facility]  ?Referring Provider Dr. Chandrasekhar  ? ?  ? ? ?Encounter Date: 10/11/2021 ? ?Check In: ? Session Check In - 10/11/21 1500   ? ?  ? Check-In  ? Supervising physician immediately available to respond to emergencies [facility] MD immediately available   ? Physician(s) Dr Branch   ? Location [facility]   ? Staff Present Phyllis Billingsley, RN;Heather Jachimiak, BS, Exercise Physiologist;Dalton Fletcher, MS, ACSM-CEP, Exercise Physiologist   ? Virtual Visit No   ? Medication changes reported     No   ? Fall or balance concerns reported    Yes   ? Comments Pt has not fallen but reports frequently tripping over things or losing her balance. 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   ? Tobacco Cessation No Change   ? Warm-up and Cool-down Performed as group-led instruction   ? Resistance Training Performed Yes   ? VAD Patient? No   ? PAD/SET Patient? No   ?  ? Pain Assessment  ? Currently in Pain? No/denies   ? Multiple Pain Sites No   ? ?  ?  ? ?  ? ? ?Capillary Blood Glucose: ?No results found for this or any previous visit (from the past 24 hour(s)). ? ? ? ?Social History  ? ?Tobacco Use  ?Smoking Status Never  ?Smokeless Tobacco Never  ? ? ?Goals Met:  ?Independence with exercise equipment ?Exercise tolerated well ?No report of concerns or symptoms today ?Strength training completed today ? ?Goals Unmet:  ?Not Applicable ? ?Comments: check out @ 3:45pm ? ? ?Dr. Jonathan Branch is Medical Director for [place] [facility] ?

## 2021-10-13 ENCOUNTER — Encounter (HOSPITAL_COMMUNITY)
Admission: RE | Admit: 2021-10-13 | Discharge: 2021-10-13 | Disposition: A | Discharge status: Home / Self Care | Payer: Medicare Other | Source: Ambulatory Visit | Attending: Internal Medicine | Admitting: Internal Medicine

## 2021-10-13 DIAGNOSIS — I214 Non-ST elevation (NSTEMI) myocardial infarction: Secondary | ICD-10-CM

## 2021-10-13 NOTE — Progress Notes (Signed)
Daily Session Note ? ?Patient Details  ?Name: [name] ?MRN: [id number] ?Date of Birth: [birth year] ?Referring Provider:   ?Flowsheet Row CARDIAC REHAB PHASE II ORIENTATION from 09/15/2021 in [place]  ?Referring Provider Dr. Gasper Sells  ? ?  ? ? ?Encounter Date: 10/13/2021 ? ?Check In: ? Session Check In - 10/13/21 1445   ? ?  ? Check-In  ? Supervising physician immediately available to respond to emergencies [facility] MD immediately available   ? Physician(s) Dr Harl Bowie   ? Location [facility]   ? Staff Present Geanie Cooley, RN;Lynel Forester Hassell Done, RN, Bjorn Loser, MS, ACSM-CEP, Exercise Physiologist;Heather Zigmund Daniel, Exercise Physiologist   ? Virtual Visit No   ? Medication changes reported     No   ? Comments NTG PRN added, Furosemide 20 mg daily added, valsartan 160 mg bid added   ? Fall or balance concerns reported    Yes   ? Comments Pt has not fallen but reports frequently tripping over things or losing her balance. 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   ? Tobacco Cessation No Change   ? Warm-up and Cool-down Performed as group-led instruction   ? Resistance Training Performed Yes   ? VAD Patient? No   ? PAD/SET Patient? No   ?  ? Pain Assessment  ? Currently in Pain? No/denies   ? Multiple Pain Sites No   ? ?  ?  ? ?  ? ? ?Capillary Blood Glucose: ?No results found for this or any previous visit (from the past 24 hour(s)). ? ? ? ?Social History  ? ?Tobacco Use  ?Smoking Status Never  ?Smokeless Tobacco Never  ? ? ?Goals Met:  ?Independence with exercise equipment ?Exercise tolerated well ?No report of concerns or symptoms today ?Strength training completed today ? ?Goals Unmet:  ?Not Applicable ? ?Comments: Check out at 1545. ? ? ?Dr. Carlyle Dolly is Medical Director for [place] ?

## 2021-10-15 ENCOUNTER — Encounter (HOSPITAL_COMMUNITY)
Admission: RE | Admit: 2021-10-15 | Discharge: 2021-10-15 | Disposition: A | Discharge status: Home / Self Care | Payer: Medicare Other | Source: Ambulatory Visit | Attending: Internal Medicine | Admitting: Internal Medicine

## 2021-10-15 DIAGNOSIS — I214 Non-ST elevation (NSTEMI) myocardial infarction: Secondary | ICD-10-CM

## 2021-10-15 NOTE — Progress Notes (Signed)
Daily Session Note ? ?Patient Details  ?Name: [name] ?MRN: [id number] ?Date of Birth: Feb 22, 1935 ?Referring Provider:   ?Flowsheet Row CARDIAC REHAB PHASE II ORIENTATION from 09/15/2021 in [place]  ?Referring Provider Dr. Gasper Sells  ? ?  ? ? ?Encounter Date: 10/15/2021 ? ?Check In: ? Session Check In - 10/15/21 1445   ? ?  ? Check-In  ? Supervising physician immediately available to respond to emergencies [facility] MD immediately available   ? Physician(s) Dr Harrington Challenger   ? Location [facility]   ? Staff Present Geanie Cooley, RN;Heather Otho Ket, BS, Exercise Physiologist;Gwendy Boeder Hassell Done, RN, BSN   ? Virtual Visit No   ? Medication changes reported     No   ? Comments NTG PRN added, Furosemide 20 mg daily added, valsartan 160 mg bid added   ? Fall or balance concerns reported    Yes   ? Comments Pt has not fallen but reports frequently tripping over things or losing her balance. 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   ? Tobacco Cessation No Change   ? Warm-up and Cool-down Performed as group-led instruction   ? Resistance Training Performed Yes   ? VAD Patient? No   ? PAD/SET Patient? No   ?  ? Pain Assessment  ? Currently in Pain? No/denies   ? Multiple Pain Sites No   ? ?  ?  ? ?  ? ? ?Capillary Blood Glucose: ?No results found for this or any previous visit (from the past 24 hour(s)). ? ? ? ?Social History  ? ?Tobacco Use  ?Smoking Status Never  ?Smokeless Tobacco Never  ? ? ?Goals Met:  ?Independence with exercise equipment ?Exercise tolerated well ?No report of concerns or symptoms today ?Strength training completed today ? ?Goals Unmet:  ?Not Applicable ? ?Comments: Check out at 1545. ? ? ?Dr. Carlyle Dolly is Medical Director for [place] ?

## 2021-10-18 ENCOUNTER — Encounter (HOSPITAL_COMMUNITY)
Admission: RE | Admit: 2021-10-18 | Discharge: 2021-10-18 | Disposition: A | Discharge status: Home / Self Care | Payer: Medicare Other | Source: Ambulatory Visit | Attending: Internal Medicine | Admitting: Internal Medicine

## 2021-10-18 VITALS — Wt 172.8 lb

## 2021-10-18 DIAGNOSIS — I214 Non-ST elevation (NSTEMI) myocardial infarction: Secondary | ICD-10-CM | POA: Diagnosis not present

## 2021-10-18 NOTE — Progress Notes (Signed)
Daily Session Note ? ?Patient Details  ?Name: [name] ?MRN: [id number] ?Date of Birth: Jan 23, 1935 ?Referring Provider:   ?Flowsheet Row CARDIAC REHAB PHASE II ORIENTATION from 09/15/2021 in [place]  ?Referring Provider Dr. Gasper Sells  ? ?  ? ? ?Encounter Date: 10/18/2021 ? ?Check In: ? Session Check In - 10/18/21 1445   ? ?  ? Check-In  ? Supervising physician immediately available to respond to emergencies [facility] MD immediately available   ? Physician(s) Dr Johnsie Cancel   ? Location [facility]   ? Staff Present Geanie Cooley, RN;Heather Otho Ket, BS, Exercise Physiologist;Dalton Kris Mouton, MS, ACSM-CEP, Exercise Physiologist;Raylynn Hersh Hassell Done, RN, BSN   ? Virtual Visit No   ? Medication changes reported     No   ? Comments NTG PRN added, Furosemide 20 mg daily added, valsartan 160 mg bid added   ? Tobacco Cessation No Change   ? Warm-up and Cool-down Performed as group-led instruction   ? Resistance Training Performed Yes   ? VAD Patient? No   ? PAD/SET Patient? No   ?  ? Pain Assessment  ? Currently in Pain? No/denies   ? Multiple Pain Sites No   ? ?  ?  ? ?  ? ? ?Capillary Blood Glucose: ?No results found for this or any previous visit (from the past 24 hour(s)). ? ? ? ?Social History  ? ?Tobacco Use  ?Smoking Status Never  ?Smokeless Tobacco Never  ? ? ?Goals Met:  ?Independence with exercise equipment ?Exercise tolerated well ?No report of concerns or symptoms today ?Strength training completed today ? ?Goals Unmet:  ?Not Applicable ? ?Comments: Checkout at 61. ? ? ?Dr. Carlyle Dolly is Medical Director for [place] ?

## 2021-10-20 ENCOUNTER — Encounter (HOSPITAL_COMMUNITY)
Admission: RE | Admit: 2021-10-20 | Discharge: 2021-10-20 | Disposition: A | Discharge status: Home / Self Care | Payer: Medicare Other | Source: Ambulatory Visit | Attending: Internal Medicine | Admitting: Internal Medicine

## 2021-10-20 ENCOUNTER — Encounter (HOSPITAL_COMMUNITY): Payer: Medicare Other

## 2021-10-20 DIAGNOSIS — I214 Non-ST elevation (NSTEMI) myocardial infarction: Secondary | ICD-10-CM | POA: Diagnosis not present

## 2021-10-20 NOTE — Progress Notes (Signed)
Daily Session Note ? ?Patient Details  ?Name: [name] ?MRN: [id number] ?Date of Birth: 1935-05-06 ?Referring Provider:   ?Flowsheet Row CARDIAC REHAB PHASE II ORIENTATION from 09/15/2021 in [place]  ?Referring Provider Dr. Gasper Sells  ? ?  ? ? ?Encounter Date: 10/20/2021 ? ?Check In: ? Session Check In - 10/20/21 1445   ? ?  ? Check-In  ? Supervising physician immediately available to respond to emergencies [facility] MD immediately available   ? Physician(s) Dr Gasper Sells   ? Location [facility]   ? Staff Present Geanie Cooley, RN;Heather Otho Ket, BS, Exercise Physiologist;Ina Scrivens Hassell Done, RN, Bjorn Loser, MS, ACSM-CEP, Exercise Physiologist   ? Virtual Visit No   ? Medication changes reported     No   ? Comments NTG PRN added, Furosemide 20 mg daily added, valsartan 160 mg bid added   ? Fall or balance concerns reported    Yes   ? Comments Pt has not fallen but reports frequently tripping over things or losing her balance. 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   ? Tobacco Cessation No Change   ? Warm-up and Cool-down Performed as group-led instruction   ? Resistance Training Performed Yes   ? VAD Patient? No   ? PAD/SET Patient? No   ?  ? Pain Assessment  ? Currently in Pain? No/denies   ? Multiple Pain Sites No   ? ?  ?  ? ?  ? ? ?Capillary Blood Glucose: ?No results found for this or any previous visit (from the past 24 hour(s)). ? ? ? ?Social History  ? ?Tobacco Use  ?Smoking Status Never  ?Smokeless Tobacco Never  ? ? ?Goals Met:  ?Independence with exercise equipment ?Changing diet to healthy choices, watching portion sizes ?No report of concerns or symptoms today ?Strength training completed today ? ?Goals Unmet:  ?Not Applicable ? ?Comments: Check out 1545. ? ? ?Dr. Carlyle Dolly is Medical Director for [place] ?

## 2021-10-22 ENCOUNTER — Encounter (HOSPITAL_COMMUNITY)
Admission: RE | Admit: 2021-10-22 | Discharge: 2021-10-22 | Disposition: A | Discharge status: Home / Self Care | Payer: Medicare Other | Source: Ambulatory Visit | Attending: Internal Medicine | Admitting: Internal Medicine

## 2021-10-22 DIAGNOSIS — I214 Non-ST elevation (NSTEMI) myocardial infarction: Secondary | ICD-10-CM | POA: Diagnosis not present

## 2021-10-22 NOTE — Progress Notes (Signed)
Daily Session Note ? ?Patient Details  ?Name: [name] ?MRN: [id number] ?Date of Birth: 08-06-1934 ?Referring Provider:   ?Flowsheet Row CARDIAC REHAB PHASE II ORIENTATION from 09/15/2021 in [place]  ?Referring Provider Dr. Gasper Sells  ? ?  ? ? ?Encounter Date: 10/22/2021 ? ?Check In: ? Session Check In - 10/22/21 1445   ? ?  ? Check-In  ? Supervising physician immediately available to respond to emergencies [facility] MD immediately available   ? Physician(s) Nishan   ? Location [facility]   ? Staff Present Redge Gainer, BS, Exercise Physiologist;Nikyla Navedo Wynetta Emery, RN, Bjorn Loser, MS, ACSM-CEP, Exercise Physiologist   ? Virtual Visit No   ? Medication changes reported     No   ? Fall or balance concerns reported    Yes   ? Comments Pt has not fallen but reports frequently tripping over things or losing her balance. 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   ? Tobacco Cessation No Change   ? Warm-up and Cool-down Performed as group-led instruction   ? Resistance Training Performed Yes   ? VAD Patient? No   ? PAD/SET Patient? No   ?  ? Pain Assessment  ? Currently in Pain? No/denies   ? Multiple Pain Sites No   ? ?  ?  ? ?  ? ? ?Capillary Blood Glucose: ?No results found for this or any previous visit (from the past 24 hour(s)). ? ? ? ?Social History  ? ?Tobacco Use  ?Smoking Status Never  ?Smokeless Tobacco Never  ? ? ?Goals Met:  ?Independence with exercise equipment ?Exercise tolerated well ?No report of concerns or symptoms today ?Strength training completed today ? ?Goals Unmet:  ?Not Applicable ? ?Comments: Check out 1545. ? ? ?Dr. Carlyle Dolly is Medical Director for [place] ?

## 2021-10-25 ENCOUNTER — Encounter (HOSPITAL_COMMUNITY)
Admission: RE | Admit: 2021-10-25 | Discharge: 2021-10-25 | Disposition: A | Discharge status: Home / Self Care | Payer: Medicare Other | Source: Ambulatory Visit | Attending: Internal Medicine | Admitting: Internal Medicine

## 2021-10-25 DIAGNOSIS — I214 Non-ST elevation (NSTEMI) myocardial infarction: Secondary | ICD-10-CM

## 2021-10-25 NOTE — Progress Notes (Signed)
Daily Session Note ? ?Patient Details  ?Name: [name] ?MRN: [id number] ?Date of Birth: 18-Sep-1934 ?Referring Provider:   ?Flowsheet Row CARDIAC REHAB PHASE II ORIENTATION from 09/15/2021 in [place]  ?Referring Provider Dr. Gasper Sells  ? ?  ? ? ?Encounter Date: 10/25/2021 ? ?Check In: ? Session Check In - 10/25/21 1441   ? ?  ? Check-In  ? Supervising physician immediately available to respond to emergencies [facility] MD immediately available   ? Physician(s) Dr Johnsie Cancel   ? Location [facility]   ? Staff Present Geanie Cooley, RN;Quintessa Simmerman Hassell Done, RN, Bjorn Loser, MS, ACSM-CEP, Exercise Physiologist;Heather Zigmund Daniel, Exercise Physiologist   ? Virtual Visit No   ? Medication changes reported     No   ? Comments NTG PRN added, Furosemide 20 mg daily added, valsartan 160 mg bid added   ? Fall or balance concerns reported    Yes   ? Comments Pt has not fallen but reports frequently tripping over things or losing her balance. 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   ? Tobacco Cessation No Change   ? Warm-up and Cool-down Performed as group-led instruction   ? Resistance Training Performed Yes   ? VAD Patient? No   ?  ? Pain Assessment  ? Currently in Pain? No/denies   ? Multiple Pain Sites No   ? ?  ?  ? ?  ? ? ?Capillary Blood Glucose: ?No results found for this or any previous visit (from the past 24 hour(s)). ? ? ? ?Social History  ? ?Tobacco Use  ?Smoking Status Never  ?Smokeless Tobacco Never  ? ? ?Goals Met:  ?Independence with exercise equipment ?Exercise tolerated well ?No report of concerns or symptoms today ?Strength training completed today ? ?Goals Unmet:  ?Not Applicable ? ?Comments:  ?Check out at 1545. ? ? ?Dr. Carlyle Dolly is Medical Director for [place] ?

## 2021-10-27 ENCOUNTER — Encounter (HOSPITAL_COMMUNITY)
Admission: RE | Admit: 2021-10-27 | Discharge: 2021-10-27 | Disposition: A | Discharge status: Home / Self Care | Payer: Medicare Other | Source: Ambulatory Visit | Attending: Internal Medicine | Admitting: Internal Medicine

## 2021-10-27 DIAGNOSIS — I214 Non-ST elevation (NSTEMI) myocardial infarction: Secondary | ICD-10-CM

## 2021-10-29 ENCOUNTER — Encounter (HOSPITAL_COMMUNITY)
Admission: RE | Admit: 2021-10-29 | Discharge: 2021-10-29 | Disposition: A | Discharge status: Home / Self Care | Payer: Medicare Other | Source: Ambulatory Visit | Attending: Internal Medicine | Admitting: Internal Medicine

## 2021-10-29 DIAGNOSIS — I214 Non-ST elevation (NSTEMI) myocardial infarction: Secondary | ICD-10-CM

## 2021-10-29 NOTE — Progress Notes (Signed)
Daily Session Note ? ?Patient Details  ?Name: [name] ?MRN: [id number] ?Date of Birth: Dec 19, 1934 ?Referring Provider:   ?Flowsheet Row CARDIAC REHAB PHASE II ORIENTATION from 09/15/2021 in [place]  ?Referring Provider Dr. Gasper Sells  ? ?  ? ? ?Encounter Date: 10/29/2021 ? ?Check In: ? Session Check In - 10/29/21 1445   ? ?  ? Check-In  ? Supervising physician immediately available to respond to emergencies [facility] MD immediately available   ? Physician(s) Dr Harrington Challenger   ? Location [facility]   ? Staff Present Aundra Dubin, RN, Joanette Gula, RN, BSN;Christy Edwards, RN, BSN;Heather Otho Ket, BS, Exercise Physiologist   ? Virtual Visit No   ? Medication changes reported     No   ? Comments NTG PRN added, Furosemide 20 mg daily added, valsartan 160 mg bid added   ? Fall or balance concerns reported    Yes   ? Comments Pt has not fallen but reports frequently tripping over things or losing her balance. 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   ? Tobacco Cessation No Change   ? Warm-up and Cool-down Performed as group-led instruction   ? Resistance Training Performed Yes   ? VAD Patient? No   ? PAD/SET Patient? No   ?  ? Pain Assessment  ? Currently in Pain? No/denies   ? Multiple Pain Sites No   ? ?  ?  ? ?  ? ? ?Capillary Blood Glucose: ?No results found for this or any previous visit (from the past 24 hour(s)). ? ? ? ?Social History  ? ?Tobacco Use  ?Smoking Status Never  ?Smokeless Tobacco Never  ? ? ?Goals Met:  ?Independence with exercise equipment ?Personal goals reviewed ?No report of concerns or symptoms today ?Strength training completed today ? ?Goals Unmet:  ?Not Applicable ? ?Comments: Checkout at 35. ? ? ?Dr. Carlyle Dolly is Medical Director for [facility] ?

## 2021-11-01 ENCOUNTER — Encounter (HOSPITAL_COMMUNITY)
Admission: RE | Admit: 2021-11-01 | Discharge: 2021-11-01 | Disposition: A | Discharge status: Home / Self Care | Payer: Medicare Other | Source: Ambulatory Visit | Attending: Internal Medicine | Admitting: Internal Medicine

## 2021-11-01 VITALS — Wt 170.6 lb

## 2021-11-01 DIAGNOSIS — I214 Non-ST elevation (NSTEMI) myocardial infarction: Secondary | ICD-10-CM | POA: Diagnosis not present

## 2021-11-01 NOTE — Progress Notes (Signed)
Daily Session Note ? ?Patient Details  ?Name: [name] ?MRN: [id number] ?Date of Birth: Oct 03, 1934 ?Referring Provider:   ?Flowsheet Row CARDIAC REHAB PHASE II ORIENTATION from 09/15/2021 in [place]  ?Referring Provider Dr. Gasper Sells  ? ?  ? ? ?Encounter Date: 11/01/2021 ? ?Check In: ? Session Check In - 11/01/21 1445   ? ?  ? Check-In  ? Supervising physician immediately available to respond to emergencies [facility] MD immediately available   ? Physician(s) Dr Gasper Sells   ? Location [facility]   ? Staff Present Madelyn Flavors, RN, Bjorn Loser, MS, ACSM-CEP, Exercise Physiologist;Heather Zigmund Daniel, Exercise Physiologist   ? Virtual Visit No   ? Medication changes reported     No   ? Comments NTG PRN added, Furosemide 20 mg daily added, valsartan 160 mg bid added   ? Fall or balance concerns reported    Yes   ? Comments Pt has not fallen but reports frequently tripping over things or losing her balance. 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   ? Tobacco Cessation No Change   ? Warm-up and Cool-down Performed as group-led instruction   ? Resistance Training Performed Yes   ? VAD Patient? No   ? PAD/SET Patient? No   ?  ? Pain Assessment  ? Currently in Pain? No/denies   ? Multiple Pain Sites No   ? ?  ?  ? ?  ? ? ?Capillary Blood Glucose: ?No results found for this or any previous visit (from the past 24 hour(s)). ? ? ? ?Social History  ? ?Tobacco Use  ?Smoking Status Never  ?Smokeless Tobacco Never  ? ? ?Goals Met:  ?Independence with exercise equipment ?Exercise tolerated well ?No report of concerns or symptoms today ?Strength training completed today ? ?Goals Unmet:  ?Not Applicable ? ?Comments: Checkout at 38. ? ? ?Dr. Carlyle Dolly is Medical Director for [place] ?

## 2021-11-03 ENCOUNTER — Encounter (HOSPITAL_COMMUNITY)
Admission: RE | Admit: 2021-11-03 | Discharge: 2021-11-03 | Disposition: A | Discharge status: Home / Self Care | Payer: Medicare Other | Source: Ambulatory Visit | Attending: Internal Medicine | Admitting: Internal Medicine

## 2021-11-03 DIAGNOSIS — I214 Non-ST elevation (NSTEMI) myocardial infarction: Secondary | ICD-10-CM

## 2021-11-03 NOTE — Progress Notes (Signed)
Daily Session Note ? ?Patient Details  ?Name: [name] ?MRN: [id number] ?Date of Birth: 08/06/1934 ?Referring Provider:   ?Flowsheet Row CARDIAC REHAB PHASE II ORIENTATION from 09/15/2021 in [place] [facility]  ?Referring Provider Dr. Chandrasekhar  ? ?  ? ? ?Encounter Date: 11/03/2021 ? ?Check In: ? Session Check In - 11/03/21 1445   ? ?  ? Check-In  ? Supervising physician immediately available to respond to emergencies [facility] MD immediately available   ? Physician(s) Dr McDowell   ? Location [facility]   ? Staff Present Phyllis Billingsley, RN;Debra Johnson, RN, BSN;Daphyne Martin, RN, BSN;Heather Jachimiak, BS, Exercise Physiologist   ? Virtual Visit No   ? Medication changes reported     No   ? Comments NTG PRN added, Furosemide 20 mg daily added, valsartan 160 mg bid added   ? Fall or balance concerns reported    No   ? Comments Pt has not fallen but reports frequently tripping over things or losing her balance. 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   ? Tobacco Cessation No Change   ? Warm-up and Cool-down Performed as group-led instruction   ? Resistance Training Performed Yes   ? VAD Patient? No   ? PAD/SET Patient? No   ?  ? Pain Assessment  ? Currently in Pain? No/denies   ? Multiple Pain Sites No   ? ?  ?  ? ?  ? ? ?Capillary Blood Glucose: ?No results found for this or any previous visit (from the past 24 hour(s)). ? ? ? ?Social History  ? ?Tobacco Use  ?Smoking Status Never  ?Smokeless Tobacco Never  ? ? ?Goals Met:  ?Independence with exercise equipment ?Exercise tolerated well ?No report of concerns or symptoms today ?Strength training completed today ? ?Goals Unmet:  ?Not Applicable ? ?Comments: checkout at 1545. ? ? ?Dr. Jonathan Branch is Medical Director for [facility] ?

## 2021-11-03 NOTE — Progress Notes (Signed)
Cardiac Individual Treatment Plan ? ?Patient Details  ?Name: [name] ?MRN: [id number] ?Date of Birth: 09/15/1934 ?Referring Provider:   ?Flowsheet Row CARDIAC REHAB PHASE II ORIENTATION from 09/15/2021 in [place]  ?Referring Provider Dr. Gasper Sells  ? ?  ? ? ?Initial Encounter Date:  ?Flowsheet Row CARDIAC REHAB PHASE II ORIENTATION from 09/15/2021 in [place]  ?Date 09/15/21  ? ?  ? ? ?Visit Diagnosis: NSTEMI (non-ST elevated myocardial infarction) ([place]) ? ?Patient's Home Medications on Admission: ? ?Current Outpatient Medications:  ?  aspirin EC 81 MG tablet, Take 81 mg by mouth daily. Swallow whole., Disp: , Rfl:  ?  atorvastatin (LIPITOR) 80 MG tablet, Take 80 mg by mouth daily., Disp: , Rfl:  ?  cetirizine (ZYRTEC) 10 MG tablet, Take 1 tablet (10 mg total) by mouth daily. (Patient not taking: Reported on 09/15/2021), Disp: 30 tablet, Rfl: 11 ?  fluticasone (FLONASE) 50 MCG/ACT nasal spray, Place 1 spray into both nostrils daily. (Patient not taking: Reported on 09/08/2021), Disp: 1 g, Rfl: 11 ?  furosemide (LASIX) 20 MG tablet, Take 1 tablet (20 mg total) by mouth daily., Disp: 90 tablet, Rfl: 3 ?  glipiZIDE (GLUCOTROL XL) 5 MG 24 hr tablet, Take 5 mg by mouth daily., Disp: , Rfl:  ?  latanoprost (XALATAN) 0.005 % ophthalmic solution, Place 1 drop into both eyes at bedtime., Disp: , Rfl:  ?  loratadine (CLARITIN) 10 MG tablet, Take 10 mg by mouth daily., Disp: , Rfl:  ?  meloxicam (MOBIC) 7.5 MG tablet, Take 7.5 mg by mouth daily., Disp: , Rfl:  ?  metoprolol succinate (TOPROL-XL) 25 MG 24 hr tablet, Take 25 mg by mouth daily., Disp: , Rfl:  ?  montelukast (SINGULAIR) 5 MG chewable tablet, Chew 5 mg by mouth daily., Disp: , Rfl:  ?  nitroGLYCERIN (NITROSTAT) 0.4 MG SL tablet, Place 1 tablet (0.4 mg total) under the tongue every 5 (five) minutes as needed., Disp: 25 tablet, Rfl: 3 ?  omeprazole (PRILOSEC) 20 MG capsule, Take 1 capsule (20 mg total) by mouth  daily. (Patient not taking: Reported on 09/15/2021), Disp: 30 capsule, Rfl: 4 ?  pantoprazole (PROTONIX) 40 MG tablet, Take 40 mg by mouth 2 (two) times daily., Disp: , Rfl:  ?  valsartan (DIOVAN) 160 MG tablet, Take 1 tablet (160 mg total) by mouth daily. (Patient taking differently: Take 160 mg by mouth 2 (two) times daily.), Disp: 90 tablet, Rfl: 3 ? ?Past Medical History: ?Past Medical History:  ?Diagnosis Date  ? Cramps, extremity   ? legs and hands  ? Diabetes ([place])   ? type II  ? Enterococcus UTI 02/03/2016  ? H/O cardiovascular stress test 03/05/2009  ? normal, no ischemia  ? H/O Doppler ultrasound   ? lower ext arterial doppler 04/13/09-no evidence of arterial insufficiency, normal values; distal aorta 2.4x2.7  ? HTN (hypertension)   ? Murmur, cardiac   ? echo 02/25/13- EF 60-65%, impaired relaxation-grade 1 diastolic dysfunction, mild concentric lvh, mild to moderate aortic regurgitation  ? Pneumonia   ? May 2017  ? Postoperative anemia due to acute blood loss 02/02/2016  ? Primary localized osteoarthritis of right knee   ? TIA (transient ischemic attack)   ? carotid doppler 05/08/11-normal patency  ? ? ?Tobacco Use: ?Social History  ? ?Tobacco Use  ?Smoking Status Never  ?Smokeless Tobacco Never  ? ? ?Labs: ?Review Flowsheet   ? ?  ?  Latest Ref Rng & Units 03/01/2013 11/05/2015  01/22/2016  ?Labs for ITP Cardiac and Pulmonary Rehab  ?Cholestrol 0 - 200 mg/dL 165      ?LDL (calc) 0 - 99 mg/dL 79      ?HDL-C >39 mg/dL 43      ?Trlycerides <150 mg/dL 214      ?Hemoglobin A1c 4.8 - 5.6 % 5.8   6.5   5.9    ?  ? ? Multiple values from one day are sorted in reverse-chronological order  ?  ?  ? ? ?Capillary Blood Glucose: ?Lab Results  ?Component Value Date  ? GLUCAP 116 (H) 02/03/2016  ? GLUCAP 181 (H) 02/03/2016  ? GLUCAP 218 (H) 02/02/2016  ? GLUCAP 119 (H) 02/02/2016  ? GLUCAP 163 (H) 02/02/2016  ? ? POCT Glucose   ? ? [place] Name 09/15/21 1355  ?  ?  ?  ?  ?  ? POCT Blood Glucose  ? Pre-Exercise 178 mg/dL      ? ?  ?   ? ?  ? ? ?Exercise Target Goals: ?Exercise Program Goal: ?Individual exercise prescription set using results from initial 6 min walk test and THRR while considering  patient?s activity barriers and safety.  ? ?Exercise Prescription Goal: ?Starting with aerobic activity 30 plus minutes a day, 3 days per week for initial exercise prescription. Provide home exercise prescription and guidelines that participant acknowledges understanding prior to discharge. ? ?Activity Barriers & Risk Stratification: ? Activity Barriers & Cardiac Risk Stratification - 09/15/21 1348   ? ?  ? Activity Barriers & Cardiac Risk Stratification  ? Activity Barriers Arthritis;Right Knee Replacement;Back Problems;Shortness of Breath;Balance Concerns;Assistive Device   ? Cardiac Risk Stratification High   ? ?  ?  ? ?  ? ? ?6 Minute Walk: ? 6 Minute Walk   ? ? [name] Name 09/15/21 1450  ?  ?  ?  ? 6 Minute Walk  ? Phase Initial    ? Distance 800 feet    ? Walk Time 6 minutes    ? # of Rest Breaks 0    ? MPH 1.52    ? METS 1.28    ? RPE 13    ? VO2 Peak 4.49    ? Symptoms Yes (comment)    ? Comments back pain 5/10    ? Resting HR 64 bpm    ? Resting BP 112/80    ? Resting Oxygen Saturation  99 %    ? Exercise Oxygen Saturation  during 6 min walk 95 %    ? Max Ex. HR 89 bpm    ? Max Ex. BP 140/80    ? 2 Minute Post BP 118/80    ? ?  ?  ? ?  ? ? ?Oxygen Initial Assessment: ? ? ?Oxygen Re-Evaluation: ? ? ?Oxygen Discharge (Final Oxygen Re-Evaluation): ? ? ?Initial Exercise Prescription: ? Initial Exercise Prescription - 09/15/21 1400   ? ?  ? Date of Initial Exercise RX and Referring Provider  ? Date 09/15/21   ? Referring Provider Dr. Gasper Sells   ? Expected Discharge Date 12/10/21   ?  ? NuStep  ? Level 1   ? SPM 80   ? Minutes 22   ?  ? Arm Ergometer  ? Level 1   ? RPM 60   ? Minutes 17   ?  ? Prescription Details  ? Frequency (times per week) 3   ? Duration Progress to 30 minutes of continuous aerobic without signs/symptoms of physical distress    ?  ?  Intensity  ? THRR 40-80% of Max Heartrate 54-109   ? Ratings of Perceived Exertion 11-13   ? Perceived Dyspnea 0-4   ?  ? Resistance Training  ? Training Prescription Yes   ? Weight 2 lbs   ? Reps 10-15   ? ?  ?  ? ?  ? ? ?Perform Capillary Blood Glucose checks as needed. ? ?Exercise Prescription Changes: ? ? Exercise Prescription Changes   ? ? [place] Name 09/20/21 1551 10/04/21 1550 10/18/21 1500 11/01/21 1530  ?  ?  ? Response to Exercise  ? Blood Pressure (Admit) 132/80 132/62 148/62 136/76   ? Blood Pressure (Exercise) 146/90 126/76 142/72 138/72   ? Blood Pressure (Exit) 123/70 108/70 112/52 130/58   ? Heart Rate (Admit) 67 bpm 82 bpm 66 bpm 99 bpm   ? Heart Rate (Exercise) 96 bpm 97 bpm 90 bpm 102 bpm   ? Heart Rate (Exit) 76 bpm 91 bpm 75 bpm 83 bpm   ? Rating of Perceived Exertion (Exercise) '13 12 11 11   '$ ? Duration Continue with 30 min of aerobic exercise without signs/symptoms of physical distress. Continue with 30 min of aerobic exercise without signs/symptoms of physical distress. Continue with 30 min of aerobic exercise without signs/symptoms of physical distress. Continue with 30 min of aerobic exercise without signs/symptoms of physical distress.   ? Intensity THRR unchanged THRR unchanged THRR unchanged THRR unchanged   ?  ? Progression  ? Progression Continue to progress workloads to maintain intensity without signs/symptoms of physical distress. Continue to progress workloads to maintain intensity without signs/symptoms of physical distress. Continue to progress workloads to maintain intensity without signs/symptoms of physical distress. Continue to progress workloads to maintain intensity without signs/symptoms of physical distress.   ?  ? Resistance Training  ? Training Prescription Yes Yes Yes Yes   ? Weight '2 2 2 2   '$ ? Reps 10-15 10-15 10-15 10-15   ? Time 10 Minutes 10 Minutes 10 Minutes 10 Minutes   ?  ? NuStep  ? Level '1 1 3 3   '$ ? SPM 86 99 101 103   ? Minutes '17 17 17 17   '$ ? METs 2.02  2.29 2.63 2.52   ?  ? Arm Ergometer  ? Level '1 2 2 3   '$ ? RPM 53 69 76 69   ? Minutes '22 22 22 22   '$ ? METs 1.75 2.63 3.01 3.27   ? ?  ?  ? ?  ? ? ?Exercise Comments: ? ? ?Exercise Goals and Review: ? ? Ex

## 2021-11-05 ENCOUNTER — Encounter (HOSPITAL_COMMUNITY)
Admission: RE | Admit: 2021-11-05 | Discharge: 2021-11-05 | Disposition: A | Discharge status: Home / Self Care | Payer: Medicare Other | Source: Ambulatory Visit | Attending: Internal Medicine | Admitting: Internal Medicine

## 2021-11-05 DIAGNOSIS — I214 Non-ST elevation (NSTEMI) myocardial infarction: Secondary | ICD-10-CM

## 2021-11-05 NOTE — Progress Notes (Signed)
Daily Session Note ? ?Patient Details  ?Name: [name] ?MRN: [id number] ?Date of Birth: 20-Apr-1935 ?Referring Provider:   ?Flowsheet Row CARDIAC REHAB PHASE II ORIENTATION from 09/15/2021 in [place]  ?Referring Provider Dr. Gasper Sells  ? ?  ? ? ?Encounter Date: 11/05/2021 ? ?Check In: ? Session Check In - 11/05/21 1445   ? ?  ? Check-In  ? Supervising physician immediately available to respond to emergencies [facility] MD immediately available   ? Physician(s) Dr Domenic Polite   ? Location [facility]   ? Staff Present Madelyn Flavors, RN, Bjorn Loser, MS, ACSM-CEP, Exercise Physiologist   ? Virtual Visit No   ? Medication changes reported     No   ? Comments NTG PRN added, Furosemide 20 mg daily added, valsartan 160 mg bid added   ? Tobacco Cessation No Change   ? Warm-up and Cool-down Performed as group-led instruction   ? Resistance Training Performed Yes   ? VAD Patient? No   ? PAD/SET Patient? No   ?  ? Pain Assessment  ? Currently in Pain? No/denies   ? Multiple Pain Sites No   ? ?  ?  ? ?  ? ? ?Capillary Blood Glucose: ?No results found for this or any previous visit (from the past 24 hour(s)). ? ? ? ?Social History  ? ?Tobacco Use  ?Smoking Status Never  ?Smokeless Tobacco Never  ? ? ?Goals Met:  ?Independence with exercise equipment ?Exercise tolerated well ?No report of concerns or symptoms today ?Strength training completed today ? ?Goals Unmet:  ?Not Applicable ? ?Comments: Check out at 1545. ? ? ?Dr. Carlyle Dolly is Medical Director for [place] ?

## 2021-11-07 DIAGNOSIS — E1165 Type 2 diabetes mellitus with hyperglycemia: Secondary | ICD-10-CM | POA: Diagnosis not present

## 2021-11-07 DIAGNOSIS — I10 Essential (primary) hypertension: Secondary | ICD-10-CM | POA: Diagnosis not present

## 2021-11-08 ENCOUNTER — Encounter (HOSPITAL_COMMUNITY)
Admission: RE | Admit: 2021-11-08 | Discharge: 2021-11-08 | Disposition: A | Discharge status: Home / Self Care | Payer: Medicare Other | Source: Ambulatory Visit | Attending: Internal Medicine | Admitting: Internal Medicine

## 2021-11-08 DIAGNOSIS — I214 Non-ST elevation (NSTEMI) myocardial infarction: Secondary | ICD-10-CM | POA: Insufficient documentation

## 2021-11-08 NOTE — Progress Notes (Signed)
Daily Session Note ? ?Patient Details  ?Name: CHRISTEEN LAI ?MRN: [id number] ?Date of Birth: 28-Jan-1935 ?Referring Provider:   ?Flowsheet Row CARDIAC REHAB PHASE II ORIENTATION from 09/15/2021 in [place]  ?Referring Provider Dr. Gasper Sells  ? ?  ? ? ?Encounter Date: 11/08/2021 ? ?Check In: ? Session Check In - 11/08/21 1445   ? ?  ? Check-In  ? Supervising physician immediately available to respond to emergencies [facility] MD immediately available   ? Physician(s) Dr Harl Bowie   ? Location [facility]   ? Staff Present Aundra Dubin, RN, Joanette Gula, RN, BSN;Dalton Fletcher, MS, ACSM-CEP, Exercise Physiologist;Heather Zigmund Daniel, Exercise Physiologist   ? Virtual Visit No   ? Medication changes reported     No   ? Comments NTG PRN added, Furosemide 20 mg daily added, valsartan 160 mg bid added   ? Fall or balance concerns reported    No   ? Comments Pt has not fallen but reports frequently tripping over things or losing her balance. 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   ? Tobacco Cessation No Change   ? Warm-up and Cool-down Performed as group-led instruction   ? Resistance Training Performed Yes   ? VAD Patient? No   ? PAD/SET Patient? No   ?  ? Pain Assessment  ? Currently in Pain? No/denies   ? Multiple Pain Sites No   ? ?  ?  ? ?  ? ? ?Capillary Blood Glucose: ?No results found for this or any previous visit (from the past 24 hour(s)). ? ? ? ?Social History  ? ?Tobacco Use  ?Smoking Status Never  ?Smokeless Tobacco Never  ? ? ?Goals Met:  ?Independence with exercise equipment ?Exercise tolerated well ?No report of concerns or symptoms today ?Strength training completed today ? ?Goals Unmet:  ?Not Applicable ? ?Comments: check out 1545. ? ? ?Dr. Carlyle Dolly is Medical Director for [place] ?

## 2021-11-10 ENCOUNTER — Encounter (HOSPITAL_COMMUNITY)
Admission: RE | Admit: 2021-11-10 | Discharge: 2021-11-10 | Disposition: A | Discharge status: Home / Self Care | Payer: Medicare Other | Source: Ambulatory Visit | Attending: Internal Medicine | Admitting: Internal Medicine

## 2021-11-10 DIAGNOSIS — I214 Non-ST elevation (NSTEMI) myocardial infarction: Secondary | ICD-10-CM | POA: Diagnosis not present

## 2021-11-10 NOTE — Progress Notes (Signed)
Daily Session Note ? ?Patient Details  ?Name: [name] ?MRN: [id number] ?Date of Birth: [birth year] ?Referring Provider:   ?Flowsheet Row CARDIAC REHAB PHASE II ORIENTATION from 09/15/2021 in [place]  ?Referring Provider Dr. Gasper Sells  ? ?  ? ? ?Encounter Date: 11/10/2021 ? ?Check In: ? Session Check In - 11/10/21 1445   ? ?  ? Check-In  ? Supervising physician immediately available to respond to emergencies [facility] MD immediately available   ? Physician(s) Dr. Alda Lea   ? Location [facility]   ? Staff Present Geanie Cooley, RN;Heather [place], BS, Exercise Physiologist;Dalton Fletcher, MS, ACSM-CEP, Exercise Physiologist   ? Virtual Visit No   ? Medication changes reported     No   ? Fall or balance concerns reported    No   ? Comments Pt has not fallen but reports frequently tripping over things or losing her balance. 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   ? Tobacco Cessation No Change   ? Warm-up and Cool-down Performed as group-led instruction   ? Resistance Training Performed Yes   ? VAD Patient? No   ? PAD/SET Patient? No   ?  ? Pain Assessment  ? Currently in Pain? No/denies   ? Multiple Pain Sites No   ? ?  ?  ? ?  ? ? ?Capillary Blood Glucose: ?No results found for this or any previous visit (from the past 24 hour(s)). ? ? ? ?Social History  ? ?Tobacco Use  ?Smoking Status Never  ?Smokeless Tobacco Never  ? ? ?Goals Met:  ?Independence with exercise equipment ?Exercise tolerated well ?No report of concerns or symptoms today ?Strength training completed today ? ?Goals Unmet:  ?Not Applicable ? ?Comments: check out @ 3:45pm ? ? ?Dr. Carlyle Dolly is Medical Director for [place] ?

## 2021-11-12 ENCOUNTER — Encounter (HOSPITAL_COMMUNITY)
Admission: RE | Admit: 2021-11-12 | Discharge: 2021-11-12 | Disposition: A | Discharge status: Home / Self Care | Payer: Medicare Other | Source: Ambulatory Visit | Attending: Internal Medicine | Admitting: Internal Medicine

## 2021-11-12 DIAGNOSIS — I214 Non-ST elevation (NSTEMI) myocardial infarction: Secondary | ICD-10-CM | POA: Diagnosis not present

## 2021-11-12 NOTE — Progress Notes (Signed)
Daily Session Note ? ?Patient Details  ?Name: [name] ?MRN: [id number] ?Date of Birth: [birth year] ?Referring Provider:   ?Flowsheet Row CARDIAC REHAB PHASE II ORIENTATION from 09/15/2021 in [place]  ?Referring Provider Dr. Gasper Sells  ? ?  ? ? ?Encounter Date: 11/12/2021 ? ?Check In: ? Session Check In - 11/12/21 1445   ? ?  ? Check-In  ? Supervising physician immediately available to respond to emergencies [facility] MD immediately available   ? Physician(s) Dr. Radford Pax   ? Location [facility]   ? Staff Present Geanie Cooley, RN;Heather [place], BS, Exercise Physiologist;Dalton Fletcher, MS, ACSM-CEP, Exercise Physiologist   ? Virtual Visit No   ? Medication changes reported     No   ? Fall or balance concerns reported    No   ? Comments Pt has not fallen but reports frequently tripping over things or losing her balance. 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   ? Tobacco Cessation No Change   ? Warm-up and Cool-down Performed as group-led instruction   ? Resistance Training Performed Yes   ? VAD Patient? No   ? PAD/SET Patient? No   ?  ? Pain Assessment  ? Currently in Pain? No/denies   ? Multiple Pain Sites No   ? ?  ?  ? ?  ? ? ?Capillary Blood Glucose: ?No results found for this or any previous visit (from the past 24 hour(s)). ? ? ? ?Social History  ? ?Tobacco Use  ?Smoking Status Never  ?Smokeless Tobacco Never  ? ? ?Goals Met:  ?Independence with exercise equipment ?Exercise tolerated well ?No report of concerns or symptoms today ?Strength training completed today ? ?Goals Unmet:  ?Not Applicable ? ?Comments: checkout @ 3:45pm ? ? ?Dr. Carlyle Dolly is Medical Director for [place] ?

## 2021-11-15 ENCOUNTER — Encounter (HOSPITAL_COMMUNITY): Payer: Medicare Other

## 2021-11-17 ENCOUNTER — Encounter (HOSPITAL_COMMUNITY)
Admission: RE | Admit: 2021-11-17 | Discharge: 2021-11-17 | Disposition: A | Discharge status: Home / Self Care | Payer: Medicare Other | Source: Ambulatory Visit | Attending: Internal Medicine | Admitting: Internal Medicine

## 2021-11-17 DIAGNOSIS — I214 Non-ST elevation (NSTEMI) myocardial infarction: Secondary | ICD-10-CM

## 2021-11-17 NOTE — Progress Notes (Signed)
Daily Session Note ? ?Patient Details  ?Name: AURIANNA EARLYWINE ?MRN: [id number] ?Date of Birth: 1934/08/30 ?Referring Provider:   ?Flowsheet Row CARDIAC REHAB PHASE II ORIENTATION from 09/15/2021 in [place]  ?Referring Provider Dr. Gasper Sells  ? ?  ? ? ?Encounter Date: 11/17/2021 ? ?Check In: ? Session Check In - 11/17/21 1444   ? ?  ? Check-In  ? Supervising physician immediately available to respond to emergencies [facility] MD immediately available   ? Physician(s) Dr Harrington Challenger   ? Location [facility]   ? Staff Present Madelyn Flavors, RN, Jennye Moccasin, RN, Bjorn Loser, MS, ACSM-CEP, Exercise Physiologist   ? Virtual Visit No   ? Medication changes reported     No   ? Comments NTG PRN added, Furosemide 20 mg daily added, valsartan 160 mg bid added   ? Fall or balance concerns reported    No   ? Comments Pt has not fallen but reports frequently tripping over things or losing her balance. 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   ? Tobacco Cessation No Change   ? Warm-up and Cool-down Performed as group-led instruction   ? Resistance Training Performed Yes   ? VAD Patient? No   ? PAD/SET Patient? No   ?  ? Pain Assessment  ? Currently in Pain? No/denies   ? Multiple Pain Sites No   ? ?  ?  ? ?  ? ? ?Capillary Blood Glucose: ?No results found for this or any previous visit (from the past 24 hour(s)). ? ? ? ?Social History  ? ?Tobacco Use  ?Smoking Status Never  ?Smokeless Tobacco Never  ? ? ?Goals Met:  ?Independence with exercise equipment ?Exercise tolerated well ?No report of concerns or symptoms today ?Strength training completed today ? ?Goals Unmet:  ?Not Applicable ? ?Comments: Checkout at 45. ? ? ?Dr. Carlyle Dolly is Medical Director for [place] ?

## 2021-11-19 ENCOUNTER — Encounter (HOSPITAL_COMMUNITY)
Admission: RE | Admit: 2021-11-19 | Discharge: 2021-11-19 | Disposition: A | Discharge status: Home / Self Care | Payer: Medicare Other | Source: Ambulatory Visit | Attending: Internal Medicine | Admitting: Internal Medicine

## 2021-11-19 DIAGNOSIS — I214 Non-ST elevation (NSTEMI) myocardial infarction: Secondary | ICD-10-CM

## 2021-11-19 NOTE — Progress Notes (Signed)
Daily Session Note ? ?Patient Details  ?Name: [name] ?MRN: [id number] ?Date of Birth: 1935-07-04 ?Referring Provider:   ?Flowsheet Row CARDIAC REHAB PHASE II ORIENTATION from 09/15/2021 in [place]  ?Referring Provider Dr. Gasper Sells  ? ?  ? ? ?Encounter Date: 11/19/2021 ? ?Check In: ? Session Check In - 11/19/21 1444   ? ?  ? Check-In  ? Supervising physician immediately available to respond to emergencies [facility] MD immediately available   ? Physician(s) Dr Marlou Porch   ? Location [facility]   ? Staff Present Madelyn Flavors, RN, Jennye Moccasin, RN, BSN;Heather Otho Ket, BS, Exercise Physiologist;Phyllis Windy Kalata, RN   ? Virtual Visit No   ? Medication changes reported     No   ? Comments NTG PRN added, Furosemide 20 mg daily added, valsartan 160 mg bid added   ? Fall or balance concerns reported    No   ? Tobacco Cessation No Change   ? Warm-up and Cool-down Performed as group-led instruction   ? Resistance Training Performed Yes   ? VAD Patient? No   ? PAD/SET Patient? No   ?  ? Pain Assessment  ? Currently in Pain? No/denies   ? Multiple Pain Sites No   ? ?  ?  ? ?  ? ? ?Capillary Blood Glucose: ?No results found for this or any previous visit (from the past 24 hour(s)). ? ? ? ?Social History  ? ?Tobacco Use  ?Smoking Status Never  ?Smokeless Tobacco Never  ? ? ?Goals Met:  ?Independence with exercise equipment ?Exercise tolerated well ?No report of concerns or symptoms today ?Strength training completed today ? ?Goals Unmet:  ?Not Applicable ? ?Comments: checkout at 1545. ? ? ?Dr. Carlyle Dolly is Medical Director for [place] ?

## 2021-11-22 ENCOUNTER — Encounter (HOSPITAL_COMMUNITY)
Admission: RE | Admit: 2021-11-22 | Discharge: 2021-11-22 | Disposition: A | Discharge status: Home / Self Care | Payer: Medicare Other | Source: Ambulatory Visit | Attending: Internal Medicine | Admitting: Internal Medicine

## 2021-11-22 DIAGNOSIS — I214 Non-ST elevation (NSTEMI) myocardial infarction: Secondary | ICD-10-CM | POA: Diagnosis not present

## 2021-11-22 NOTE — Progress Notes (Signed)
Daily Session Note ? ?Patient Details  ?Name: [name] ?MRN: [id number] ?Date of Birth: [birth year] ?Referring Provider:   ?Flowsheet Row CARDIAC REHAB PHASE II ORIENTATION from 09/15/2021 in [place]  ?Referring Provider Dr. Gasper Sells  ? ?  ? ? ?Encounter Date: 11/22/2021 ? ?Check In: ? Session Check In - 11/22/21 1434   ? ?  ? Check-In  ? Supervising physician immediately available to respond to emergencies [facility] MD immediately available   ? Physician(s) Dr Harl Bowie   ? Location [facility]   ? Staff Present Redge Gainer, BS, Exercise Physiologist;Shayley Medlin Hassell Done, RN, Bjorn Loser, MS, ACSM-CEP, Exercise Physiologist   ? Virtual Visit No   ? Medication changes reported     No   ? Comments NTG PRN added, Furosemide 20 mg daily added, valsartan 160 mg bid added   ? Fall or balance concerns reported    No   ? Comments Pt has not fallen but reports frequently tripping over things or losing her balance. 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   ? Tobacco Cessation No Change   ? Warm-up and Cool-down Performed as group-led instruction   ? Resistance Training Performed Yes   ? VAD Patient? No   ? PAD/SET Patient? No   ?  ? Pain Assessment  ? Currently in Pain? No/denies   ? Multiple Pain Sites No   ? ?  ?  ? ?  ? ? ?Capillary Blood Glucose: ?No results found for this or any previous visit (from the past 24 hour(s)). ? ? ? ?Social History  ? ?Tobacco Use  ?Smoking Status Never  ?Smokeless Tobacco Never  ? ? ?Goals Met:  ?Independence with exercise equipment ?Exercise tolerated well ?No report of concerns or symptoms today ?Strength training completed today ? ?Goals Unmet:  ?Not Applicable ? ?Comments: check out at 1545. ? ? ?Dr. Carlyle Dolly is Medical Director for [place] ?

## 2021-11-24 ENCOUNTER — Encounter (HOSPITAL_COMMUNITY)
Admission: RE | Admit: 2021-11-24 | Discharge: 2021-11-24 | Disposition: A | Discharge status: Home / Self Care | Payer: Medicare Other | Source: Ambulatory Visit | Attending: Internal Medicine | Admitting: Internal Medicine

## 2021-11-24 DIAGNOSIS — I214 Non-ST elevation (NSTEMI) myocardial infarction: Secondary | ICD-10-CM

## 2021-11-24 NOTE — Progress Notes (Signed)
Cardiology Office Note    Date:  11/25/2021   ID:  [name], DOB 28-Sep-1934, MRN [id number]  PCP:  Madeline Chroman, MD  Cardiologist: Madeline Lean, MD    Chief Complaint  Patient presents with   Follow-up    2 month visit    History of Present Illness:    [name] is a 87 y.o. female with past medical history of CAD (s/p NSTEMI in 07/2021 with cath showing minimal CAD and no culprit lesion identified), HTN, HLD, Type 2 DM and prior TIA who presents to the office today for 15-monthfollow-up.  She was last examined by Dr. CGasper Sims 09/2021 for [facility] at that time showed no culprit lesion for her presentation.  At the time of her follow-up, she denied any recent chest pain and was overall feeling well. She was continued on ASA, Lopressor and statin therapy. Follow-up labs were obtained and showed that her creatinine had increased to 1.76. She had been on Valsartan-HCTZ, therefore HCTZ was discontinued and Valsartan was reduced to 160 mg daily. She was started on Lasix 20 mg daily with plans for a repeat BNP and BMET in 2 weeks. By review of Care Everywhere, her creatinine had been at 1.6 - 1.7 in 07/2021.  In talking with the patient today, she reports she is participating cardiac rehab but feels like she is still having difficulty with dyspnea on exertion. No recent chest pain. Says that she feels like she cannot take a deep breath and was having issues with this for several years prior to her recent MI.  Was previously evaluated by Pulmonology but no recent follow-up. No specific orthopnea, PND or pitting edema. She does have an intermittent cough with associated wheezing at that time.   Past Medical History:  Diagnosis Date   CAD (coronary artery disease)    a. s/p NSTEMI in 07/2021 with cath showing minimal CAD and no culprit lesion identified   Cramps, extremity     legs and hands   Diabetes ([place]    type II   Enterococcus UTI 02/03/2016   H/O cardiovascular stress test 03/05/2009   normal, no ischemia   H/O Doppler ultrasound    lower ext arterial doppler 04/13/09-no evidence of arterial insufficiency, normal values; distal aorta 2.4x2.7   HTN (hypertension)    Murmur, cardiac    echo 02/25/13- EF 60-65%, impaired relaxation-grade 1 diastolic dysfunction, mild concentric lvh, mild to moderate aortic regurgitation   Pneumonia    May 2017   Postoperative anemia due to acute blood loss 02/02/2016   Primary localized osteoarthritis of right knee    TIA (transient ischemic attack)    carotid doppler 05/08/11-normal patency    Past Surgical History:  Procedure Laterality Date   ABDOMINAL HYSTERECTOMY     BUNIONECTOMY Left    COLONOSCOPY     TOTAL KNEE ARTHROPLASTY Right 02/01/2016   Procedure: TOTAL KNEE ARTHROPLASTY;  Surgeon: RElsie Saas MD;  [place]  Service: Orthopedics;  Laterality: Right;    Current Medications: Outpatient Medications Prior to Visit  Medication Sig Dispense Refill   aspirin EC 81 MG tablet Take 81 mg by mouth daily. Swallow whole.     atorvastatin (LIPITOR) 80 MG tablet Take 80 mg by mouth daily.     cetirizine (ZYRTEC) 10 MG tablet Take 1 tablet (10 mg total) by mouth daily. 30 tablet 11   furosemide (LASIX)  20 MG tablet Take 1 tablet (20 mg total) by mouth daily. 90 tablet 3   glipiZIDE (GLUCOTROL XL) 5 MG 24 hr tablet Take 5 mg by mouth daily.     latanoprost (XALATAN) 0.005 % ophthalmic solution Place 1 drop into both eyes at bedtime.     loratadine (CLARITIN) 10 MG tablet Take 10 mg by mouth daily.     meloxicam (MOBIC) 7.5 MG tablet Take 7.5 mg by mouth daily.     metoprolol succinate (TOPROL-XL) 25 MG 24 hr tablet Take 25 mg by mouth daily.     montelukast (SINGULAIR) 5 MG chewable tablet Chew 5 mg by mouth daily.     nitroGLYCERIN (NITROSTAT) 0.4 MG SL tablet Place 1 tablet (0.4 mg total) under the  tongue every 5 (five) minutes as needed. 25 tablet 3   omeprazole (PRILOSEC) 20 MG capsule Take 1 capsule (20 mg total) by mouth daily. 30 capsule 4   pantoprazole (PROTONIX) 40 MG tablet Take 40 mg by mouth 2 (two) times daily.     valsartan (DIOVAN) 160 MG tablet Take 1 tablet (160 mg total) by mouth daily. (Patient taking differently: Take 160 mg by mouth 2 (two) times daily.) 90 tablet 3   fluticasone (FLONASE) 50 MCG/ACT nasal spray Place 1 spray into both nostrils daily. (Patient not taking: Reported on 09/08/2021) 1 g 11   No facility-administered medications prior to visit.     Allergies:   Codeine and Milk-related compounds   Social History   Socioeconomic History   Marital status: Widowed    Spouse name: Not on file   Number of children: 2   Years of education: Not on file   Highest education level: Not on file  Occupational History   Not on file  Tobacco Use   Smoking status: Never   Smokeless tobacco: Never  Vaping Use   Vaping Use: Never used  Substance and Sexual Activity   Alcohol use: No    Alcohol/week: 0.0 standard drinks   Drug use: No   Sexual activity: Not on file  Other Topics Concern   Not on file  Social History Narrative   Step son lives with her.  She is a widow.   Social Determinants of Health   Financial Resource Strain: Not on file  Food Insecurity: Not on file  Transportation Needs: Not on file  Physical Activity: Not on file  Stress: Not on file  Social Connections: Not on file     Family History:  The patient's family history includes Cancer in her father and sister; Diabetes in her brother, brother, and brother; Lung cancer in her father; Stroke in her mother.   Review of Systems:    Please see the history of present illness.     All other systems reviewed and are otherwise negative except as noted above.   Physical Exam:    VS:  BP (!) 144/78   Pulse 65   Ht '5\' 9"'$  (1.753 m)   Wt 171 lb 6.4 oz (77.7 kg)   SpO2 98%   BMI  25.31 kg/m    General: Well developed, well nourished,female appearing in no acute distress. Head: Normocephalic, atraumatic. Neck: No carotid bruits. JVD not elevated.  Lungs: Respirations regular and unlabored, without wheezes or rales.  Heart: Regular rate and rhythm. No S3 or S4.  No murmur, no rubs, or gallops appreciated. Abdomen: Appears non-distended. No obvious abdominal masses. Msk:  Strength and tone appear normal for age. No obvious joint  deformities or effusions. Extremities: No clubbing or cyanosis. No pitting edema.  Distal pedal pulses are 2+ bilaterally. Varicose veins noted.  Neuro: Alert and oriented X 3. Moves all extremities spontaneously. No focal deficits noted. Psych:  Responds to questions appropriately with a normal affect. Skin: No rashes or lesions noted  Wt Readings from Last 3 Encounters:  11/25/21 171 lb 6.4 oz (77.7 kg)  11/01/21 170 lb 10.2 oz (77.4 kg)  10/18/21 172 lb 13.5 oz (78.4 kg)     Studies/Labs Reviewed:   EKG:  EKG is not ordered today.    Recent Labs: 09/08/2021: B Natriuretic Peptide 169.0; BUN 37; Creatinine, Ser 1.76; Potassium 3.8; Sodium 138   Lipid Panel    Component Value Date/Time   CHOL 165 03/01/2013 1034   TRIG 214 (H) 03/01/2013 1034   HDL 43 03/01/2013 1034   CHOLHDL 3.8 03/01/2013 1034   VLDL 43 (H) 03/01/2013 1034   LDLCALC 79 03/01/2013 1034    Additional studies/ records that were reviewed today include:   Echocardiogram: 07/2021 SUMMARY  The left ventricular size is normal.  There is normal left ventricular wall thickness.  Left ventricular systolic function is normal.  The right ventricle is normal in size and function.  The left atrial size is normal.  There is mild aortic regurgitation.  There is mild mitral regurgitation.  There is mild tricuspid regurgitation.  There is no pericardial effusion.  There is no comparison study available.   Cardiac Catheterization: 07/2021 Findings:  --LM: Short,  angiographically normal  --LAD: Minimal disease, Tortuous diagonal vessels  --LCx: Dominant, Minimal disease. First OM is quite small vessel with  diffuse disease but not culprit appearing  --RCA: Non-dominant, minimal disease   Recommendations:  - No culprit lesion identified  - Further per primary team   Assessment:    1. Coronary artery disease involving native coronary artery of native heart without angina pectoris   2. Dyspnea on exertion   3. Abnormal CXR   4. Essential hypertension   5. Hyperlipidemia LDL goal <70   6. Stage 3b chronic kidney disease ([place])      Plan:   In order of problems listed above:  1. CAD - She is s/p NSTEMI in 07/2021 with cath showing minimal CAD and no culprit lesion identified. She continues to have dyspnea on exertion which is unexplained from a cardiac perspective as recent catheterization showed minimal CAD and echocardiogram showed a preserved EF with no regional wall motion abnormalities and no significant valve abnormalities. She reports she actually experienced dyspnea on exertion for many years prior to her catheterization, therefore will plan to further evaluate with a Chest CT as discussed below. - Continue current cardiac medications with ASA 81 mg daily, Atorvastatin 80 mg daily, Toprol-XL 25 mg daily and Valsartan 160 mg daily.  2. Dyspnea on Exertion/Abnormal CXR - She reports having dyspnea on exertion for many years and has difficulty taking a deep breath. Prior CXR from [facility] in 07/2021 showed a progressive chronic infectious process with follow-up Chest CT recommended but it does not appear this was obtained. Will order a Chest CT without contrast and arrange for follow-up with Pulmonology pending results.   3. HTN - Her BP is at 144/78 during today's visit but has overall been well controlled at cardiac rehab. Continue current medical therapy with Toprol-XL 25 mg daily and Valsartan 160 mg daily.  4. HLD - Will request  a copy of most recent labs from her PCP. She  is on Atorvastatin 80 mg daily with goal LDL less than 70 in the setting of CAD and prior TIA.    5. Stage 3 CKD - Her creatinine was at 1.76 on most recent labs but it appears this was close to her baseline as creatinine was at 1.6 to 1.7 in 07/2021. She does believe she had repeat labs with her PCP in the interim and we will request a copy of these. If repeat labs have not been obtained, would plan to obtain a repeat BNP and BMET given recent adjustment from HCTZ to Lasix 20 mg daily.    Medication Adjustments/Labs and Tests Ordered: Current medicines are reviewed at length with the patient today.  Concerns regarding medicines are outlined above.  Medication changes, Labs and Tests ordered today are listed in the Patient Instructions below. Patient Instructions  Medication Instructions:  Your physician recommends that you continue on your current medications as directed. Please refer to the Current Medication list given to you today.   Labwork: None today  Testing/Procedures:  Please schedule Non-contrast Chest CT  Follow-Up: 4-5 months  Any Other Special Instructions Will Be Listed Below (If Applicable).  If you need a refill on your cardiac medications before your next appointment, please call your pharmacy.    Signed, Erma Heritage, PA-C  11/25/2021 4:11 PM    [facility] [facility] [address] [place], [place] [address] Phone: [phone] Fax: [phone]

## 2021-11-24 NOTE — Progress Notes (Signed)
Daily Session Note ? ?Patient Details  ?Name: [name] ?MRN: [id number] ?Date of Birth: [birth year] ?Referring Provider:   ?Flowsheet Row CARDIAC REHAB PHASE II ORIENTATION from 09/15/2021 in [place]  ?Referring Provider Dr. Gasper Sells  ? ?  ? ? ?Encounter Date: 11/24/2021 ? ?Check In: ? Session Check In - 11/24/21 1430   ? ?  ? Check-In  ? Supervising physician immediately available to respond to emergencies [facility] MD immediately available   ? Physician(s) Dr Harl Bowie   ? Location [facility]   ? Staff Present Redge Gainer, BS, Exercise Physiologist;Valentino Saavedra Hassell Done, RN, Bjorn Loser, MS, ACSM-CEP, Exercise Physiologist;Phyllis Windy Kalata, RN   ? Virtual Visit No   ? Medication changes reported     No   ? Comments NTG PRN added, Furosemide 20 mg daily added, valsartan 160 mg bid added   ? Fall or balance concerns reported    No   ? Comments Pt has not fallen but reports frequently tripping over things or losing her balance. 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   ? Tobacco Cessation No Change   ? Warm-up and Cool-down Performed as group-led instruction   ? Resistance Training Performed Yes   ? VAD Patient? No   ? PAD/SET Patient? No   ?  ? Pain Assessment  ? Currently in Pain? No/denies   ? Multiple Pain Sites No   ? ?  ?  ? ?  ? ? ?Capillary Blood Glucose: ?No results found for this or any previous visit (from the past 24 hour(s)). ? ? ? ?Social History  ? ?Tobacco Use  ?Smoking Status Never  ?Smokeless Tobacco Never  ? ? ?Goals Met:  ?Independence with exercise equipment ?Exercise tolerated well ?No report of concerns or symptoms today ?Strength training completed today ? ?Goals Unmet:  ?Not Applicable ? ?Comments: Check out 1545. ? ? ?Dr. Carlyle Dolly is Medical Director for [place] ?

## 2021-11-25 ENCOUNTER — Ambulatory Visit (INDEPENDENT_AMBULATORY_CARE_PROVIDER_SITE_OTHER): Payer: Medicare Other | Admitting: Student

## 2021-11-25 ENCOUNTER — Encounter: Payer: Self-pay | Admitting: Student

## 2021-11-25 VITALS — BP 144/78 | HR 65 | Ht 69.0 in | Wt 171.4 lb

## 2021-11-25 DIAGNOSIS — I10 Essential (primary) hypertension: Secondary | ICD-10-CM | POA: Diagnosis not present

## 2021-11-25 DIAGNOSIS — R9389 Abnormal findings on diagnostic imaging of other specified body structures: Secondary | ICD-10-CM

## 2021-11-25 DIAGNOSIS — R0609 Other forms of dyspnea: Secondary | ICD-10-CM

## 2021-11-25 DIAGNOSIS — E785 Hyperlipidemia, unspecified: Secondary | ICD-10-CM | POA: Diagnosis not present

## 2021-11-25 DIAGNOSIS — I2510 Atherosclerotic heart disease of native coronary artery without angina pectoris: Secondary | ICD-10-CM | POA: Diagnosis not present

## 2021-11-25 DIAGNOSIS — N1832 Chronic kidney disease, stage 3b: Secondary | ICD-10-CM | POA: Diagnosis not present

## 2021-11-25 NOTE — Patient Instructions (Signed)
Medication Instructions:  Your physician recommends that you continue on your current medications as directed. Please refer to the Current Medication list given to you today.   Labwork: None today  Testing/Procedures:  Please schedule Non-contrast Chest CT  Follow-Up: 4-5 months  Any Other Special Instructions Will Be Listed Below (If Applicable).  If you need a refill on your cardiac medications before your next appointment, please call your pharmacy.

## 2021-11-26 ENCOUNTER — Encounter (HOSPITAL_COMMUNITY)
Admission: RE | Admit: 2021-11-26 | Discharge: 2021-11-26 | Disposition: A | Discharge status: Home / Self Care | Payer: Medicare Other | Source: Ambulatory Visit | Attending: Internal Medicine | Admitting: Internal Medicine

## 2021-11-26 DIAGNOSIS — I214 Non-ST elevation (NSTEMI) myocardial infarction: Secondary | ICD-10-CM | POA: Diagnosis not present

## 2021-11-26 NOTE — Progress Notes (Signed)
Daily Session Note  Patient Details  Name: [name] MRN: [id number] Date of Birth: Aug 30, 1934 Referring Provider:   Flowsheet Row CARDIAC REHAB PHASE II ORIENTATION from 09/15/2021 in [facility]  Referring Provider Dr. Gasper Sells       Encounter Date: 11/26/2021  Check In:  Session Check In - 11/26/21 1441       Check-In   Supervising physician immediately available to respond to emergencies [facility] MD immediately available    Physician(s) Dr. Gasper Sells    Location [facility]    Staff Present Geanie Cooley, RN;Heather Otho Ket, BS, Exercise Physiologist;Dalton Kris Mouton, MS, ACSM-CEP, Exercise Physiologist    Virtual Visit No    Medication changes reported     No    Fall or balance concerns reported    Yes    Comments Pt has not fallen but reports frequently tripping over things or losing her balance. 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    Tobacco Cessation No Change    Warm-up and Cool-down Performed as group-led instruction    Resistance Training Performed Yes    VAD Patient? No    PAD/SET Patient? No      Pain Assessment   Currently in Pain? No/denies    Multiple Pain Sites No             Capillary Blood Glucose: No results found for this or any previous visit (from the past 24 hour(s)).    Social History   Tobacco Use  Smoking Status Never  Smokeless Tobacco Never    Goals Met:  Independence with exercise equipment Exercise tolerated well No report of concerns or symptoms today Strength training completed today  Goals Unmet:  Not Applicable  Comments: check out @ 3:45pm   Dr. Carlyle Dolly is Medical Director for [facility]

## 2021-11-29 ENCOUNTER — Encounter (HOSPITAL_COMMUNITY): Payer: Medicare Other

## 2021-11-29 DIAGNOSIS — Z6830 Body mass index (BMI) 30.0-30.9, adult: Secondary | ICD-10-CM | POA: Diagnosis not present

## 2021-11-29 DIAGNOSIS — Z299 Encounter for prophylactic measures, unspecified: Secondary | ICD-10-CM | POA: Diagnosis not present

## 2021-11-29 DIAGNOSIS — J069 Acute upper respiratory infection, unspecified: Secondary | ICD-10-CM | POA: Diagnosis not present

## 2021-12-01 ENCOUNTER — Encounter (HOSPITAL_COMMUNITY): Payer: Medicare Other

## 2021-12-01 NOTE — Progress Notes (Signed)
Cardiac Individual Treatment Plan  Patient Details  Name: [name] MRN: [id number] Date of Birth: [birth year] Referring Provider:   Flowsheet Row CARDIAC REHAB PHASE II ORIENTATION from 09/15/2021 in [facility]  Referring Provider Dr. Gasper Sells       Initial Encounter Date:  Flowsheet Row CARDIAC REHAB PHASE II ORIENTATION from 09/15/2021 in [facility]  Date 09/15/21       Visit Diagnosis: NSTEMI (non-ST elevated myocardial infarction) [facility])  Patient's Home Medications on Admission:  Current Outpatient Medications:    aspirin EC 81 MG tablet, Take 81 mg by mouth daily. Swallow whole., Disp: , Rfl:    atorvastatin (LIPITOR) 80 MG tablet, Take 80 mg by mouth daily., Disp: , Rfl:    cetirizine (ZYRTEC) 10 MG tablet, Take 1 tablet (10 mg total) by mouth daily., Disp: 30 tablet, Rfl: 11   furosemide (LASIX) 20 MG tablet, Take 1 tablet (20 mg total) by mouth daily., Disp: 90 tablet, Rfl: 3   glipiZIDE (GLUCOTROL XL) 5 MG 24 hr tablet, Take 5 mg by mouth daily., Disp: , Rfl:    latanoprost (XALATAN) 0.005 % ophthalmic solution, Place 1 drop into both eyes at bedtime., Disp: , Rfl:    loratadine (CLARITIN) 10 MG tablet, Take 10 mg by mouth daily., Disp: , Rfl:    meloxicam (MOBIC) 7.5 MG tablet, Take 7.5 mg by mouth daily., Disp: , Rfl:    metoprolol succinate (TOPROL-XL) 25 MG 24 hr tablet, Take 25 mg by mouth daily., Disp: , Rfl:    montelukast (SINGULAIR) 5 MG chewable tablet, Chew 5 mg by mouth daily., Disp: , Rfl:    nitroGLYCERIN (NITROSTAT) 0.4 MG SL tablet, Place 1 tablet (0.4 mg total) under the tongue every 5 (five) minutes as needed., Disp: 25 tablet, Rfl: 3   omeprazole (PRILOSEC) 20 MG capsule, Take 1 capsule (20 mg total) by mouth daily., Disp: 30 capsule, Rfl: 4   pantoprazole (PROTONIX) 40 MG tablet, Take 40 mg by mouth 2 (two) times daily., Disp: , Rfl:    valsartan (DIOVAN) 160 MG tablet, Take 1 tablet (160 mg  total) by mouth daily. (Patient taking differently: Take 160 mg by mouth 2 (two) times daily.), Disp: 90 tablet, Rfl: 3  Past Medical History: Past Medical History:  Diagnosis Date   CAD (coronary artery disease)    a. s/p NSTEMI in 07/2021 with cath showing minimal CAD and no culprit lesion identified   Cramps, extremity    legs and hands   Diabetes ([place])    type II   Enterococcus UTI 02/03/2016   H/O cardiovascular stress test 03/05/2009   normal, no ischemia   H/O Doppler ultrasound    lower ext arterial doppler 04/13/09-no evidence of arterial insufficiency, normal values; distal aorta 2.4x2.7   HTN (hypertension)    Murmur, cardiac    echo 02/25/13- EF 60-65%, impaired relaxation-grade 1 diastolic dysfunction, mild concentric lvh, mild to moderate aortic regurgitation   Pneumonia    May 2017   Postoperative anemia due to acute blood loss 02/02/2016   Primary localized osteoarthritis of right knee    TIA (transient ischemic attack)    carotid doppler 05/08/11-normal patency    Tobacco Use: Social History   Tobacco Use  Smoking Status Never  Smokeless Tobacco Never    Labs: Review Flowsheet        Latest Ref Rng & Units 03/01/2013 11/05/2015 01/22/2016  Labs for ITP Cardiac and Pulmonary Rehab  Cholestrol 0 - 200  mg/dL 165      LDL (calc) 0 - 99 mg/dL 79      HDL-C >39 mg/dL 43      Trlycerides <150 mg/dL 214      Hemoglobin A1c 4.8 - 5.6 % 5.8   6.5   5.9            Capillary Blood Glucose: Lab Results  Component Value Date   GLUCAP 116 (H) 02/03/2016   GLUCAP 181 (H) 02/03/2016   GLUCAP 218 (H) 02/02/2016   GLUCAP 119 (H) 02/02/2016   GLUCAP 163 (H) 02/02/2016    POCT Glucose     Row Name 09/15/21 1355             POCT Blood Glucose   Pre-Exercise 178 mg/dL                Exercise Target Goals: Exercise Program Goal: Individual exercise prescription set using results from initial 6 min walk test and THRR while considering  patient's  activity barriers and safety.   Exercise Prescription Goal: Starting with aerobic activity 30 plus minutes a day, 3 days per week for initial exercise prescription. Provide home exercise prescription and guidelines that participant acknowledges understanding prior to discharge.  Activity Barriers & Risk Stratification:  Activity Barriers & Cardiac Risk Stratification - 09/15/21 1348       Activity Barriers & Cardiac Risk Stratification   Activity Barriers Arthritis;Right Knee Replacement;Back Problems;Shortness of Breath;Balance Concerns;Assistive Device    Cardiac Risk Stratification High             6 Minute Walk:  6 Minute Walk     Row Name 09/15/21 1450         6 Minute Walk   Phase Initial     Distance 800 feet     Walk Time 6 minutes     # of Rest Breaks 0     MPH 1.52     METS 1.28     RPE 13     VO2 Peak 4.49     Symptoms Yes (comment)     Comments back pain 5/10     Resting HR 64 bpm     Resting BP 112/80     Resting Oxygen Saturation  99 %     Exercise Oxygen Saturation  during 6 min walk 95 %     Max Ex. HR 89 bpm     Max Ex. BP 140/80     2 Minute Post BP 118/80              Oxygen Initial Assessment:   Oxygen Re-Evaluation:   Oxygen Discharge (Final Oxygen Re-Evaluation):   Initial Exercise Prescription:  Initial Exercise Prescription - 09/15/21 1400       Date of Initial Exercise RX and Referring Provider   Date 09/15/21    Referring Provider Dr. Gasper Sells    Expected Discharge Date 12/10/21      NuStep   Level 1    SPM 80    Minutes 22      Arm Ergometer   Level 1    RPM 60    Minutes 17      Prescription Details   Frequency (times per week) 3    Duration Progress to 30 minutes of continuous aerobic without signs/symptoms of physical distress      Intensity   THRR 40-80% of Max Heartrate 54-109    Ratings of Perceived Exertion 11-13    Perceived Dyspnea 0-4  Resistance Training   Training Prescription Yes     Weight 2 lbs    Reps 10-15             Perform Capillary Blood Glucose checks as needed.  Exercise Prescription Changes:   Exercise Prescription Changes     Row Name 09/20/21 1551 10/04/21 1550 10/18/21 1500 11/01/21 1530 11/26/21 1530     Response to Exercise   Blood Pressure (Admit) 132/80 132/62 148/62 136/76 122/62   Blood Pressure (Exercise) 146/90 126/76 142/72 138/72 158/70   Blood Pressure (Exit) 123/70 108/70 112/52 130/58 128/72   Heart Rate (Admit) 67 bpm 82 bpm 66 bpm 99 bpm 87 bpm   Heart Rate (Exercise) 96 bpm 97 bpm 90 bpm 102 bpm 106 bpm   Heart Rate (Exit) 76 bpm 91 bpm 75 bpm 83 bpm 96 bpm   Rating of Perceived Exertion (Exercise) '13 12 11 11 11   '$ Duration Continue with 30 min of aerobic exercise without signs/symptoms of physical distress. Continue with 30 min of aerobic exercise without signs/symptoms of physical distress. Continue with 30 min of aerobic exercise without signs/symptoms of physical distress. Continue with 30 min of aerobic exercise without signs/symptoms of physical distress. Continue with 30 min of aerobic exercise without signs/symptoms of physical distress.   Intensity THRR unchanged THRR unchanged THRR unchanged THRR unchanged THRR unchanged     Progression   Progression Continue to progress workloads to maintain intensity without signs/symptoms of physical distress. Continue to progress workloads to maintain intensity without signs/symptoms of physical distress. Continue to progress workloads to maintain intensity without signs/symptoms of physical distress. Continue to progress workloads to maintain intensity without signs/symptoms of physical distress. Continue to progress workloads to maintain intensity without signs/symptoms of physical distress.     Resistance Training   Training Prescription Yes Yes Yes Yes Yes   Weight '2 2 2 2 2   '$ Reps 10-15 10-15 10-15 10-15 10-15   Time 10 Minutes 10 Minutes 10 Minutes 10 Minutes 1 Minutes      NuStep   Level '1 1 3 3 3   '$ SPM 86 99 101 103 104   Minutes '17 17 17 17 17   '$ METs 2.02 2.29 2.63 2.52 2.6     Arm Ergometer   Level '1 2 2 3 3   '$ RPM 53 69 76 69 69   Minutes '22 22 22 22 22   '$ METs 1.75 2.63 3.01 3.27 3.3            Exercise Comments:   Exercise Goals and Review:   Exercise Goals     Row Name 09/15/21 1453 10/04/21 1552 11/01/21 1530 11/30/21 0923       Exercise Goals   Increase Physical Activity Yes Yes Yes Yes    Intervention Provide advice, education, support and counseling about physical activity/exercise needs.;Develop an individualized exercise prescription for aerobic and resistive training based on initial evaluation findings, risk stratification, comorbidities and participant's personal goals. Provide advice, education, support and counseling about physical activity/exercise needs.;Develop an individualized exercise prescription for aerobic and resistive training based on initial evaluation findings, risk stratification, comorbidities and participant's personal goals. Provide advice, education, support and counseling about physical activity/exercise needs.;Develop an individualized exercise prescription for aerobic and resistive training based on initial evaluation findings, risk stratification, comorbidities and participant's personal goals. Provide advice, education, support and counseling about physical activity/exercise needs.;Develop an individualized exercise prescription for aerobic and resistive training based on initial evaluation findings, risk stratification, comorbidities and  participant's personal goals.    Expected Outcomes Short Term: Attend rehab on a regular basis to increase amount of physical activity.;Long Term: Exercising regularly at least 3-5 days a week.;Long Term: Add in home exercise to make exercise part of routine and to increase amount of physical activity. Short Term: Attend rehab on a regular basis to increase amount of physical  activity.;Long Term: Exercising regularly at least 3-5 days a week.;Long Term: Add in home exercise to make exercise part of routine and to increase amount of physical activity. Short Term: Attend rehab on a regular basis to increase amount of physical activity.;Long Term: Exercising regularly at least 3-5 days a week.;Long Term: Add in home exercise to make exercise part of routine and to increase amount of physical activity. Short Term: Attend rehab on a regular basis to increase amount of physical activity.;Long Term: Exercising regularly at least 3-5 days a week.;Long Term: Add in home exercise to make exercise part of routine and to increase amount of physical activity.    Increase Strength and Stamina Yes Yes Yes Yes    Intervention Develop an individualized exercise prescription for aerobic and resistive training based on initial evaluation findings, risk stratification, comorbidities and participant's personal goals.;Provide advice, education, support and counseling about physical activity/exercise needs. Develop an individualized exercise prescription for aerobic and resistive training based on initial evaluation findings, risk stratification, comorbidities and participant's personal goals.;Provide advice, education, support and counseling about physical activity/exercise needs. Develop an individualized exercise prescription for aerobic and resistive training based on initial evaluation findings, risk stratification, comorbidities and participant's personal goals.;Provide advice, education, support and counseling about physical activity/exercise needs. Develop an individualized exercise prescription for aerobic and resistive training based on initial evaluation findings, risk stratification, comorbidities and participant's personal goals.;Provide advice, education, support and counseling about physical activity/exercise needs.    Expected Outcomes Short Term: Increase workloads from initial exercise  prescription for resistance, speed, and METs.;Short Term: Perform resistance training exercises routinely during rehab and add in resistance training at home;Long Term: Improve cardiorespiratory fitness, muscular endurance and strength as measured by increased METs and functional capacity (6MWT) Short Term: Increase workloads from initial exercise prescription for resistance, speed, and METs.;Short Term: Perform resistance training exercises routinely during rehab and add in resistance training at home;Long Term: Improve cardiorespiratory fitness, muscular endurance and strength as measured by increased METs and functional capacity (6MWT) Short Term: Increase workloads from initial exercise prescription for resistance, speed, and METs.;Short Term: Perform resistance training exercises routinely during rehab and add in resistance training at home;Long Term: Improve cardiorespiratory fitness, muscular endurance and strength as measured by increased METs and functional capacity (6MWT) Short Term: Increase workloads from initial exercise prescription for resistance, speed, and METs.;Short Term: Perform resistance training exercises routinely during rehab and add in resistance training at home;Long Term: Improve cardiorespiratory fitness, muscular endurance and strength as measured by increased METs and functional capacity (6MWT)    Able to understand and use rate of perceived exertion (RPE) scale Yes Yes Yes Yes    Intervention Provide education and explanation on how to use RPE scale Provide education and explanation on how to use RPE scale Provide education and explanation on how to use RPE scale Provide education and explanation on how to use RPE scale    Expected Outcomes Short Term: Able to use RPE daily in rehab to express subjective intensity level;Long Term:  Able to use RPE to guide intensity level when exercising independently Short Term: Able to use RPE daily in  rehab to express subjective intensity  level;Long Term:  Able to use RPE to guide intensity level when exercising independently Short Term: Able to use RPE daily in rehab to express subjective intensity level;Long Term:  Able to use RPE to guide intensity level when exercising independently Short Term: Able to use RPE daily in rehab to express subjective intensity level;Long Term:  Able to use RPE to guide intensity level when exercising independently    Knowledge and understanding of Target Heart Rate Range (THRR) Yes Yes Yes Yes    Intervention Provide education and explanation of THRR including how the numbers were predicted and where they are located for reference Provide education and explanation of THRR including how the numbers were predicted and where they are located for reference Provide education and explanation of THRR including how the numbers were predicted and where they are located for reference Provide education and explanation of THRR including how the numbers were predicted and where they are located for reference    Expected Outcomes Short Term: Able to state/look up THRR;Long Term: Able to use THRR to govern intensity when exercising independently;Short Term: Able to use daily as guideline for intensity in rehab Short Term: Able to state/look up THRR;Long Term: Able to use THRR to govern intensity when exercising independently;Short Term: Able to use daily as guideline for intensity in rehab Short Term: Able to state/look up THRR;Long Term: Able to use THRR to govern intensity when exercising independently;Short Term: Able to use daily as guideline for intensity in rehab Short Term: Able to state/look up THRR;Long Term: Able to use THRR to govern intensity when exercising independently;Short Term: Able to use daily as guideline for intensity in rehab    Able to check pulse independently Yes Yes Yes Yes    Intervention Provide education and demonstration on how to check pulse in carotid and radial arteries.;Review the importance  of being able to check your own pulse for safety during independent exercise Provide education and demonstration on how to check pulse in carotid and radial arteries.;Review the importance of being able to check your own pulse for safety during independent exercise Provide education and demonstration on how to check pulse in carotid and radial arteries.;Review the importance of being able to check your own pulse for safety during independent exercise Provide education and demonstration on how to check pulse in carotid and radial arteries.;Review the importance of being able to check your own pulse for safety during independent exercise    Expected Outcomes Short Term: Able to explain why pulse checking is important during independent exercise;Long Term: Able to check pulse independently and accurately Short Term: Able to explain why pulse checking is important during independent exercise;Long Term: Able to check pulse independently and accurately Short Term: Able to explain why pulse checking is important during independent exercise;Long Term: Able to check pulse independently and accurately Short Term: Able to explain why pulse checking is important during independent exercise;Long Term: Able to check pulse independently and accurately    Understanding of Exercise Prescription Yes Yes Yes Yes    Intervention Provide education, explanation, and written materials on patient's individual exercise prescription Provide education, explanation, and written materials on patient's individual exercise prescription Provide education, explanation, and written materials on patient's individual exercise prescription Provide education, explanation, and written materials on patient's individual exercise prescription    Expected Outcomes Short Term: Able to explain program exercise prescription;Long Term: Able to explain home exercise prescription to exercise independently Short Term: Able to explain  program exercise  prescription;Long Term: Able to explain home exercise prescription to exercise independently Short Term: Able to explain program exercise prescription;Long Term: Able to explain home exercise prescription to exercise independently Short Term: Able to explain program exercise prescription;Long Term: Able to explain home exercise prescription to exercise independently             Exercise Goals Re-Evaluation :  Exercise Goals Re-Evaluation     Row Name 10/04/21 1552 11/01/21 1530 11/30/21 0923         Exercise Goal Re-Evaluation   Exercise Goals Review Increase Physical Activity;Increase Strength and Stamina;Able to understand and use rate of perceived exertion (RPE) scale;Knowledge and understanding of Target Heart Rate Range (THRR);Able to check pulse independently;Understanding of Exercise Prescription Increase Physical Activity;Increase Strength and Stamina;Able to understand and use rate of perceived exertion (RPE) scale;Knowledge and understanding of Target Heart Rate Range (THRR);Able to check pulse independently;Understanding of Exercise Prescription Increase Physical Activity;Able to understand and use rate of perceived exertion (RPE) scale;Increase Strength and Stamina;Knowledge and understanding of Target Heart Rate Range (THRR);Able to check pulse independently;Understanding of Exercise Prescription     Comments Pt has completed 8 sessions of cardiac rehab. She has had a small irritation to the gel on the electrodes. She comes to class motivated and is increaing her workload. She is currently exercising at 2.63 METs on the AE. Will continue to monitor and progress as able. Pt has completed 20 sessions of cardiac rehab. She continues to come to class motivated and continues to increase her workload. She is currently exercising at 3.27 METs on the AE. Will coninue to monitor and progress as able. Pt has competed 30 sessions of cardiac rehab. She continues to be motivated during class and  increases her workload. She is currently exercising at 3.30 METs on the AE. Will continue to monitor and progress as able.     Expected Outcomes Through exercise at rehab and at home, the patient will meet their stated goals. Through exercise at rehab and at home, the patient will meet their stated goals. Through exercise at rehab and at home, the patient will meet their stated goals.               Discharge Exercise Prescription (Final Exercise Prescription Changes):  Exercise Prescription Changes - 11/26/21 1530       Response to Exercise   Blood Pressure (Admit) 122/62    Blood Pressure (Exercise) 158/70    Blood Pressure (Exit) 128/72    Heart Rate (Admit) 87 bpm    Heart Rate (Exercise) 106 bpm    Heart Rate (Exit) 96 bpm    Rating of Perceived Exertion (Exercise) 11    Duration Continue with 30 min of aerobic exercise without signs/symptoms of physical distress.    Intensity THRR unchanged      Progression   Progression Continue to progress workloads to maintain intensity without signs/symptoms of physical distress.      Resistance Training   Training Prescription Yes    Weight 2    Reps 10-15    Time 1 Minutes      NuStep   Level 3    SPM 104    Minutes 17    METs 2.6      Arm Ergometer   Level 3    RPM 69    Minutes 22    METs 3.3             Nutrition:  Target Goals: Understanding of nutrition  guidelines, daily intake of sodium '1500mg'$ , cholesterol '200mg'$ , calories 30% from fat and 7% or less from saturated fats, daily to have 5 or more servings of fruits and vegetables.  Biometrics:  Pre Biometrics - 09/15/21 1453       Pre Biometrics   Height '5\' 8"'$  (1.727 m)    Weight 79.7 kg    Waist Circumference 43 inches    Hip Circumference 44 inches    Waist to Hip Ratio 0.98 %    BMI (Calculated) 26.72    Triceps Skinfold 10 mm    % Body Fat 36.2 %    Grip Strength 15.7 kg    Flexibility 0 in    Single Leg Stand 0 seconds               Nutrition Therapy Plan and Nutrition Goals:  Nutrition Therapy & Goals - 09/29/21 0849       Personal Nutrition Goals   Comments Patient scored 24 on her diet assessment. We offer 2 educational sessions on heart heathy nutrition with handouts and asssistance with RD referral if patient is interested.      Intervention Plan   Expected Outcomes Short Term Goal: Understand basic principles of dietary content, such as calories, fat, sodium, cholesterol and nutrients.             Nutrition Assessments:  Nutrition Assessments - 09/15/21 1407       MEDFICTS Scores   Pre Score 24            MEDIFICTS Score Key: ?70 Need to make dietary changes  40-70 Heart Healthy Diet ? 40 Therapeutic Level Cholesterol Diet   Picture Your Plate Scores: <62 Unhealthy dietary pattern with much room for improvement. 41-50 Dietary pattern unlikely to meet recommendations for good health and room for improvement. 51-60 More healthful dietary pattern, with some room for improvement.  >60 Healthy dietary pattern, although there may be some specific behaviors that could be improved.    Nutrition Goals Re-Evaluation:   Nutrition Goals Discharge (Final Nutrition Goals Re-Evaluation):   Psychosocial: Target Goals: Acknowledge presence or absence of significant depression and/or stress, maximize coping skills, provide positive support system. Participant is able to verbalize types and ability to use techniques and skills needed for reducing stress and depression.  Initial Review & Psychosocial Screening:  Initial Psych Review & Screening - 09/15/21 1351       Initial Review   Current issues with None Identified      Family Dynamics   Good Support System? Yes    Comments Her support system includes her sons who live in [place] and [place].      Barriers   Psychosocial barriers to participate in program There are no identifiable barriers or psychosocial needs.      Screening  Interventions   Interventions Encouraged to exercise    Expected Outcomes Short Term goal: Identification and review with participant of any Quality of Life or Depression concerns found by scoring the questionnaire.             Quality of Life Scores:  Quality of Life - 09/15/21 1450       Quality of Life   Select Quality of Life      Quality of Life Scores   Health/Function Pre 20.77 %    Socioeconomic Pre 27.75 %    Psych/Spiritual Pre 29.14 %    Family Pre 22.4 %    GLOBAL Pre 24.06 %  Scores of 19 and below usually indicate a poorer quality of life in these areas.  A difference of  2-3 points is a clinically meaningful difference.  A difference of 2-3 points in the total score of the Quality of Life Index has been associated with significant improvement in overall quality of life, self-image, physical symptoms, and general health in studies assessing change in quality of life.  PHQ-9: Review Flowsheet        09/15/2021  Depression screen PHQ 2/9  Decreased Interest 0  Down, Depressed, Hopeless 0  PHQ - 2 Score 0  Altered sleeping 1  Tired, decreased energy 1  Change in appetite 0  Feeling bad or failure about yourself  0  Trouble concentrating 0  Moving slowly or fidgety/restless 0  Suicidal thoughts 0  PHQ-9 Score 2  Difficult doing work/chores Somewhat difficult         Interpretation of Total Score  Total Score Depression Severity:  1-4 = Minimal depression, 5-9 = Mild depression, 10-14 = Moderate depression, 15-19 = Moderately severe depression, 20-27 = Severe depression   Psychosocial Evaluation and Intervention:  Psychosocial Evaluation - 09/15/21 1422       Psychosocial Evaluation & Interventions   Interventions Encouraged to exercise with the program and follow exercise prescription    Comments Pt has no barriers to participating in CR. She has no identifiable psychosocial issues. She scored a 2 on her PHQ-9 and this relates to her  poor sleeping patterns and lack of energy that she has at times. She reports that she has a good support system from her two sons. They live in [place] and [place]. She is widowed, but has two step sons who live nearby in [place]. She states that her goals are to get stronger and to be able to do ADL's without SOB. She is eager to start the program.    Expected Outcomes Pt will continue to have no identfiable psychosocial issues.    Continue Psychosocial Services  No Follow up required             Psychosocial Re-Evaluation:  Psychosocial Re-Evaluation     [place] Name 09/29/21 0900 10/25/21 1108 11/22/21 1301         Psychosocial Re-Evaluation   Current issues with None Identified None Identified None Identified     Comments Patient is new to the program. She has completed 4 sessions and she continues to have no psychosocial barriers identfied. We will continue to monitor her pogress. Patient has completed 16 sessions and she continues to have no psychosocial barriers identfied. She enjoys coming to the program and demonstrates an interest in improving her health. We will continue to monitor her pogress. Patient has completed 27 sessions and she continues to have no psychosocial barriers identfied. She continue to enjoy coming to the program and continues to demonstrate an interest in improving her health. We will continue to monitor her pogress.     Expected Outcomes Patient will continue to have no psychosocial barriers identified. Patient will continue to have no psychosocial barriers identified. Patient will continue to have no psychosocial barriers identified.     Interventions Stress management education;Relaxation education;Encouraged to attend Cardiac Rehabilitation for the exercise Stress management education;Relaxation education;Encouraged to attend Cardiac Rehabilitation for the exercise Stress management education;Relaxation education;Encouraged to attend Cardiac Rehabilitation for  the exercise     Continue Psychosocial Services  No Follow up required No Follow up required No Follow up required  Psychosocial Discharge (Final Psychosocial Re-Evaluation):  Psychosocial Re-Evaluation - 11/22/21 1301       Psychosocial Re-Evaluation   Current issues with None Identified    Comments Patient has completed 27 sessions and she continues to have no psychosocial barriers identfied. She continue to enjoy coming to the program and continues to demonstrate an interest in improving her health. We will continue to monitor her pogress.    Expected Outcomes Patient will continue to have no psychosocial barriers identified.    Interventions Stress management education;Relaxation education;Encouraged to attend Cardiac Rehabilitation for the exercise    Continue Psychosocial Services  No Follow up required             Vocational Rehabilitation: Provide vocational rehab assistance to qualifying candidates.   Vocational Rehab Evaluation & Intervention:  Vocational Rehab - 09/15/21 1353       Initial Vocational Rehab Evaluation & Intervention   Assessment shows need for Vocational Rehabilitation No   retired and does not want to go back to work            Education: Education Goals: Education classes will be provided on a weekly basis, covering required topics. Participant will state understanding/return demonstration of topics presented.  Learning Barriers/Preferences:  Learning Barriers/Preferences - 09/15/21 1431       Learning Barriers/Preferences   Learning Barriers None    Learning Preferences Audio;Video;Verbal Instruction;Skilled Demonstration;Individual Instruction             Education Topics: Hypertension, Hypertension Reduction -Define heart disease and high blood pressure. Discus how high blood pressure affects the body and ways to reduce high blood pressure. Flowsheet Row CARDIAC REHAB PHASE II EXERCISE from 11/24/2021 in [place]  Date 10/13/21  Educator hj  Instruction Review Code 1- Verbalizes Understanding       Exercise and Your Heart -Discuss why it is important to exercise, the FITT principles of exercise, normal and abnormal responses to exercise, and how to exercise safely. Flowsheet Row CARDIAC REHAB PHASE II EXERCISE from 11/24/2021 in [place]  Date 10/20/21  Educator [place]  Instruction Review Code 1- Verbalizes Understanding       Angina -Discuss definition of angina, causes of angina, treatment of angina, and how to decrease risk of having angina. Flowsheet Row CARDIAC REHAB PHASE II EXERCISE from 11/24/2021 in [place]  Date 10/27/21  Educator [place]  Instruction Review Code 1- Verbalizes Understanding       Cardiac Medications -Review what the following cardiac medications are used for, how they affect the body, and side effects that may occur when taking the medications.  Medications include Aspirin, Beta blockers, calcium channel blockers, ACE Inhibitors, angiotensin receptor blockers, diuretics, digoxin, and antihyperlipidemics. Flowsheet Row CARDIAC REHAB PHASE II EXERCISE from 11/24/2021 in [place]  Date 11/03/21  Educator DJ  Instruction Review Code 1- Verbalizes Understanding       Congestive Heart Failure -Discuss the definition of CHF, how to live with CHF, the signs and symptoms of CHF, and how keep track of weight and sodium intake. Flowsheet Row CARDIAC REHAB PHASE II EXERCISE from 11/24/2021 in [place]  Date 11/10/21  Educator pb  Instruction Review Code 1- Verbalizes Understanding       Heart Disease and Intimacy -Discus the effect sexual activity has on the heart, how changes occur during intimacy as we age, and safety during sexual activity.   Smoking Cessation / COPD -Discuss different methods to  quit smoking, the health benefits of quitting  smoking, and the definition of COPD. Flowsheet Row CARDIAC REHAB PHASE II EXERCISE from 11/24/2021 in [place]  Date 11/24/21  Educator DF  Instruction Review Code 1- Verbalizes Understanding       Nutrition I: Fats -Discuss the types of cholesterol, what cholesterol does to the heart, and how cholesterol levels can be controlled.   Nutrition II: Labels -Discuss the different components of food labels and how to read food label   Heart Parts/Heart Disease and PAD -Discuss the anatomy of the heart, the pathway of blood circulation through the heart, and these are affected by heart disease.   Stress I: Signs and Symptoms -Discuss the causes of stress, how stress may lead to anxiety and depression, and ways to limit stress.   Stress II: Relaxation -Discuss different types of relaxation techniques to limit stress.   Warning Signs of Stroke / TIA -Discuss definition of a stroke, what the signs and symptoms are of a stroke, and how to identify when someone is having stroke. Flowsheet Row CARDIAC REHAB PHASE II EXERCISE from 11/24/2021 in [place]  Date 10/06/21  Educator [place]  Instruction Review Code 1- Verbalizes Understanding       Knowledge Questionnaire Score:  Knowledge Questionnaire Score - 09/15/21 1355       Knowledge Questionnaire Score   Pre Score 17/24             Core Components/Risk Factors/Patient Goals at Admission:  Personal Goals and Risk Factors at Admission - 09/15/21 1430       Core Components/Risk Factors/Patient Goals on Admission   Improve shortness of breath with ADL's Yes    Intervention Provide education, individualized exercise plan and daily activity instruction to help decrease symptoms of SOB with activities of daily living.    Expected Outcomes Short Term: Improve cardiorespiratory fitness to achieve a reduction of symptoms when performing ADLs    Diabetes Yes    Intervention Provide  education about signs/symptoms and action to take for hypo/hyperglycemia.;Provide education about proper nutrition, including hydration, and aerobic/resistive exercise prescription along with prescribed medications to achieve blood glucose in normal ranges: Fasting glucose 65-99 mg/dL    Expected Outcomes Short Term: Participant verbalizes understanding of the signs/symptoms and immediate care of hyper/hypoglycemia, proper foot care and importance of medication, aerobic/resistive exercise and nutrition plan for blood glucose control.;Long Term: Attainment of HbA1C < 7%.             Core Components/Risk Factors/Patient Goals Review:   Goals and Risk Factor Review     Row Name 09/29/21 6629 10/25/21 1109 11/22/21 1302         Core Components/Risk Factors/Patient Goals Review   Personal Goals Review Diabetes;Improve shortness of breath with ADL's;Other Diabetes;Improve shortness of breath with ADL's;Other Diabetes;Improve shortness of breath with ADL's;Other     Review Patient was referred to CR with NSTEMI. She is new to the program completing 4 sessions. She has mutliple risk factors for CAD and is participating in the program for risk modication. Her current weight is 170.1 lbs down 5 lbs from her initial visit. Her blood pressure is at goal. Her last A1C on file was 07/27/21 at 6.7%. She is on Glipizide for DM control. Her personal goals for the program are to get stronger; be albe to do her ADL's without SOB. We will continue to monitor her progress as she works towards meeting these goals. Patient has completed 16 sessions.  Her current weight is 175.0 lbs up 5 lbs from since last 30 day review. She is doing well in the program with consistent attendance and progressions. She works hard during sessions. Her blood pressure is at goal with a few high readings. We will continue to monitor. Her last A1C on file was 07/27/21 at 6.7%. She is on Glipizide for DM control. Her personal goals for the program  continue to be  to get stronger; be albe to do her ADL's without SOB. We will continue to monitor her progress as she works towards meeting these goals. Patient has completed 27 sessions. Her current weight is 170.8 lbs down 5 lbs from since last 30 day review. She continues to do well in the program with consistent attendance and progressions. She works hard during sessions. Her blood pressure continues to be at goal with a few high readings. We will continue to monitor. Her last A1C on file was 07/27/21 at 6.7%. She is on Glipizide for DM control. Her personal goals for the program continue to be  to get stronger; be albe to do her ADL's without SOB. We will continue to monitor her progress as she works towards meeting these goals.     Expected Outcomes Patient will complete the program meeting both perosnal and program goals. Patient will complete the program meeting both perosnal and program goals. Patient will complete the program meeting both perosnal and program goals.              Core Components/Risk Factors/Patient Goals at Discharge (Final Review):   Goals and Risk Factor Review - 11/22/21 1302       Core Components/Risk Factors/Patient Goals Review   Personal Goals Review Diabetes;Improve shortness of breath with ADL's;Other    Review Patient has completed 27 sessions. Her current weight is 170.8 lbs down 5 lbs from since last 30 day review. She continues to do well in the program with consistent attendance and progressions. She works hard during sessions. Her blood pressure continues to be at goal with a few high readings. We will continue to monitor. Her last A1C on file was 07/27/21 at 6.7%. She is on Glipizide for DM control. Her personal goals for the program continue to be  to get stronger; be albe to do her ADL's without SOB. We will continue to monitor her progress as she works towards meeting these goals.    Expected Outcomes Patient will complete the program meeting both perosnal  and program goals.             ITP Comments:   Comments: ITP REVIEW Pt is making expected progress toward Cardiac Rehab goals after completing 30 sessions. Recommend continued exercise, life style modification, education, and increased stamina and strength.

## 2021-12-03 ENCOUNTER — Encounter (HOSPITAL_COMMUNITY): Payer: Medicare Other

## 2021-12-06 ENCOUNTER — Encounter (HOSPITAL_COMMUNITY): Payer: Medicare Other

## 2021-12-07 DIAGNOSIS — E1165 Type 2 diabetes mellitus with hyperglycemia: Secondary | ICD-10-CM | POA: Diagnosis not present

## 2021-12-07 DIAGNOSIS — I10 Essential (primary) hypertension: Secondary | ICD-10-CM | POA: Diagnosis not present

## 2021-12-08 ENCOUNTER — Encounter (HOSPITAL_COMMUNITY): Payer: Medicare Other

## 2021-12-10 ENCOUNTER — Encounter (HOSPITAL_COMMUNITY): Payer: Medicare Other

## 2021-12-10 ENCOUNTER — Ambulatory Visit (HOSPITAL_COMMUNITY)
Admission: RE | Admit: 2021-12-10 | Discharge: 2021-12-10 | Disposition: A | Discharge status: Home / Self Care | Payer: Medicare Other | Source: Ambulatory Visit | Attending: Student | Admitting: Student

## 2021-12-10 ENCOUNTER — Ambulatory Visit (HOSPITAL_COMMUNITY): Admission: RE | Admit: 2021-12-10 | Payer: Medicare Other | Source: Ambulatory Visit

## 2021-12-10 DIAGNOSIS — J948 Other specified pleural conditions: Secondary | ICD-10-CM | POA: Diagnosis not present

## 2021-12-10 DIAGNOSIS — R9389 Abnormal findings on diagnostic imaging of other specified body structures: Secondary | ICD-10-CM | POA: Insufficient documentation

## 2021-12-10 DIAGNOSIS — R0609 Other forms of dyspnea: Secondary | ICD-10-CM | POA: Insufficient documentation

## 2021-12-10 DIAGNOSIS — J984 Other disorders of lung: Secondary | ICD-10-CM | POA: Diagnosis not present

## 2021-12-13 ENCOUNTER — Encounter (HOSPITAL_COMMUNITY)
Admission: RE | Admit: 2021-12-13 | Discharge: 2021-12-13 | Disposition: A | Discharge status: Home / Self Care | Payer: Medicare Other | Source: Ambulatory Visit | Attending: Internal Medicine | Admitting: Internal Medicine

## 2021-12-13 ENCOUNTER — Telehealth: Payer: Self-pay | Admitting: *Deleted

## 2021-12-13 DIAGNOSIS — I214 Non-ST elevation (NSTEMI) myocardial infarction: Secondary | ICD-10-CM | POA: Diagnosis not present

## 2021-12-13 DIAGNOSIS — K862 Cyst of pancreas: Secondary | ICD-10-CM

## 2021-12-13 DIAGNOSIS — R0602 Shortness of breath: Secondary | ICD-10-CM

## 2021-12-13 NOTE — Progress Notes (Signed)
Daily Session Note  Patient Details  Name: [name] MRN: [id number] Date of Birth: 1934/11/21 Referring Provider:   Flowsheet Row CARDIAC REHAB PHASE II ORIENTATION from 09/15/2021 in [place]  Referring Provider Dr. Gasper Sells       Encounter Date: 12/13/2021  Check In:  Session Check In - 12/13/21 1441       Check-In   Supervising physician immediately available to respond to emergencies [facility] MD immediately available    Physician(s) Dr. Marlou Porch    Location [facility]    Staff Present Geanie Cooley, RN;Dalton Fletcher, MS, ACSM-CEP, Exercise Physiologist;Heather Zigmund Daniel, Exercise Physiologist    Virtual Visit No    Medication changes reported     No    Fall or balance concerns reported    Yes    Comments Pt has not fallen but reports frequently tripping over things or losing her balance. 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    Tobacco Cessation No Change    Warm-up and Cool-down Performed as group-led instruction    Resistance Training Performed Yes    VAD Patient? No    PAD/SET Patient? No      Pain Assessment   Currently in Pain? No/denies    Multiple Pain Sites No             Capillary Blood Glucose: No results found for this or any previous visit (from the past 24 hour(s)).    Social History   Tobacco Use  Smoking Status Never  Smokeless Tobacco Never    Goals Met:  Independence with exercise equipment Exercise tolerated well No report of concerns or symptoms today Strength training completed today  Goals Unmet:  Not Applicable  Comments: check out @ 3:45pm   Dr. Carlyle Dolly is Medical Director for [facility]

## 2021-12-13 NOTE — Telephone Encounter (Signed)
-----   Message from Erma Heritage, [place] sent at 12/12/2021  1:40 PM EDT ----- Attempted to call the patient over the weekend to review results but no answer as her phone goes straight to voicemail. Please try to reach her on Monday.   Please let the patient know her Chest CT showed evidence of interstitial lung disease which is due to scarring of the lungs and can be autoimmune or caused by exposure to different substances. As discussed during her office visit, I would recommend referral to [facility] in [place] for further evaluation (please enter referral to Pulmonology - Dr. Elsworth Soho).   Her CT also mentioned a cystic lesion along her pancreas which is likely benign but can progress over time. It was recommended to consider follow-up imaging in 2 years or additional evaluation at this time. I would recommend referral to GI for further management of this and to decide on the next step in evaluation.

## 2021-12-13 NOTE — Telephone Encounter (Signed)
Pt notified and orders placed  

## 2021-12-14 DIAGNOSIS — H401432 Capsular glaucoma with pseudoexfoliation of lens, bilateral, moderate stage: Secondary | ICD-10-CM | POA: Diagnosis not present

## 2021-12-15 ENCOUNTER — Encounter: Payer: Self-pay | Admitting: Internal Medicine

## 2021-12-15 ENCOUNTER — Encounter (HOSPITAL_COMMUNITY)
Admission: RE | Admit: 2021-12-15 | Discharge: 2021-12-15 | Disposition: A | Discharge status: Home / Self Care | Payer: Medicare Other | Source: Ambulatory Visit | Attending: Internal Medicine | Admitting: Internal Medicine

## 2021-12-15 VITALS — Ht 68.0 in | Wt 167.1 lb

## 2021-12-15 DIAGNOSIS — I214 Non-ST elevation (NSTEMI) myocardial infarction: Secondary | ICD-10-CM

## 2021-12-15 NOTE — Progress Notes (Signed)
Daily Session Note  Patient Details  Name: [name] MRN: [id number] Date of Birth: 21-Oct-1934 Referring Provider:   Flowsheet Row CARDIAC REHAB PHASE II ORIENTATION from 09/15/2021 in [place]  Referring Provider Dr. Gasper Sells       Encounter Date: 12/15/2021  Check In:  Session Check In - 12/15/21 1443       Check-In   Supervising physician immediately available to respond to emergencies [facility] MD immediately available    Physician(s) Dr Johnsie Cancel    Location [facility]    Staff Present Hoy Register, MS, ACSM-CEP, Exercise Physiologist;Heather Zigmund Daniel, Exercise Physiologist;Reshunda Strider Hassell Done, RN, BSN    Virtual Visit No    Medication changes reported     No    Comments NTG PRN added, Furosemide 20 mg daily added, valsartan 160 mg bid added    Fall or balance concerns reported    No    Comments Pt has not fallen but reports frequently tripping over things or losing her balance. 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    Tobacco Cessation No Change    Warm-up and Cool-down Performed as group-led instruction    Resistance Training Performed Yes    VAD Patient? No    PAD/SET Patient? No      Pain Assessment   Currently in Pain? No/denies    Multiple Pain Sites No             Capillary Blood Glucose: No results found for this or any previous visit (from the past 24 hour(s)).    Social History   Tobacco Use  Smoking Status Never  Smokeless Tobacco Never    Goals Met:  Independence with exercise equipment Exercise tolerated well No report of concerns or symptoms today Strength training completed today  Goals Unmet:  Not Applicable  Comments: checkout at 1545.   Dr. Carlyle Dolly is Medical Director for [facility]

## 2021-12-15 NOTE — Progress Notes (Signed)
Daily Session Note  Patient Details  Name: [name] MRN: [id number] Date of Birth: 06/23/1935 Referring Provider:   Flowsheet Row CARDIAC REHAB PHASE II ORIENTATION from 09/15/2021 in [place] [facility]  Referring Provider Dr. Chandrasekhar       Encounter Date: 12/15/2021  Check In:  Session Check In - 12/15/21 1443       Check-In   Supervising physician immediately available to respond to emergencies [facility] MD immediately available    Physician(s) Dr Nishan    Location [facility]    Staff Present Dalton Fletcher, MS, ACSM-CEP, Exercise Physiologist;Heather Jachimiak, BS, Exercise Physiologist;Dariel Pellecchia, RN, BSN    Virtual Visit No    Medication changes reported     No    Comments NTG PRN added, Furosemide 20 mg daily added, valsartan 160 mg bid added    Fall or balance concerns reported    No    Comments Pt has not fallen but reports frequently tripping over things or losing her balance. 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    Tobacco Cessation No Change    Warm-up and Cool-down Performed as group-led instruction    Resistance Training Performed Yes    VAD Patient? No    PAD/SET Patient? No      Pain Assessment   Currently in Pain? No/denies    Multiple Pain Sites No             Capillary Blood Glucose: No results found for this or any previous visit (from the past 24 hour(s)).    Social History   Tobacco Use  Smoking Status Never  Smokeless Tobacco Never    Goals Met:  Independence with exercise equipment Exercise tolerated well No report of concerns or symptoms today Strength training completed today  Goals Unmet:  Not Applicable  Comments: checkout at 1545.   Dr. Jonathan Branch is Medical Director for [facility] 

## 2021-12-17 ENCOUNTER — Encounter (HOSPITAL_COMMUNITY)
Admission: RE | Admit: 2021-12-17 | Discharge: 2021-12-17 | Disposition: A | Discharge status: Home / Self Care | Payer: Medicare Other | Source: Ambulatory Visit | Attending: Internal Medicine | Admitting: Internal Medicine

## 2021-12-17 DIAGNOSIS — I214 Non-ST elevation (NSTEMI) myocardial infarction: Secondary | ICD-10-CM

## 2021-12-17 NOTE — Progress Notes (Signed)
Daily Session Note  Patient Details  Name: ARMETTA HENRI MRN: [id number] Date of Birth: [birth year] Referring Provider:   Flowsheet Row CARDIAC REHAB PHASE II ORIENTATION from 09/15/2021 in [place]  Referring Provider Dr. Gasper Sells       Encounter Date: 12/17/2021  Check In:  Session Check In - 12/17/21 1437       Check-In   Supervising physician immediately available to respond to emergencies [facility] MD immediately available    Physician(s) Dr. Domenic Polite    Location [facility]    Staff Present Redge Gainer, BS, Exercise Physiologist;Willis Holquin Wynetta Emery, RN, Bjorn Loser, MS, ACSM-CEP, Exercise Physiologist    Virtual Visit No    Medication changes reported     No    Fall or balance concerns reported    Yes    Comments Pt has not fallen but reports frequently tripping over things or losing her balance. 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    Tobacco Cessation No Change    Warm-up and Cool-down Performed as group-led instruction    Resistance Training Performed Yes    VAD Patient? No    PAD/SET Patient? No      Pain Assessment   Currently in Pain? No/denies    Multiple Pain Sites No             Capillary Blood Glucose: No results found for this or any previous visit (from the past 24 hour(s)).    Social History   Tobacco Use  Smoking Status Never  Smokeless Tobacco Never    Goals Met:  Independence with exercise equipment Exercise tolerated well No report of concerns or symptoms today Strength training completed today  Goals Unmet:  Not Applicable  Comments: Check out 1545.   Dr. Carlyle Dolly is Medical Director for [facility]

## 2021-12-20 NOTE — Progress Notes (Signed)
Discharge Progress Report  Patient Details  Name: [name] MRN: [id number] Date of Birth: 02-Jan-1935 Referring Provider:   Flowsheet Row CARDIAC REHAB PHASE II ORIENTATION from 09/15/2021 in [place]  Referring Provider Dr. Gasper Sells        Number of Visits: 45  Reason for Discharge:  Patient reached a stable level of exercise. Patient independent in their exercise. Patient has met program and personal goals.  Smoking History:  Social History   Tobacco Use  Smoking Status Never  Smokeless Tobacco Never    Diagnosis:  NSTEMI (non-ST elevated myocardial infarction) ([place])  ADL [facility]:   Initial Exercise Prescription:  Initial Exercise Prescription - 09/15/21 1400       Date of Initial Exercise RX and Referring Provider   Date 09/15/21    Referring Provider Dr. Gasper Sells    Expected Discharge Date 12/10/21      NuStep   Level 1    SPM 80    Minutes 22      Arm Ergometer   Level 1    RPM 60    Minutes 17      Prescription Details   Frequency (times per week) 3    Duration Progress to 30 minutes of continuous aerobic without signs/symptoms of physical distress      Intensity   THRR 40-80% of Max Heartrate 54-109    Ratings of Perceived Exertion 11-13    Perceived Dyspnea 0-4      Resistance Training   Training Prescription Yes    Weight 2 lbs    Reps 10-15             Discharge Exercise Prescription (Final Exercise Prescription Changes):  Exercise Prescription Changes - 11/26/21 1530       Response to Exercise   Blood Pressure (Admit) 122/62    Blood Pressure (Exercise) 158/70    Blood Pressure (Exit) 128/72    Heart Rate (Admit) 87 bpm    Heart Rate (Exercise) 106 bpm    Heart Rate (Exit) 96 bpm    Rating of Perceived Exertion (Exercise) 11    Duration Continue with 30 min of aerobic exercise without signs/symptoms of physical distress.    Intensity THRR unchanged      Progression   Progression  Continue to progress workloads to maintain intensity without signs/symptoms of physical distress.      Resistance Training   Training Prescription Yes    Weight 2    Reps 10-15    Time 1 Minutes      NuStep   Level 3    SPM 104    Minutes 17    METs 2.6      Arm Ergometer   Level 3    RPM 69    Minutes 22    METs 3.3             Functional Capacity:  6 Minute Walk     Row Name 09/15/21 1450 12/15/21 1517       6 Minute Walk   Phase Initial Discharge    Distance 800 feet 800 feet    Walk Time 6 minutes 6 minutes    # of Rest Breaks 0 1    MPH 1.52 1.51    METS 1.28 1.45    RPE 13 14    VO2 Peak 4.49 5.1    Symptoms Yes (comment) Yes (comment)    Comments back pain 5/10 standing break 20 seconds due to being tired  Resting HR 64 bpm 82 bpm    Resting BP 112/80 120/60    Resting Oxygen Saturation  99 % 92 %    Exercise Oxygen Saturation  during 6 min walk 95 % 90 %    Max Ex. HR 89 bpm 106 bpm    Max Ex. BP 140/80 130/60    2 Minute Post BP 118/80 122/60             Psychological, QOL, Others - Outcomes: PHQ 2/9:    12/20/2021    3:12 PM 09/15/2021    1:49 PM  Depression screen PHQ 2/9  Decreased Interest 2 0  Down, Depressed, Hopeless 1 0  PHQ - 2 Score 3 0  Altered sleeping 2 1  Tired, decreased energy 3 1  Change in appetite 2 0  Feeling bad or failure about yourself  1 0  Trouble concentrating 1 0  Moving slowly or fidgety/restless 0 0  Suicidal thoughts 0 0  PHQ-9 Score 12 2  Difficult doing work/chores Somewhat difficult Somewhat difficult    Quality of Life:  Quality of Life - 12/20/21 1501       Quality of Life Scores   Health/Function Pre 20.77 %    Health/Function Post 11.73 %    Health/Function % Change -43.52 %    Socioeconomic Pre 27.75 %    Socioeconomic Post 13.6 %    Socioeconomic % Change  -50.99 %    Psych/Spiritual Pre 29.14 %    Psych/Spiritual Post 16.29 %    Psych/Spiritual % Change -44.1 %    Family Pre  22.4 %    Family Post 18.13 %    Family % Change -19.06 %    GLOBAL Pre 24.06 %    GLOBAL Post 14.03 %    GLOBAL % Change -41.69 %             Personal Goals: Goals established at orientation with interventions provided to work toward goal.  Personal Goals and Risk Factors at Admission - 09/15/21 1430       Core Components/Risk Factors/Patient Goals on Admission   Improve shortness of breath with ADL's Yes    Intervention Provide education, individualized exercise plan and daily activity instruction to help decrease symptoms of SOB with activities of daily living.    Expected Outcomes Short Term: Improve cardiorespiratory fitness to achieve a reduction of symptoms when performing ADLs    Diabetes Yes    Intervention Provide education about signs/symptoms and action to take for hypo/hyperglycemia.;Provide education about proper nutrition, including hydration, and aerobic/resistive exercise prescription along with prescribed medications to achieve blood glucose in normal ranges: Fasting glucose 65-99 mg/dL    Expected Outcomes Short Term: Participant verbalizes understanding of the signs/symptoms and immediate care of hyper/hypoglycemia, proper foot care and importance of medication, aerobic/resistive exercise and nutrition plan for blood glucose control.;Long Term: Attainment of HbA1C < 7%.              Personal Goals Discharge:  Goals and Risk Factor Review     Row Name 09/29/21 5361 10/25/21 1109 11/22/21 1302 12/20/21 1521       Core Components/Risk Factors/Patient Goals Review   Personal Goals Review Diabetes;Improve shortness of breath with ADL's;Other Diabetes;Improve shortness of breath with ADL's;Other Diabetes;Improve shortness of breath with ADL's;Other Diabetes;Improve shortness of breath with ADL's;Other    Review Patient was referred to CR with NSTEMI. She is new to the program completing 4 sessions. She has mutliple risk factors for  CAD and is participating in the  program for risk modication. Her current weight is 170.1 lbs down 5 lbs from her initial visit. Her blood pressure is at goal. Her last A1C on file was 07/27/21 at 6.7%. She is on Glipizide for DM control. Her personal goals for the program are to get stronger; be albe to do her ADL's without SOB. We will continue to monitor her progress as she works towards meeting these goals. Patient has completed 16 sessions. Her current weight is 175.0 lbs up 5 lbs from since last 30 day review. She is doing well in the program with consistent attendance and progressions. She works hard during sessions. Her blood pressure is at goal with a few high readings. We will continue to monitor. Her last A1C on file was 07/27/21 at 6.7%. She is on Glipizide for DM control. Her personal goals for the program continue to be  to get stronger; be albe to do her ADL's without SOB. We will continue to monitor her progress as she works towards meeting these goals. Patient has completed 27 sessions. Her current weight is 170.8 lbs down 5 lbs from since last 30 day review. She continues to do well in the program with consistent attendance and progressions. She works hard during sessions. Her blood pressure continues to be at goal with a few high readings. We will continue to monitor. Her last A1C on file was 07/27/21 at 6.7%. She is on Glipizide for DM control. Her personal goals for the program continue to be  to get stronger; be albe to do her ADL's without SOB. We will continue to monitor her progress as she works towards meeting these goals. Pt graduated from CR after 33 sessions. She lost 8.5 lbs while she was in the program. She had good attendance and gave good effort while she was in the program. Her vitals were WNLs while she was in the program, outside of a couple of high readings. She continues to exerperience DOE, and she has been for over two years. She has been seeing a pulmonologist as well and they have also referred her to a GI  doctor.    Expected Outcomes Patient will complete the program meeting both perosnal and program goals. Patient will complete the program meeting both perosnal and program goals. Patient will complete the program meeting both perosnal and program goals. Pt will continue to work towards their goals post discharge.             Exercise Goals and Review:  Exercise Goals     Row Name 09/15/21 1453 10/04/21 1552 11/01/21 1530 11/30/21 0923       Exercise Goals   Increase Physical Activity Yes Yes Yes Yes    Intervention Provide advice, education, support and counseling about physical activity/exercise needs.;Develop an individualized exercise prescription for aerobic and resistive training based on initial evaluation findings, risk stratification, comorbidities and participant's personal goals. Provide advice, education, support and counseling about physical activity/exercise needs.;Develop an individualized exercise prescription for aerobic and resistive training based on initial evaluation findings, risk stratification, comorbidities and participant's personal goals. Provide advice, education, support and counseling about physical activity/exercise needs.;Develop an individualized exercise prescription for aerobic and resistive training based on initial evaluation findings, risk stratification, comorbidities and participant's personal goals. Provide advice, education, support and counseling about physical activity/exercise needs.;Develop an individualized exercise prescription for aerobic and resistive training based on initial evaluation findings, risk stratification, comorbidities and participant's personal goals.    Expected Outcomes  Short Term: Attend rehab on a regular basis to increase amount of physical activity.;Long Term: Exercising regularly at least 3-5 days a week.;Long Term: Add in home exercise to make exercise part of routine and to increase amount of physical activity. Short Term:  Attend rehab on a regular basis to increase amount of physical activity.;Long Term: Exercising regularly at least 3-5 days a week.;Long Term: Add in home exercise to make exercise part of routine and to increase amount of physical activity. Short Term: Attend rehab on a regular basis to increase amount of physical activity.;Long Term: Exercising regularly at least 3-5 days a week.;Long Term: Add in home exercise to make exercise part of routine and to increase amount of physical activity. Short Term: Attend rehab on a regular basis to increase amount of physical activity.;Long Term: Exercising regularly at least 3-5 days a week.;Long Term: Add in home exercise to make exercise part of routine and to increase amount of physical activity.    Increase Strength and Stamina Yes Yes Yes Yes    Intervention Develop an individualized exercise prescription for aerobic and resistive training based on initial evaluation findings, risk stratification, comorbidities and participant's personal goals.;Provide advice, education, support and counseling about physical activity/exercise needs. Develop an individualized exercise prescription for aerobic and resistive training based on initial evaluation findings, risk stratification, comorbidities and participant's personal goals.;Provide advice, education, support and counseling about physical activity/exercise needs. Develop an individualized exercise prescription for aerobic and resistive training based on initial evaluation findings, risk stratification, comorbidities and participant's personal goals.;Provide advice, education, support and counseling about physical activity/exercise needs. Develop an individualized exercise prescription for aerobic and resistive training based on initial evaluation findings, risk stratification, comorbidities and participant's personal goals.;Provide advice, education, support and counseling about physical activity/exercise needs.    Expected  Outcomes Short Term: Increase workloads from initial exercise prescription for resistance, speed, and METs.;Short Term: Perform resistance training exercises routinely during rehab and add in resistance training at home;Long Term: Improve cardiorespiratory fitness, muscular endurance and strength as measured by increased METs and functional capacity (6MWT) Short Term: Increase workloads from initial exercise prescription for resistance, speed, and METs.;Short Term: Perform resistance training exercises routinely during rehab and add in resistance training at home;Long Term: Improve cardiorespiratory fitness, muscular endurance and strength as measured by increased METs and functional capacity (6MWT) Short Term: Increase workloads from initial exercise prescription for resistance, speed, and METs.;Short Term: Perform resistance training exercises routinely during rehab and add in resistance training at home;Long Term: Improve cardiorespiratory fitness, muscular endurance and strength as measured by increased METs and functional capacity (6MWT) Short Term: Increase workloads from initial exercise prescription for resistance, speed, and METs.;Short Term: Perform resistance training exercises routinely during rehab and add in resistance training at home;Long Term: Improve cardiorespiratory fitness, muscular endurance and strength as measured by increased METs and functional capacity (6MWT)    Able to understand and use rate of perceived exertion (RPE) scale Yes Yes Yes Yes    Intervention Provide education and explanation on how to use RPE scale Provide education and explanation on how to use RPE scale Provide education and explanation on how to use RPE scale Provide education and explanation on how to use RPE scale    Expected Outcomes Short Term: Able to use RPE daily in rehab to express subjective intensity level;Long Term:  Able to use RPE to guide intensity level when exercising independently Short Term: Able to  use RPE daily in rehab to express subjective intensity level;Long Term:  Able to use RPE to guide intensity level when exercising independently Short Term: Able to use RPE daily in rehab to express subjective intensity level;Long Term:  Able to use RPE to guide intensity level when exercising independently Short Term: Able to use RPE daily in rehab to express subjective intensity level;Long Term:  Able to use RPE to guide intensity level when exercising independently    Knowledge and understanding of Target Heart Rate Range (THRR) Yes Yes Yes Yes    Intervention Provide education and explanation of THRR including how the numbers were predicted and where they are located for reference Provide education and explanation of THRR including how the numbers were predicted and where they are located for reference Provide education and explanation of THRR including how the numbers were predicted and where they are located for reference Provide education and explanation of THRR including how the numbers were predicted and where they are located for reference    Expected Outcomes Short Term: Able to state/look up THRR;Long Term: Able to use THRR to govern intensity when exercising independently;Short Term: Able to use daily as guideline for intensity in rehab Short Term: Able to state/look up THRR;Long Term: Able to use THRR to govern intensity when exercising independently;Short Term: Able to use daily as guideline for intensity in rehab Short Term: Able to state/look up THRR;Long Term: Able to use THRR to govern intensity when exercising independently;Short Term: Able to use daily as guideline for intensity in rehab Short Term: Able to state/look up THRR;Long Term: Able to use THRR to govern intensity when exercising independently;Short Term: Able to use daily as guideline for intensity in rehab    Able to check pulse independently Yes Yes Yes Yes    Intervention Provide education and demonstration on how to check pulse  in carotid and radial arteries.;Review the importance of being able to check your own pulse for safety during independent exercise Provide education and demonstration on how to check pulse in carotid and radial arteries.;Review the importance of being able to check your own pulse for safety during independent exercise Provide education and demonstration on how to check pulse in carotid and radial arteries.;Review the importance of being able to check your own pulse for safety during independent exercise Provide education and demonstration on how to check pulse in carotid and radial arteries.;Review the importance of being able to check your own pulse for safety during independent exercise    Expected Outcomes Short Term: Able to explain why pulse checking is important during independent exercise;Long Term: Able to check pulse independently and accurately Short Term: Able to explain why pulse checking is important during independent exercise;Long Term: Able to check pulse independently and accurately Short Term: Able to explain why pulse checking is important during independent exercise;Long Term: Able to check pulse independently and accurately Short Term: Able to explain why pulse checking is important during independent exercise;Long Term: Able to check pulse independently and accurately    Understanding of Exercise Prescription Yes Yes Yes Yes    Intervention Provide education, explanation, and written materials on patient's individual exercise prescription Provide education, explanation, and written materials on patient's individual exercise prescription Provide education, explanation, and written materials on patient's individual exercise prescription Provide education, explanation, and written materials on patient's individual exercise prescription    Expected Outcomes Short Term: Able to explain program exercise prescription;Long Term: Able to explain home exercise prescription to exercise independently  Short Term: Able to explain program exercise prescription;Long Term: Able to explain home  exercise prescription to exercise independently Short Term: Able to explain program exercise prescription;Long Term: Able to explain home exercise prescription to exercise independently Short Term: Able to explain program exercise prescription;Long Term: Able to explain home exercise prescription to exercise independently             Exercise Goals Re-Evaluation:  Exercise Goals Re-Evaluation     Row Name 10/04/21 1552 11/01/21 1530 11/30/21 0923         Exercise Goal Re-Evaluation   Exercise Goals Review Increase Physical Activity;Increase Strength and Stamina;Able to understand and use rate of perceived exertion (RPE) scale;Knowledge and understanding of Target Heart Rate Range (THRR);Able to check pulse independently;Understanding of Exercise Prescription Increase Physical Activity;Increase Strength and Stamina;Able to understand and use rate of perceived exertion (RPE) scale;Knowledge and understanding of Target Heart Rate Range (THRR);Able to check pulse independently;Understanding of Exercise Prescription Increase Physical Activity;Able to understand and use rate of perceived exertion (RPE) scale;Increase Strength and Stamina;Knowledge and understanding of Target Heart Rate Range (THRR);Able to check pulse independently;Understanding of Exercise Prescription     Comments Pt has completed 8 sessions of cardiac rehab. She has had a small irritation to the gel on the electrodes. She comes to class motivated and is increaing her workload. She is currently exercising at 2.63 METs on the AE. Will continue to monitor and progress as able. Pt has completed 20 sessions of cardiac rehab. She continues to come to class motivated and continues to increase her workload. She is currently exercising at 3.27 METs on the AE. Will coninue to monitor and progress as able. Pt has competed 30 sessions of cardiac rehab. She  continues to be motivated during class and increases her workload. She is currently exercising at 3.30 METs on the AE. Will continue to monitor and progress as able.     Expected Outcomes Through exercise at rehab and at home, the patient will meet their stated goals. Through exercise at rehab and at home, the patient will meet their stated goals. Through exercise at rehab and at home, the patient will meet their stated goals.              Nutrition & Weight - Outcomes:  Pre Biometrics - 09/15/21 1453       Pre Biometrics   Height $Remov'5\' 8"'Tmimma$  (1.727 m)    Weight 175 lb 11.3 oz (79.7 kg)    Waist Circumference 43 inches    Hip Circumference 44 inches    Waist to Hip Ratio 0.98 %    BMI (Calculated) 26.72    Triceps Skinfold 10 mm    % Body Fat 36.2 %    Grip Strength 15.7 kg    Flexibility 0 in    Single Leg Stand 0 seconds             Post Biometrics - 12/15/21 1518        Post  Biometrics   Height $Remov'5\' 8"'MOcjyR$  (1.727 m)    Weight 167 lb 1.7 oz (75.8 kg)    Waist Circumference 43 inches    Hip Circumference 42 inches    Waist to Hip Ratio 1.02 %    BMI (Calculated) 25.41    Triceps Skinfold 8 mm    % Body Fat 35 %    Grip Strength 13.4 kg    Flexibility 0 in    Single Leg Stand 0 seconds             Nutrition:  Nutrition Therapy & Goals -  09/29/21 0849       Personal Nutrition Goals   Comments Patient scored 24 on her diet assessment. We offer 2 educational sessions on heart heathy nutrition with handouts and asssistance with RD referral if patient is interested.      Intervention Plan   Expected Outcomes Short Term Goal: Understand basic principles of dietary content, such as calories, fat, sodium, cholesterol and nutrients.             Nutrition Discharge:  Nutrition Assessments - 12/20/21 1511       MEDFICTS Scores   Pre Score 24    Post Score 18    Score Difference -6             Education Questionnaire Score:  Knowledge Questionnaire Score  - 12/20/21 1503       Knowledge Questionnaire Score   Pre Score 17/24    Post Score 18/24             Goals reviewed with patient; copy given to patient. Pt graduated from [facility] after 33 sessions. Her walk test distance was the same at both orientation and discharge, and her MET level at graduation was 3.48. She reports no specific plans to continue to exercise at this time. She has a consult visit with a pulmonologist in July, and she is hoping to get some answers regarding her ongoing dypnea on exertion.

## 2022-01-05 ENCOUNTER — Ambulatory Visit: Payer: Medicare Other | Admitting: Gastroenterology

## 2022-01-06 DIAGNOSIS — I10 Essential (primary) hypertension: Secondary | ICD-10-CM | POA: Diagnosis not present

## 2022-01-06 DIAGNOSIS — E1165 Type 2 diabetes mellitus with hyperglycemia: Secondary | ICD-10-CM | POA: Diagnosis not present

## 2022-01-11 NOTE — Progress Notes (Unsigned)
Referring Provider: Erma Heritage, PA-C Primary Care Physician:  Glenda Chroman, MD Primary Gastroenterologist:  Dr. Abbey Chatters  No chief complaint on file.   HPI:   [name] is a 87 y.o. female presenting today at the request of Erma Heritage, PA-C for cystic mass of the pancreas.  Reviewed recent CT chest without contrast which showed findings of suspected chronic interstitial lung disease, worsened compared to 2017, and large cystic pancreatic lesion extending into the lesser sac measuring greater than 3 cm which could represent an intraductal papillary mucinous neoplasm with recommendations to consider 2-year follow-up for EUS.  Also with well-circumscribed low-density lesion in the left hepatic lobe favoring cyst versus hemangioma.  Past Medical History:  Diagnosis Date   CAD (coronary artery disease)    a. s/p NSTEMI in 07/2021 with cath showing minimal CAD and no culprit lesion identified   Cramps, extremity    legs and hands   Diabetes ([place])    type II   Enterococcus UTI 02/03/2016   H/O cardiovascular stress test 03/05/2009   normal, no ischemia   H/O Doppler ultrasound    lower ext arterial doppler 04/13/09-no evidence of arterial insufficiency, normal values; distal aorta 2.4x2.7   HTN (hypertension)    Murmur, cardiac    echo 02/25/13- EF 60-65%, impaired relaxation-grade 1 diastolic dysfunction, mild concentric lvh, mild to moderate aortic regurgitation   Pneumonia    May 2017   Postoperative anemia due to acute blood loss 02/02/2016   Primary localized osteoarthritis of right knee    TIA (transient ischemic attack)    carotid doppler 05/08/11-normal patency    Past Surgical History:  Procedure Laterality Date   ABDOMINAL HYSTERECTOMY     BUNIONECTOMY Left    COLONOSCOPY     TOTAL KNEE ARTHROPLASTY Right 02/01/2016   Procedure: TOTAL KNEE ARTHROPLASTY;  Surgeon: Elsie Saas, MD;  Location: [facility];  Service: Orthopedics;  Laterality: Right;     Current Outpatient Medications  Medication Sig Dispense Refill   aspirin EC 81 MG tablet Take 81 mg by mouth daily. Swallow whole.     atorvastatin (LIPITOR) 80 MG tablet Take 80 mg by mouth daily.     cetirizine (ZYRTEC) 10 MG tablet Take 1 tablet (10 mg total) by mouth daily. 30 tablet 11   furosemide (LASIX) 20 MG tablet Take 1 tablet (20 mg total) by mouth daily. 90 tablet 3   glipiZIDE (GLUCOTROL XL) 5 MG 24 hr tablet Take 5 mg by mouth daily.     latanoprost (XALATAN) 0.005 % ophthalmic solution Place 1 drop into both eyes at bedtime.     loratadine (CLARITIN) 10 MG tablet Take 10 mg by mouth daily.     meloxicam (MOBIC) 7.5 MG tablet Take 7.5 mg by mouth daily.     metoprolol succinate (TOPROL-XL) 25 MG 24 hr tablet Take 25 mg by mouth daily.     montelukast (SINGULAIR) 5 MG chewable tablet Chew 5 mg by mouth daily.     nitroGLYCERIN (NITROSTAT) 0.4 MG SL tablet Place 1 tablet (0.4 mg total) under the tongue every 5 (five) minutes as needed. 25 tablet 3   omeprazole (PRILOSEC) 20 MG capsule Take 1 capsule (20 mg total) by mouth daily. 30 capsule 4   pantoprazole (PROTONIX) 40 MG tablet Take 40 mg by mouth 2 (two) times daily.     valsartan (DIOVAN) 160 MG tablet Take 1 tablet (160 mg total) by mouth daily. (Patient taking differently: Take 160 mg  by mouth 2 (two) times daily.) 90 tablet 3   No current facility-administered medications for this visit.    Allergies as of 01/13/2022 - Review Complete 11/25/2021  Allergen Reaction Noted   Codeine Nausea Only 10/17/2014   Milk-related compounds Diarrhea 11/03/2015    Family History  Problem Relation Age of Onset   Stroke Mother    Cancer Father    Lung cancer Father    Diabetes Brother    Diabetes Brother    Diabetes Brother    Cancer Sister     Social History   Socioeconomic History   Marital status: Widowed    Spouse name: Not on file   Number of children: 2   Years of education: Not on file   Highest education  level: Not on file  Occupational History   Not on file  Tobacco Use   Smoking status: Never   Smokeless tobacco: Never  Vaping Use   Vaping Use: Never used  Substance and Sexual Activity   Alcohol use: No    Alcohol/week: 0.0 standard drinks of alcohol   Drug use: No   Sexual activity: Not on file  Other Topics Concern   Not on file  Social History Narrative   Step son lives with her.  She is a widow.   Social Determinants of Health   Financial Resource Strain: Not on file  Food Insecurity: Not on file  Transportation Needs: Not on file  Physical Activity: Not on file  Stress: Not on file  Social Connections: Not on file  Intimate Partner Violence: Not on file    Review of Systems: Gen: Denies any fever, chills, cold or flu like symptoms, pre-syncope, or syncope.  CV: Denies chest pain, heart palpitations. Resp: Denies shortness of breath, cough.  GI: See HPI GU : Denies urinary burning, urinary frequency, urinary hesitancy MS: Denies joint pain. Derm: Denies rash. Psych: Denies depression, anxiety. Heme: See HPI  Physical Exam: There were no vitals taken for this visit. General:   Alert and oriented. Pleasant and cooperative. Well-nourished and well-developed.  Head:  Normocephalic and atraumatic. Eyes:  Without icterus, sclera clear and conjunctiva pink.  Ears:  Normal auditory acuity. Lungs:  Clear to auscultation bilaterally. No wheezes, rales, or rhonchi. No distress.  Heart:  S1, S2 present without murmurs appreciated.  Abdomen:  +BS, soft, non-tender and non-distended. No HSM noted. No guarding or rebound. No masses appreciated.  Rectal:  Deferred  Msk:  Symmetrical without gross deformities. Normal posture. Extremities:  Without edema. Neurologic:  Alert and  oriented x4;  grossly normal neurologically. Skin:  Intact without significant lesions or rashes. Psych:   Normal mood and affect.    Assessment:     Plan:  ***   Aliene Altes,  PA-C [facility] 01/13/2022

## 2022-01-12 DIAGNOSIS — R1310 Dysphagia, unspecified: Secondary | ICD-10-CM | POA: Diagnosis not present

## 2022-01-12 DIAGNOSIS — I10 Essential (primary) hypertension: Secondary | ICD-10-CM | POA: Diagnosis not present

## 2022-01-12 DIAGNOSIS — Z299 Encounter for prophylactic measures, unspecified: Secondary | ICD-10-CM | POA: Diagnosis not present

## 2022-01-12 DIAGNOSIS — E1165 Type 2 diabetes mellitus with hyperglycemia: Secondary | ICD-10-CM | POA: Diagnosis not present

## 2022-01-13 ENCOUNTER — Encounter: Payer: Self-pay | Admitting: Gastroenterology

## 2022-01-13 ENCOUNTER — Ambulatory Visit (INDEPENDENT_AMBULATORY_CARE_PROVIDER_SITE_OTHER): Payer: Medicare Other | Admitting: Internal Medicine

## 2022-01-13 ENCOUNTER — Encounter: Payer: Self-pay | Admitting: Internal Medicine

## 2022-01-13 ENCOUNTER — Telehealth: Payer: Self-pay | Admitting: *Deleted

## 2022-01-13 ENCOUNTER — Telehealth: Payer: Self-pay | Admitting: Gastroenterology

## 2022-01-13 ENCOUNTER — Ambulatory Visit (INDEPENDENT_AMBULATORY_CARE_PROVIDER_SITE_OTHER): Payer: Medicare Other | Admitting: Gastroenterology

## 2022-01-13 VITALS — BP 126/73 | HR 87 | Temp 97.8°F | Ht 68.0 in | Wt 163.0 lb

## 2022-01-13 DIAGNOSIS — K869 Disease of pancreas, unspecified: Secondary | ICD-10-CM | POA: Diagnosis not present

## 2022-01-13 DIAGNOSIS — R053 Chronic cough: Secondary | ICD-10-CM | POA: Diagnosis not present

## 2022-01-13 DIAGNOSIS — K862 Cyst of pancreas: Secondary | ICD-10-CM

## 2022-01-13 DIAGNOSIS — K5909 Other constipation: Secondary | ICD-10-CM | POA: Diagnosis not present

## 2022-01-13 DIAGNOSIS — I2510 Atherosclerotic heart disease of native coronary artery without angina pectoris: Secondary | ICD-10-CM | POA: Diagnosis not present

## 2022-01-13 DIAGNOSIS — K769 Liver disease, unspecified: Secondary | ICD-10-CM

## 2022-01-13 DIAGNOSIS — R0609 Other forms of dyspnea: Secondary | ICD-10-CM | POA: Diagnosis not present

## 2022-01-13 DIAGNOSIS — R1310 Dysphagia, unspecified: Secondary | ICD-10-CM | POA: Insufficient documentation

## 2022-01-13 MED ORDER — FAMOTIDINE 20 MG PO TABS: ORAL_TABLET | ORAL | 11 refills | Status: DC

## 2022-01-13 MED ORDER — PANTOPRAZOLE SODIUM 40 MG PO TBEC: 40.0000 mg | DELAYED_RELEASE_TABLET | Freq: Every day | ORAL | 2 refills | Status: DC

## 2022-01-13 NOTE — Telephone Encounter (Signed)
   Name: [name]  DOB: October 04, 1934  MRN: [id number]  Primary Cardiologist: Werner Lean, MD  Preoperative team, please contact this patient and set up a phone call appointment for further preoperative risk assessment. Please obtain consent and complete medication review. Thank you for your help.  I confirm that guidance regarding antiplatelet and oral anticoagulation therapy has been completed and, if necessary, noted below.  Lenna Sciara, NP 01/13/2022, 12:22 PM [place] [address] [place], [place] [address]

## 2022-01-13 NOTE — Telephone Encounter (Addendum)
Reviewed case with Dr. Rush Landmark. Recommended starting with MRI/MRCP to evaluate pancreatic lesion vs pancreas protocol CT if she cannot tolerate the MRI. Also recommended checking CA 19-9.   Reviewed with radiology. OK to proceed with MRI with known history of knee replacement.   Courtney: Please let patient know the above and arrange CA 19-9. Dx: Pancreatic lesion  Mindy:  Please arrange MRI/MRCP with pancreatic protocol ASAP. Dx: Pancreatic cyst, liver lesion.

## 2022-01-13 NOTE — Patient Instructions (Addendum)
Please remember to go to the lab department   for your tests - we will call you with the results when they are available.  Please remember to go to the  x-ray department  @  [facility] for your tests - we will call you with the results when they are available      Pantoprazole (protonix) 40 mg   Take  30-60 min before first meal of the day and Pepcid (famotidine)  20 mg after supper until return to office - this is the best way to tell whether stomach acid is contributing to your problem.     GERD (REFLUX)  is an extremely common cause of respiratory symptoms just like yours , many times with no obvious heartburn at all.    It can be treated with medication, but also with lifestyle changes including elevation of the head of your bed (ideally with 6 -8inch blocks under the headboard of your bed),  Smoking cessation, avoidance of late meals, excessive alcohol, and avoid fatty foods, chocolate, peppermint, colas, red wine, and acidic juices such as orange juice.  NO MINT OR MENTHOL PRODUCTS SO NO COUGH DROPS  USE SUGARLESS CANDY INSTEAD (Jolley ranchers or Stover's or Life Savers) or even ice chips will also do - the key is to swallow to prevent all throat clearing. NO OIL BASED VITAMINS - use powdered substitutes.  Avoid fish oil when coughing.    Please schedule a follow up office visit in 6 weeks, call sooner if needed

## 2022-01-13 NOTE — Telephone Encounter (Signed)
1st attempt to call pt to schedule TELE visit... LM to call back

## 2022-01-13 NOTE — Progress Notes (Signed)
[name], female    DOB: 26-Sep-1934    MRN: [id number]   Brief patient profile:  46  yowf never smoker with allergic rhinitis worse in spring referred to pulmonary clinic in [place]  01/13/2022 by Clyda Hurdle for sob > cough   Prior eval and rx by Dr Shearon Stalls for sob and cough x Nov 2020 assoc with pnds no better with prednisone and never saw allergy / initially otc antihistamines were helping  but not anymore    History of Present Illness  01/13/2022  Pulmonary/ 1st office eval/ Melvyn Novas / [facility]  Chief Complaint  Patient presents with   New Patient (Initial Visit)    Was seeing Dr. Warner Mccreedy in [facility] office. Patient states she was seeing him for cough and SOB.  Dyspnea:  assoc with cough since  Nov 2020 / says could only do "3 laps" at end of cardiac  rehab was supposed to do 6 - walking one side of house to the other ( rehab notes say she did 800 feet in 6 min with lowest sats 90% on 12/17/21 last session)  Cough: typically worse p walks/ very small amt mucoid sputum, worse with voice use  Sleep: on back/side / bed is flat does not wake up with excess mucus  SABA use: none   No obvious day to day or daytime pattern/variability or assoc excess/ purulent sputum or mucus plugs or hemoptysis or cp or chest tightness, subjective wheeze or overt sinus or hb symptoms.   Sleep without nocturnal  or early am exacerbation  of respiratory  c/o's or need for noct saba. Also denies any obvious fluctuation of symptoms with weather or environmental changes or other aggravating or alleviating factors except as outlined above   No unusual exposure hx or h/o childhood pna/ asthma or knowledge of premature birth.  Current Allergies, Complete Past Medical History, Past Surgical History, Family History, and Social History were reviewed in [employer] record.  ROS  The following are not active complaints unless bolded Hoarseness, sore throat, dysphagia, dental problems,  itching, sneezing,  nasal congestion or discharge of excess mucus or purulent secretions, ear ache,   fever, chills, sweats, unintended wt loss or wt gain, classically pleuritic or exertional cp,  orthopnea pnd or arm/hand swelling  or leg swelling, presyncope, palpitations, abdominal pain, anorexia, nausea, vomiting, diarrhea  or change in bowel habits or change in bladder habits, change in stools or change in urine, dysuria, hematuria,  rash, arthralgias, visual complaints, headache, numbness, weakness or ataxia or problems with walking/uses cane  or coordination,  change in mood or  memory.           Past Medical History:  Diagnosis Date   CAD (coronary artery disease)    a. s/p NSTEMI in 07/2021 with cath showing minimal CAD and no culprit lesion identified   Cramps, extremity    legs and hands   Diabetes ([place])    type II   Enterococcus UTI 02/03/2016   H/O cardiovascular stress test 03/05/2009   normal, no ischemia   H/O Doppler ultrasound    lower ext arterial doppler 04/13/09-no evidence of arterial insufficiency, normal values; distal aorta 2.4x2.7   HTN (hypertension)    Murmur, cardiac    echo 02/25/13- EF 60-65%, impaired relaxation-grade 1 diastolic dysfunction, mild concentric lvh, mild to moderate aortic regurgitation   Pneumonia    May 2017   Postoperative anemia due to acute blood loss 02/02/2016   Primary localized  osteoarthritis of right knee    TIA (transient ischemic attack)    carotid doppler 05/08/11-normal patency    Outpatient Medications Prior to Visit  Medication Sig Dispense Refill   aspirin EC 81 MG tablet Take 81 mg by mouth daily. Swallow whole.     atorvastatin (LIPITOR) 80 MG tablet Take 80 mg by mouth daily.     furosemide (LASIX) 20 MG tablet Take 1 tablet (20 mg total) by mouth daily. 90 tablet 3   glipiZIDE (GLUCOTROL XL) 5 MG 24 hr tablet Take 5 mg by mouth daily.     latanoprost (XALATAN) 0.005 % ophthalmic solution Place 1 drop into both eyes at  bedtime.     metoprolol succinate (TOPROL-XL) 25 MG 24 hr tablet Take 25 mg by mouth daily.     montelukast (SINGULAIR) 5 MG chewable tablet Chew 5 mg by mouth daily.     nitroGLYCERIN (NITROSTAT) 0.4 MG SL tablet Place 1 tablet (0.4 mg total) under the tongue every 5 (five) minutes as needed. 25 tablet 3   valsartan (DIOVAN) 160 MG tablet Take 1 tablet (160 mg total) by mouth daily. (Patient taking differently: Take 160 mg by mouth 2 (two) times daily.) 90 tablet 3   No facility-administered medications prior to visit.     Objective:     BP 130/84 (BP Location: Left Arm, Patient Position: Sitting)   Pulse 90   Temp 97.9 F (36.6 C) (Temporal)   Ht '5\' 8"'$  (1.727 m)   Wt 163 lb (73.9 kg)   SpO2 94% Comment: ra  BMI 24.78 kg/m   SpO2: 94 % (ra)  Slt hoarse with voice fatigue and throat clearing  crackles R > L base   HEENT : Oropharynx  clear      Nasal turbinates nl    NECK :  without  apparent JVD/ palpable Nodes/TM    LUNGS: no acc muscle use,  Nl contour chest which is clear to A and P bilaterally without cough on insp or exp maneuvers   CV:  RRR  no s3 or murmur or increase in P2, and no edema   ABD:  soft and nontender with nl inspiratory excursion in the supine position. No bruits or organomegaly appreciated   MS:  Nl gait/ ext warm without deformities Or obvious joint restrictions  calf tenderness, cyanosis or clubbing    SKIN: warm and dry without lesions    NEURO:  alert, approp, nl sensorium with  no motor or cerebellar deficits apparent.      I personally reviewed images and agree with radiology impression as follows:   Chest CT w/o contrast 12/10/21 Scattered areas of subpleural reticulation and ground-glass. Septal thickening at the lung bases with signs of bronchiectasis. No signs of honeycombing. Findings mention on previous imaging which is not currently available for review.    Labs ordered/ reviewed:      Chemistry      Component Value  Date/Time   NA 142 01/13/2022 1555   K 4.2 01/13/2022 1555   CL 99 01/13/2022 1555   CO2 22 01/13/2022 1555   BUN 36 (H) 01/13/2022 1555   CREATININE 1.72 (H) 01/13/2022 1555   CREATININE 1.25 (H) 03/01/2013 1034      Component Value Date/Time   CALCIUM 9.6 01/13/2022 1555   ALKPHOS 114 01/22/2016 1409   AST 24 01/22/2016 1409   ALT 18 01/22/2016 1409   BILITOT 0.9 01/22/2016 1409        Lab Results  Component Value Date   WBC 7.7 01/13/2022   HGB 12.6 01/13/2022   HCT 38.2 01/13/2022   MCV 90 01/13/2022   PLT 198 01/13/2022       EOS                                                               0.1                                  01/13/2022       Lab Results  Component Value Date   TSH 0.720 01/13/2022        Lab Results  Component Value Date   ESRSEDRATE 48 (H) 01/13/2022       CXR PA and Lateral:   01/13/2022 :    Did not go for cxr as rec     Assessment   DOE (dyspnea on exertion) Onset nov 2020 assoc with chronic PNDS  Worse in spring  -  Echo 07/27/21   The left ventricular size is normal.  There is normal left ventricular wall thickness.  Left ventricular systolic function is normal.  The right ventricle is normal in size and function.  The left atrial size is normal.  There is mild aortic regurgitation.  There is mild mitral regurgitation.  - LHC  07/18/21  Min CAD/ no pressures obtained   Chest CT w/o contrast 12/10/21 Scattered areas of subpleural reticulation and ground-glass. Septal thickening at the lung bases with signs of bronchiectasis. No signs of honeycombing. Findings mention on previous imaging which is not currently available for review. - 01/13/2022   Walked on RA  x  3  lap(s) =  approx 450  ft  @ slow/cane pace, stopped due to end of study with mild sob and Knee pain  with lowest 02 sats 92%    She has no significant ILD and likely longstanding bronchiectasis but mostly limted by conditioning and DJD at this point  - cough is not likely  related to bronchiectasis by the clinical pattern she describes so will focus on this first.   She does have impressive SEM on murmur but low  BNP and absence of orthopnea/ noct cough make cardiac asthma less likely here   Chronic cough Onset Nov 2020 assoc with sensation of pnds but not better on antihistamines or steroids -  Allergy screen 01/13/2022 >  Eos 0.1 /  IgE 256    Has never been seen by allergy but likely contributing to Upper airway cough syndrome (previously labeled PNDS),  is so named because it's frequently impossible to sort out how much is  CR/sinusitis with freq throat clearing (which can be related to primary GERD)   vs  causing  secondary (" extra esophageal")  GERD from wide swings in gastric pressure that occur with throat clearing, often  promoting self use of mint and menthol lozenges that reduce the lower esophageal sphincter tone and exacerbate the problem further in a cyclical fashion.   These are the same pts (now being labeled as having "irritable larynx syndrome" by some cough centers) who not infrequently have a history of having failed to tolerate ace inhibitors,  dry powder inhalers or biphosphonates or report  having atypical/extraesophageal reflux symptoms that don't respond to standard doses of PPI  and are easily confused as having aecopd or asthma flares by even experienced allergists/ pulmonologists (myself included).    rec  Max gerd rx Refer to allergy  F/u in 6 weeks    Each maintenance medication was reviewed in detail including emphasizing most importantly the difference between maintenance and prns and under what circumstances the prns are to be triggered using an action plan format where appropriate.  Total time for H and P, chart review, counseling,  directly observing portions of ambulatory 02 saturation study/ and generating customized AVS unique to this office visit / same day charting  >  40 min with pt new to me                   Christinia Gully, MD 01/13/2022

## 2022-01-13 NOTE — Telephone Encounter (Signed)
Attention: Preop   We would like to request cardiac clearance for the following patient please.  Procedure: Upper Endoscopy with possible dilation   Date: TBD  Surgeon:  Dr. Abbey Chatters   Phone: [phone]  Fax:  [phone]  Type of Anesthesia:  Propofol   ASA III

## 2022-01-13 NOTE — Patient Instructions (Addendum)
We will arrange for you to have an upper endoscopy with possible stretching of your esophagus in the near future with Dr. Abbey Chatters.  Swallowing precautions:  Eat slowly, take small bites, chew thoroughly, drink plenty of liquids throughout meals.  Avoid trough textures All meats should be chopped finely.  If something gets hung in your esophagus and will not come up or go down, proceed to the emergency room.    I am going to reach out to radiology to determine if he can have an MRI to further evaluate your pancreatic lesion.  You should receive a call back from me later today or tomorrow regarding this.  For chronic constipation, resume Colace (docusate sodium) 100-200 mg daily.  If you continue to have problems with constipation, please let me know.  We will see you back after your upper endoscopy.   It was good to meet you today!   Aliene Altes, PA-C [facility]

## 2022-01-13 NOTE — Telephone Encounter (Signed)
Sent cardiac clearance for procedure. Waiting on approval.

## 2022-01-14 ENCOUNTER — Other Ambulatory Visit: Payer: Self-pay | Admitting: *Deleted

## 2022-01-14 ENCOUNTER — Other Ambulatory Visit: Payer: Self-pay | Admitting: Gastroenterology

## 2022-01-14 DIAGNOSIS — R634 Abnormal weight loss: Secondary | ICD-10-CM

## 2022-01-14 DIAGNOSIS — K862 Cyst of pancreas: Secondary | ICD-10-CM

## 2022-01-14 DIAGNOSIS — C772 Secondary and unspecified malignant neoplasm of intra-abdominal lymph nodes: Secondary | ICD-10-CM

## 2022-01-14 DIAGNOSIS — K869 Disease of pancreas, unspecified: Secondary | ICD-10-CM

## 2022-01-14 NOTE — Telephone Encounter (Signed)
Left message x 2 for the pt to call back to set up a tele visit.

## 2022-01-14 NOTE — Progress Notes (Signed)
error 

## 2022-01-14 NOTE — Telephone Encounter (Signed)
Spoke to pt informed, her of results and recommendations. Pt voiced understanding. Informed pt to have labs completed.

## 2022-01-14 NOTE — Addendum Note (Signed)
Addended by: Inda Castle on: 01/14/2022 11:14 AM   Modules accepted: Orders

## 2022-01-14 NOTE — Addendum Note (Signed)
Addended by: Cheron Every on: 01/14/2022 09:17 AM   Modules accepted: Orders

## 2022-01-14 NOTE — Telephone Encounter (Signed)
Diagnose code pancreatic lesion would not work for CA 19-9.

## 2022-01-14 NOTE — Telephone Encounter (Signed)
Reviewed.  I also tried pancreatic cyst and weight loss, but these diagnoses also did not cover CA 19-9.  Estimated cost of the lab is $50.  At this point, would recommend holding off until we have MRI completed.  We will have further recommendations thereafter.   Loma Sousa, please update patient.

## 2022-01-14 NOTE — Telephone Encounter (Signed)
Called pt and she is aware of MRI appt details for 7/18, arrival 3:30pm, npo 4 hrs prior. She voiced understanding

## 2022-01-16 ENCOUNTER — Encounter: Payer: Self-pay | Admitting: Internal Medicine

## 2022-01-16 LAB — CBC WITH DIFFERENTIAL/PLATELET
Basophils Absolute: 0.2 10*3/uL (ref 0.0–0.2)
Basos: 2 %
EOS (ABSOLUTE): 0.1 10*3/uL (ref 0.0–0.4)
Eos: 2 %
Hematocrit: 38.2 % (ref 34.0–46.6)
Hemoglobin: 12.6 g/dL (ref 11.1–15.9)
Immature Grans (Abs): 0 10*3/uL (ref 0.0–0.1)
Immature Granulocytes: 1 %
Lymphocytes Absolute: 1.8 10*3/uL (ref 0.7–3.1)
Lymphs: 24 %
MCH: 29.6 pg (ref 26.6–33.0)
MCHC: 33 g/dL (ref 31.5–35.7)
MCV: 90 fL (ref 79–97)
Monocytes Absolute: 0.9 10*3/uL (ref 0.1–0.9)
Monocytes: 11 %
Neutrophils Absolute: 4.7 10*3/uL (ref 1.4–7.0)
Neutrophils: 60 %
Platelets: 198 10*3/uL (ref 150–450)
RBC: 4.26 x10E6/uL (ref 3.77–5.28)
RDW: 13.4 % (ref 11.7–15.4)
WBC: 7.7 10*3/uL (ref 3.4–10.8)

## 2022-01-16 LAB — TSH: TSH: 0.72 u[IU]/mL (ref 0.450–4.500)

## 2022-01-16 LAB — BASIC METABOLIC PANEL
BUN/Creatinine Ratio: 21 (ref 12–28)
BUN: 36 mg/dL — ABNORMAL HIGH (ref 8–27)
CO2: 22 mmol/L (ref 20–29)
Calcium: 9.6 mg/dL (ref 8.7–10.3)
Chloride: 99 mmol/L (ref 96–106)
Creatinine, Ser: 1.72 mg/dL — ABNORMAL HIGH (ref 0.57–1.00)
Glucose: 149 mg/dL — ABNORMAL HIGH (ref 70–99)
Potassium: 4.2 mmol/L (ref 3.5–5.2)
Sodium: 142 mmol/L (ref 134–144)
eGFR: 29 mL/min/{1.73_m2} — ABNORMAL LOW (ref >59)

## 2022-01-16 LAB — SEDIMENTATION RATE: Sed Rate: 48 mm/hr — ABNORMAL HIGH (ref 0–40)

## 2022-01-16 LAB — BRAIN NATRIURETIC PEPTIDE: BNP: 92.3 pg/mL (ref 0.0–100.0)

## 2022-01-16 LAB — IGE: IgE (Immunoglobulin E), Serum: 256 IU/mL (ref 6–495)

## 2022-01-16 NOTE — Assessment & Plan Note (Signed)
Onset Nov 2020 assoc with sensation of pnds but not better on antihistamines or steroids -  Allergy screen 01/13/2022 >  Eos 0.1 /  IgE 256    Has never been seen by allergy but likely contributing to Upper airway cough syndrome (previously labeled PNDS),  is so named because it's frequently impossible to sort out how much is  CR/sinusitis with freq throat clearing (which can be related to primary GERD)   vs  causing  secondary (" extra esophageal")  GERD from wide swings in gastric pressure that occur with throat clearing, often  promoting self use of mint and menthol lozenges that reduce the lower esophageal sphincter tone and exacerbate the problem further in a cyclical fashion.   These are the same pts (now being labeled as having "irritable larynx syndrome" by some cough centers) who not infrequently have a history of having failed to tolerate ace inhibitors,  dry powder inhalers or biphosphonates or report having atypical/extraesophageal reflux symptoms that don't respond to standard doses of PPI  and are easily confused as having aecopd or asthma flares by even experienced allergists/ pulmonologists (myself included).    rec  Max gerd rx Refer to allergy  F/u in 6 weeks    Each maintenance medication was reviewed in detail including emphasizing most importantly the difference between maintenance and prns and under what circumstances the prns are to be triggered using an action plan format where appropriate.  Total time for H and P, chart review, counseling,  directly observing portions of ambulatory 02 saturation study/ and generating customized AVS unique to this office visit / same day charting  >  40 min with pt new to me

## 2022-01-16 NOTE — Assessment & Plan Note (Addendum)
Onset nov 2020 assoc with chronic PNDS  Worse in spring  -  Echo 07/27/21   The left ventricular size is normal.  There is normal left ventricular wall thickness.  Left ventricular systolic function is normal.  The right ventricle is normal in size and function.  The left atrial size is normal.  There is mild aortic regurgitation.  There is mild mitral regurgitation.  - LHC  07/18/21  Min CAD/ no pressures obtained   Chest CT w/o contrast 12/10/21 Scattered areas of subpleural reticulation and ground-glass. Septal thickening at the lung bases with signs of bronchiectasis. No signs of honeycombing. Findings mention on previous imaging which is not currently available for review. - 01/13/2022   Walked on RA  x  3  lap(s) =  approx 450  ft  @ slow/cane pace, stopped due to end of study with mild sob and Knee pain  with lowest 02 sats 92%    She has no significant ILD and likely longstanding bronchiectasis but mostly limted by conditioning and DJD at this point  - cough is not likely related to bronchiectasis by the clinical pattern she describes so will focus on this first.   She does have impressive SEM on murmur but low  BNP and absence of orthopnea/ noct cough make cardiac asthma less likely here

## 2022-01-17 NOTE — Telephone Encounter (Signed)
Spoke to pt, informed not to have blood work done this week. Pt voiced understanding.

## 2022-01-18 ENCOUNTER — Telehealth: Payer: Self-pay | Admitting: *Deleted

## 2022-01-18 NOTE — Telephone Encounter (Signed)
Pt agreeable to plan of care for tele visit 01/24/22 @ 2 pm. Med rec and consent are done. Pt was not sure about the directions of her Valsartan, so she said she will confirm with the pre op provider Monday 01/24/22.

## 2022-01-18 NOTE — Telephone Encounter (Signed)
Pt agreeable to plan of care for tele visit 01/24/22 @ 2 pm. Med rec and consent are done. Pt was not sure about the directions of her Valsartan, so she said she will confirm with the pre op provider Monday 01/24/22.     Patient Consent for Virtual Visit        [name] has provided verbal consent on 01/18/2022 for a virtual visit (video or telephone).   CONSENT FOR VIRTUAL VISIT FOR:  [name]  By participating in this virtual visit I agree to the following:  I hereby voluntarily request, consent and authorize [facility] [place] and its employed or contracted physicians, physician assistants, nurse practitioners or other licensed health care professionals (the Practitioner), to provide me with telemedicine health care services (the "Services") as deemed necessary by the treating Practitioner. I acknowledge and consent to receive the Services by the Practitioner via telemedicine. I understand that the telemedicine visit will involve communicating with the Practitioner through live audiovisual communication technology and the disclosure of certain medical information by electronic transmission. I acknowledge that I have been given the opportunity to request an in-person assessment or other available alternative prior to the telemedicine visit and am voluntarily participating in the telemedicine visit.  I understand that I have the right to withhold or withdraw my consent to the use of telemedicine in the course of my care at any time, without affecting my right to future care or treatment, and that the Practitioner or I may terminate the telemedicine visit at any time. I understand that I have the right to inspect all information obtained and/or recorded in the course of the telemedicine visit and may receive copies of available information for a reasonable fee.  I understand that some of the potential risks of receiving the Services via telemedicine include:  Delay or interruption in  medical evaluation due to technological equipment failure or disruption; Information transmitted may not be sufficient (e.g. poor resolution of images) to allow for appropriate medical decision making by the Practitioner; and/or  In rare instances, security protocols could fail, causing a breach of personal health information.  Furthermore, I acknowledge that it is my responsibility to provide information about my medical history, conditions and care that is complete and accurate to the best of my ability. I acknowledge that Practitioner's advice, recommendations, and/or decision may be based on factors not within their control, such as incomplete or inaccurate data provided by me or distortions of diagnostic images or specimens that may result from electronic transmissions. I understand that the practice of medicine is not an exact science and that Practitioner makes no warranties or guarantees regarding treatment outcomes. I acknowledge that a copy of this consent can be made available to me via my patient portal ([facility]), or I can request a printed copy by calling the office of [facility].    I understand that my insurance will be billed for this visit.   I have read or had this consent read to me. I understand the contents of this consent, which adequately explains the benefits and risks of the Services being provided via telemedicine.  I have been provided ample opportunity to ask questions regarding this consent and the Services and have had my questions answered to my satisfaction. I give my informed consent for the services to be provided through the use of telemedicine in my medical care

## 2022-01-19 ENCOUNTER — Telehealth: Payer: Self-pay | Admitting: Gastroenterology

## 2022-01-19 NOTE — Telephone Encounter (Signed)
Received and reviewed colonoscopy records from [facility].  Patient had colonoscopy with Dr. Cristina Gong 04/29/2010.  Indication was for personal history of colon polyps.  She was found to have diverticulosis in the entire colon, otherwise normal exam.  Recommended repeat colonoscopy in 5 years for surveillance.  We requested records just to have in our database. Would not recommend repeat colonoscopy at her current age of 79 unless she were to develop alarm symptoms.  No additional recommendations at this time.

## 2022-01-24 ENCOUNTER — Telehealth: Payer: Self-pay | Admitting: *Deleted

## 2022-01-24 ENCOUNTER — Ambulatory Visit (INDEPENDENT_AMBULATORY_CARE_PROVIDER_SITE_OTHER): Payer: Medicare Other | Admitting: Physician Assistant

## 2022-01-24 DIAGNOSIS — Z01810 Encounter for preprocedural cardiovascular examination: Secondary | ICD-10-CM | POA: Diagnosis not present

## 2022-01-24 NOTE — Progress Notes (Signed)
Virtual Visit via Telephone Note   Because of [name]'s co-morbid illnesses, she is at least at moderate risk for complications without adequate follow up.  This format is felt to be most appropriate for this patient at this time.  The patient did not have access to video technology/had technical difficulties with video requiring transitioning to audio format only (telephone).  All issues noted in this document were discussed and addressed.  No physical exam could be performed with this format.  Please refer to the patient's chart for her consent to telehealth for [facility].  Evaluation Performed:  Preoperative cardiovascular risk assessment _____________   Date:  01/24/2022   Patient ID:  [name], DOB 1934-09-04, MRN [id number] Patient Location:  Home Provider location:   Office  Primary Care Provider:  Glenda Chroman, MD Primary Cardiologist:  Werner Lean, MD  Chief Complaint / Patient Profile   87 y.o. y/o female with a h/o of CAD (s/p NSTEMI in 07/2021 with cath showing minimal CAD and no culprit lesion identified), HTN, HLD, Type 2 DM and prior TIA  who is pending Upper Endoscopy with possible dilation  and presents today for telephonic preoperative cardiovascular risk assessment.  Past Medical History    Past Medical History:  Diagnosis Date   CAD (coronary artery disease)    a. s/p NSTEMI in 07/2021 with cath showing minimal CAD and no culprit lesion identified   Cramps, extremity    legs and hands   Diabetes ([place])    type II   Enterococcus UTI 02/03/2016   H/O cardiovascular stress test 03/05/2009   normal, no ischemia   H/O Doppler ultrasound    lower ext arterial doppler 04/13/09-no evidence of arterial insufficiency, normal values; distal aorta 2.4x2.7   HTN (hypertension)    Murmur, cardiac    echo 02/25/13- EF 60-65%, impaired relaxation-grade 1 diastolic dysfunction, mild concentric lvh, mild to moderate aortic regurgitation    Pneumonia    May 2017   Postoperative anemia due to acute blood loss 02/02/2016   Primary localized osteoarthritis of right knee    TIA (transient ischemic attack)    carotid doppler 05/08/11-normal patency   Past Surgical History:  Procedure Laterality Date   ABDOMINAL HYSTERECTOMY     BUNIONECTOMY Left    COLONOSCOPY     TOTAL KNEE ARTHROPLASTY Right 02/01/2016   Procedure: TOTAL KNEE ARTHROPLASTY;  Surgeon: Elsie Saas, MD;  Location: [facility];  Service: Orthopedics;  Laterality: Right;    Allergies  Allergies  Allergen Reactions   Codeine Nausea Only   Milk-Related Compounds Diarrhea    History of Present Illness    [name] is a 87 y.o. female who presents via audio/video conferencing for a telehealth visit today.  Pt was last seen in cardiology clinic on 11/25/21 by Bernerd Pho, [place].  At that time [name] was doing well .  The patient is now pending procedure as outlined above. Since her last visit, she well.  Patient is scheduled for MRCP tomorrow.  This appointment made for upper endoscopy with possible dilatation clearance.  Patient is unaware of any upcoming endoscopy.  She denies chest pain, shortness of breath, orthopnea, PND, syncope or lower extremity edema.  Does household chores without any significant problem.   Home Medications    Prior to Admission medications   Medication Sig Start Date End Date Taking? Authorizing Provider  aspirin EC 81 MG tablet Take 81 mg by mouth daily. Swallow whole.  [provider]  atorvastatin (LIPITOR) 80 MG tablet Take 80 mg by mouth daily. 08/17/21   [provider]  famotidine (PEPCID) 20 MG tablet One after supper 01/13/22   Tanda Rockers, MD  furosemide (LASIX) 20 MG tablet Take 1 tablet (20 mg total) by mouth daily. 09/15/21   Chandrasekhar, Mahesh A, MD  glipiZIDE (GLUCOTROL XL) 5 MG 24 hr tablet Take 5 mg by mouth daily. 10/08/14   [provider]  latanoprost (XALATAN)  0.005 % ophthalmic solution Place 1 drop into both eyes at bedtime. 09/13/21   [provider]  metoprolol succinate (TOPROL-XL) 25 MG 24 hr tablet Take 25 mg by mouth daily. 09/13/21   [provider]  montelukast (SINGULAIR) 5 MG chewable tablet Chew 5 mg by mouth daily. 09/13/21   [provider]  nitroGLYCERIN (NITROSTAT) 0.4 MG SL tablet Place 1 tablet (0.4 mg total) under the tongue every 5 (five) minutes as needed. 09/08/21   Chandrasekhar, Mahesh A, MD  pantoprazole (PROTONIX) 40 MG tablet Take 1 tablet (40 mg total) by mouth daily. Take 30-60 min before first meal of the day 01/13/22   Tanda Rockers, MD  valsartan (DIOVAN) 160 MG tablet Take 1 tablet (160 mg total) by mouth daily. Patient taking differently: Take 160 mg by mouth 2 (two) times daily. 09/15/21   Werner Lean, MD    Physical Exam    Vital Signs:  [name] does not have vital signs available for review today  Given telephonic nature of communication, physical exam is limited. AAOx3. NAD. Normal affect.  Speech and respirations are unlabored.  Accessory Clinical Findings    None  Assessment & Plan    1.  Preoperative Cardiovascular Risk Assessment: Primary Cardiologist: Werner Lean, MD  Chart reviewed as part of pre-operative protocol coverage. Given past medical history and time since last visit, based on ACC/AHA guidelines, [name] would be at acceptable risk for the planned procedure without further cardiovascular testing.   The patient was advised that if she develops new symptoms prior to surgery to contact our office to arrange for a follow-up visit, and she verbalized understanding.  I will route this recommendation to the requesting party via Epic fax function and remove from pre-op pool.  Please call with questions.   A copy of this note will be routed to requesting surgeon.  Time:   Today, I have spent 9 minutes with the patient with  telehealth technology discussing medical history, symptoms, and management plan.     [place], [place]  01/24/2022, 2:24 PM

## 2022-01-24 NOTE — Telephone Encounter (Signed)
  [name], [place]  Certified Medical Assistant Telephone Encounter Signed Encounter Date:  01/13/2022   Signed                      Attention: Preop     We would like to request cardiac clearance for the following patient please.   Procedure: Upper Endoscopy with possible dilation    Date: TBD   Surgeon:  Dr. Abbey Chatters    Phone: [phone]   Fax:  [phone]   Type of Anesthesia:  Propofol   ASA III

## 2022-01-25 ENCOUNTER — Ambulatory Visit (HOSPITAL_COMMUNITY)
Admission: RE | Admit: 2022-01-25 | Discharge: 2022-01-25 | Disposition: A | Discharge status: Home / Self Care | Payer: Medicare Other | Source: Ambulatory Visit | Attending: Gastroenterology | Admitting: Gastroenterology

## 2022-01-25 DIAGNOSIS — K769 Liver disease, unspecified: Secondary | ICD-10-CM | POA: Insufficient documentation

## 2022-01-25 DIAGNOSIS — K862 Cyst of pancreas: Secondary | ICD-10-CM | POA: Insufficient documentation

## 2022-01-25 MED ORDER — GADOBUTROL 1 MMOL/ML IV SOLN
7.0000 mL | Freq: Once | INTRAVENOUS | Status: AC | PRN
  Administered 2022-01-25: 7 mL via INTRAVENOUS

## 2022-01-25 NOTE — Telephone Encounter (Signed)
Received cardiac clearance. Copy on providers desk.

## 2022-01-25 NOTE — Telephone Encounter (Signed)
Thank you :)

## 2022-01-25 NOTE — Telephone Encounter (Signed)
Is pt cleared to have procedure?

## 2022-01-27 ENCOUNTER — Encounter: Payer: Self-pay | Admitting: *Deleted

## 2022-01-27 NOTE — Telephone Encounter (Signed)
Called pt. She has been scheduled for 8/11 at 9:30am. Aware will mail instructions/pre-op appt.

## 2022-01-27 NOTE — Telephone Encounter (Signed)
Reviewed.   Mindy. Please proceed with scheduling EGD +/- dilation with Dr. Abbey Chatters as planned.

## 2022-02-01 ENCOUNTER — Other Ambulatory Visit: Payer: Self-pay

## 2022-02-01 DIAGNOSIS — C259 Malignant neoplasm of pancreas, unspecified: Secondary | ICD-10-CM

## 2022-02-01 DIAGNOSIS — K869 Disease of pancreas, unspecified: Secondary | ICD-10-CM

## 2022-02-01 DIAGNOSIS — K862 Cyst of pancreas: Secondary | ICD-10-CM

## 2022-02-15 ENCOUNTER — Other Ambulatory Visit (HOSPITAL_COMMUNITY): Payer: Medicare Other

## 2022-02-18 ENCOUNTER — Encounter (HOSPITAL_COMMUNITY): Payer: Self-pay

## 2022-02-18 ENCOUNTER — Ambulatory Visit (HOSPITAL_COMMUNITY): Admit: 2022-02-18 | Payer: Medicare Other

## 2022-02-18 SURGERY — ESOPHAGOGASTRODUODENOSCOPY (EGD) WITH PROPOFOL
Anesthesia: Monitor Anesthesia Care

## 2022-02-23 ENCOUNTER — Ambulatory Visit (INDEPENDENT_AMBULATORY_CARE_PROVIDER_SITE_OTHER): Payer: Medicare HMO | Admitting: Internal Medicine

## 2022-02-23 ENCOUNTER — Encounter: Payer: Self-pay | Admitting: Internal Medicine

## 2022-02-23 DIAGNOSIS — R0609 Other forms of dyspnea: Secondary | ICD-10-CM

## 2022-02-23 DIAGNOSIS — R053 Chronic cough: Secondary | ICD-10-CM

## 2022-02-23 NOTE — Assessment & Plan Note (Signed)
Onset nov 2020 (? covid related)  assoc with chronic PNDS  Worse in spring  -  Echo 07/27/21   The left ventricular size is normal.  There is normal left ventricular wall thickness.  Left ventricular systolic function is normal.  The right ventricle is normal in size and function.  The left atrial size is normal.  There is mild aortic regurgitation.  There is mild mitral regurgitation.  - LHC  07/18/21  Min CAD/ no pressures obtained  - 01/13/2022   Walked on RA  x  3  lap(s) =  approx 450  ft  @ slow/cane pace, stopped due to end of study with mild sob and Knee pain  with lowest 02 sats 92%    Improved activity to since last ov/ rec increasing paced ex as tol and monitor sats at peak ex   Please schedule a follow up visit in 3 months but call sooner if needed

## 2022-02-23 NOTE — Assessment & Plan Note (Addendum)
Onset Nov 2020 assoc with sensation of pnds but not better on antihistamines or steroids - assoc bronchiectasis on CT 12/10/21  - Allergy screen 01/13/2022 >  Eos 0.1 /  IgE 256  > referred to allergy 02/23/2022 >>>   Cough is not typical of bronchiectasis as occurs end insp and dates back to likely covid infection Nov 2020 with more of a pattern of  Upper airway cough syndrome (previously labeled PNDS),  is so named because it's frequently impossible to sort out how much is  CR/sinusitis with freq throat clearing (which can be related to primary GERD)   vs  causing  secondary (" extra esophageal")  GERD from wide swings in gastric pressure that occur with throat clearing, often  promoting self use of mint and menthol lozenges that reduce the lower esophageal sphincter tone and exacerbate the problem further in a cyclical fashion.   These are the same pts (now being labeled as having "irritable larynx syndrome" by some cough centers) who not infrequently have a history of having failed to tolerate ace inhibitors,  dry powder inhalers or biphosphonates or report having atypical/extraesophageal reflux symptoms that don't respond to standard doses of PPI  and are easily confused as having aecopd or asthma flares by even experienced allergists/ pulmonologists (myself included).   rec: Advised to avoid cough drops, elevate hob on bed blocks and add zyrtec 10 mg an hour before bed  Refer to allergy for eval of allergic rhintis/ pnds          Each maintenance medication was reviewed in detail including emphasizing most importantly the difference between maintenance and prns and under what circumstances the prns are to be triggered using an action plan format where appropriate.  Total time for H and P, chart review, counseling  and generating customized AVS unique to this office visit / same day charting  > 30 min for multiple  refractory respiratory  symptoms of uncertain etiology

## 2022-02-23 NOTE — Patient Instructions (Signed)
Make sure you check your oxygen saturation at your highest level of activity to be sure it stays over 90% and keep track of it at least once a week, more often if breathing getting worse, and let me know if losing ground.   Add zyrtec 10 mg an hour before bed (can take it same time famotidine)   My office will be contacting you by phone for referral to Allergy   - if you don't hear back from my office within one week please call us back or notify us thru MyChart and we'll address it right away.   GERD (REFLUX)  is an extremely common cause of respiratory symptoms just like yours , many times with no obvious heartburn at all.    It can be treated with medication, but also with lifestyle changes including elevation of the head of your bed (ideally with 6 -8inch blocks under the headboard of your bed),  Smoking cessation, avoidance of late meals, excessive alcohol, and avoid fatty foods, chocolate, peppermint, colas, red wine, and acidic juices such as orange juice.  NO MINT OR MENTHOL PRODUCTS SO NO COUGH DROPS  USE SUGARLESS CANDY INSTEAD (Jolley ranchers or Stover's or Life Savers) or even ice chips will also do - the key is to swallow to prevent all throat clearing. NO OIL BASED VITAMINS - use powdered substitutes.  Avoid fish oil when coughing.    Please schedule a follow up visit in 3 months but call sooner if needed

## 2022-02-23 NOTE — Progress Notes (Addendum)
[name], female    DOB: 15-Jan-1935    MRN: [id number]   Brief patient profile:  73 yowf never smoker with allergic rhinitis worse in spring referred to pulmonary clinic in [place]  01/13/2022 by Clyda Hurdle for sob > cough   Prior eval and rx by Dr Shearon Stalls for sob and cough x Nov 2020 (says with covid, not admitted)  assoc with pnds no better with prednisone and never saw allergy / initially otc antihistamines were helping  but not anymore    History of Present Illness  01/13/2022  Pulmonary/ 1st office eval/ Melvyn Novas / [facility]  Chief Complaint  Patient presents with   New Patient (Initial Visit)    Was seeing Dr. Warner Mccreedy in [facility] office. Patient states she was seeing him for cough and SOB.  Dyspnea:  assoc with cough since  Nov 2020 / says could only do "3 laps" at end of cardiac  rehab was supposed to do 6 - walking one side of house to the other (rehab notes say she did 800 feet in 6 min with lowest sats 90% on 12/17/21 last session)  Cough: typically worse p walks/ very small amt mucoid sputum, worse with voice use  Sleep: on back/side / bed is flat does not wake up with excess mucus  SABA use: none   Rec Pantoprazole (protonix) 40 mg   Take  30-60 min before first meal of the day and Pepcid (famotidine)  20 mg after supper  GERD  diet/bed blocks Labs: Eos 0.1 /  IgE 256  > referred to allergy > does not recall being referred)  Esr  48      02/23/2022  f/u ov/[place] office/Blia Totman re: onset doe Nov 2020 p covid / bronchiectasis on CT   maint on gerd rx   Chief Complaint  Patient presents with   Follow-up    Breathing and cough are about the same since last ov  Having a lot of foot pain in right foot.   Dyspnea:  now  pushing buggy at [employer], using [facility] parking once a week  Cough: some cough at hs but does not keep her up, some worse p stirring in am/ with deep breaths or signing  / still using cough drops  Allegra didn't help  Sleeping: bed is flat with pillows   SABA use: none  02: none  Covid status: x 4 vax     No obvious day to day or daytime variability or assoc excess/ purulent sputum or mucus plugs or hemoptysis or cp or chest tightness, subjective wheeze or overt sinus or hb symptoms.   Sleeping  without nocturnal  or early am exacerbation  of respiratory  c/o's or need for noct saba. Also denies any obvious fluctuation of symptoms with weather or environmental changes or other aggravating or alleviating factors except as outlined above   No unusual exposure hx or h/o childhood pna/ asthma or knowledge of premature birth.  Current Allergies, Complete Past Medical History, Past Surgical History, Family History, and Social History were reviewed in [employer] record.  ROS  The following are not active complaints unless bolded Hoarseness, sore throat, dysphagia, dental problems, itching, sneezing,  nasal congestion or discharge of sense of excess mucus or purulent secretions, ear ache,   fever, chills, sweats, unintended wt loss or wt gain, classically pleuritic or exertional cp,  orthopnea pnd or arm/hand swelling  or leg swelling, presyncope, palpitations, abdominal pain, anorexia, nausea, vomiting, diarrhea  or change in bowel habits or change in bladder habits, change in stools or change in urine, dysuria, hematuria,  rash, arthralgias, visual complaints, headache, numbness, weakness or ataxia or problems with walking or coordination,  change in mood or  memory.        Current Meds  Medication Sig   aspirin EC 81 MG tablet Take 81 mg by mouth daily. Swallow whole.   atorvastatin (LIPITOR) 80 MG tablet Take 80 mg by mouth daily.   famotidine (PEPCID) 20 MG tablet One after supper   furosemide (LASIX) 20 MG tablet Take 1 tablet (20 mg total) by mouth daily.   glipiZIDE (GLUCOTROL XL) 5 MG 24 hr tablet Take 5 mg by mouth daily.   latanoprost (XALATAN) 0.005 % ophthalmic solution Place 1 drop into both eyes at bedtime.    LORATADINE PO Take by mouth.   montelukast (SINGULAIR) 5 MG chewable tablet Chew 5 mg by mouth daily.   nitroGLYCERIN (NITROSTAT) 0.4 MG SL tablet Place 1 tablet (0.4 mg total) under the tongue every 5 (five) minutes as needed.   pantoprazole (PROTONIX) 40 MG tablet Take 1 tablet (40 mg total) by mouth daily. Take 30-60 min before first meal of the day   valsartan (DIOVAN) 160 MG tablet Take 1 tablet (160 mg total) by mouth daily. (Patient taking differently: Take 160 mg by mouth 2 (two) times daily.)                 Past Medical History:  Diagnosis Date   CAD (coronary artery disease)    a. s/p NSTEMI in 07/2021 with cath showing minimal CAD and no culprit lesion identified   Cramps, extremity    legs and hands   Diabetes ([place])    type II   Enterococcus UTI 02/03/2016   H/O cardiovascular stress test 03/05/2009   normal, no ischemia   H/O Doppler ultrasound    lower ext arterial doppler 04/13/09-no evidence of arterial insufficiency, normal values; distal aorta 2.4x2.7   HTN (hypertension)    Murmur, cardiac    echo 02/25/13- EF 60-65%, impaired relaxation-grade 1 diastolic dysfunction, mild concentric lvh, mild to moderate aortic regurgitation   Pneumonia    May 2017   Postoperative anemia due to acute blood loss 02/02/2016   Primary localized osteoarthritis of right knee    TIA (transient ischemic attack)    carotid doppler 05/08/11-normal patency        Objective:      Wt Readings from Last 3 Encounters:  02/23/22 163 lb 12.8 oz (74.3 kg)  01/13/22 163 lb (73.9 kg)  01/13/22 163 lb (73.9 kg)      Vital signs reviewed  02/23/2022  - Note at rest 02 sats  95% on RA   General appearance:    amb (slow pace/cane) wf with freq throat clearing   HEENT : Oropharynx  clear/ no cobblestoning or excessive pnd     Nasal turbinates nl    NECK :  without  apparent JVD/ palpable Nodes/TM    LUNGS: no acc muscle use,  Nl contour chest with minimal insp rhonchi  bilateral  bases  without cough on insp or exp maneuvers   CV:  RRR  no s3 or murmur or increase in P2, and no edema   ABD:  soft and nontender with nl inspiratory excursion in the supine position. No bruits or organomegaly appreciated   MS:   ext warm without deformities Or obvious joint restrictions  calf tenderness, cyanosis or  clubbing    SKIN: warm and dry without lesions    NEURO:  alert, approp, nl sensorium with  no motor or cerebellar deficits apparent.        I personally reviewed images and agree with radiology impression as follows:   Chest CT w/o contrast 12/10/21 Scattered areas of subpleural reticulation and ground-glass. Septal thickening at the lung bases with signs of bronchiectasis. No signs of honeycombing. Findings mention on previous imaging which is not currently available for review.    Labs ordered/ reviewed:      Chemistry      Component Value Date/Time   NA 142 01/13/2022 1555   K 4.2 01/13/2022 1555   CL 99 01/13/2022 1555   CO2 22 01/13/2022 1555   BUN 36 (H) 01/13/2022 1555   CREATININE 1.72 (H) 01/13/2022 1555   CREATININE 1.25 (H) 03/01/2013 1034      Component Value Date/Time   CALCIUM 9.6 01/13/2022 1555   ALKPHOS 114 01/22/2016 1409   AST 24 01/22/2016 1409   ALT 18 01/22/2016 1409   BILITOT 0.9 01/22/2016 1409         Lab Results  Component Value Date   ESRSEDRATE 48 (H) 01/13/2022           Assessment

## 2022-02-24 DIAGNOSIS — E1142 Type 2 diabetes mellitus with diabetic polyneuropathy: Secondary | ICD-10-CM | POA: Diagnosis not present

## 2022-02-24 DIAGNOSIS — B351 Tinea unguium: Secondary | ICD-10-CM | POA: Diagnosis not present

## 2022-02-24 DIAGNOSIS — M79676 Pain in unspecified toe(s): Secondary | ICD-10-CM | POA: Diagnosis not present

## 2022-03-01 ENCOUNTER — Telehealth: Payer: Self-pay | Admitting: Internal Medicine

## 2022-03-01 NOTE — Telephone Encounter (Signed)
No notes or records that I can see where patient was contacted. Called and  notified patient that I was unsure who tried to call her and she voiced understanding. Nothing further needed at this time.

## 2022-03-04 DIAGNOSIS — Z299 Encounter for prophylactic measures, unspecified: Secondary | ICD-10-CM | POA: Diagnosis not present

## 2022-03-04 DIAGNOSIS — M10071 Idiopathic gout, right ankle and foot: Secondary | ICD-10-CM | POA: Diagnosis not present

## 2022-03-04 DIAGNOSIS — D692 Other nonthrombocytopenic purpura: Secondary | ICD-10-CM | POA: Diagnosis not present

## 2022-03-04 DIAGNOSIS — I700 Atherosclerosis of aorta: Secondary | ICD-10-CM | POA: Diagnosis not present

## 2022-03-04 DIAGNOSIS — I10 Essential (primary) hypertension: Secondary | ICD-10-CM | POA: Diagnosis not present

## 2022-03-04 DIAGNOSIS — E1165 Type 2 diabetes mellitus with hyperglycemia: Secondary | ICD-10-CM | POA: Diagnosis not present

## 2022-03-09 DIAGNOSIS — I10 Essential (primary) hypertension: Secondary | ICD-10-CM | POA: Diagnosis not present

## 2022-03-09 DIAGNOSIS — E1165 Type 2 diabetes mellitus with hyperglycemia: Secondary | ICD-10-CM | POA: Diagnosis not present

## 2022-03-24 ENCOUNTER — Encounter (HOSPITAL_COMMUNITY): Payer: Self-pay | Admitting: Gastroenterology

## 2022-03-28 ENCOUNTER — Telehealth: Payer: Self-pay | Admitting: Gastroenterology

## 2022-03-28 ENCOUNTER — Encounter (HOSPITAL_COMMUNITY): Payer: Self-pay | Admitting: Gastroenterology

## 2022-03-28 NOTE — Telephone Encounter (Signed)
No call placed from this office. Pt is having a hospital procedure, probably preadmission nurse.

## 2022-03-28 NOTE — Telephone Encounter (Signed)
Inbound call from patient inquiring about speaking with someone about medication? Patient is unsure who gave her a call back. Please advise.  Thank you

## 2022-03-31 ENCOUNTER — Ambulatory Visit (HOSPITAL_COMMUNITY)
Admission: RE | Admit: 2022-03-31 | Discharge: 2022-03-31 | Disposition: A | Discharge status: Home / Self Care | Payer: Medicare HMO | Attending: Gastroenterology | Admitting: Gastroenterology

## 2022-03-31 ENCOUNTER — Encounter (HOSPITAL_COMMUNITY)
Admission: RE | Disposition: A | Discharge status: Home / Self Care | Payer: Self-pay | Source: Home / Self Care | Attending: Gastroenterology

## 2022-03-31 ENCOUNTER — Telehealth: Payer: Self-pay

## 2022-03-31 ENCOUNTER — Encounter (HOSPITAL_COMMUNITY): Payer: Self-pay | Admitting: Gastroenterology

## 2022-03-31 ENCOUNTER — Other Ambulatory Visit: Payer: Self-pay

## 2022-03-31 ENCOUNTER — Ambulatory Visit (HOSPITAL_COMMUNITY): Payer: Medicare HMO | Admitting: Anesthesiology

## 2022-03-31 ENCOUNTER — Ambulatory Visit (HOSPITAL_BASED_OUTPATIENT_CLINIC_OR_DEPARTMENT_OTHER): Payer: Medicare HMO | Admitting: Anesthesiology

## 2022-03-31 DIAGNOSIS — K7689 Other specified diseases of liver: Secondary | ICD-10-CM | POA: Diagnosis not present

## 2022-03-31 DIAGNOSIS — I899 Noninfective disorder of lymphatic vessels and lymph nodes, unspecified: Secondary | ICD-10-CM | POA: Insufficient documentation

## 2022-03-31 DIAGNOSIS — K2970 Gastritis, unspecified, without bleeding: Secondary | ICD-10-CM

## 2022-03-31 DIAGNOSIS — K862 Cyst of pancreas: Secondary | ICD-10-CM | POA: Diagnosis not present

## 2022-03-31 DIAGNOSIS — K219 Gastro-esophageal reflux disease without esophagitis: Secondary | ICD-10-CM | POA: Insufficient documentation

## 2022-03-31 DIAGNOSIS — K869 Disease of pancreas, unspecified: Secondary | ICD-10-CM

## 2022-03-31 DIAGNOSIS — K2950 Unspecified chronic gastritis without bleeding: Secondary | ICD-10-CM | POA: Diagnosis not present

## 2022-03-31 DIAGNOSIS — I10 Essential (primary) hypertension: Secondary | ICD-10-CM | POA: Diagnosis not present

## 2022-03-31 DIAGNOSIS — R8569 Abnormal cytological findings in specimens from other digestive organs and abdominal cavity: Secondary | ICD-10-CM | POA: Diagnosis not present

## 2022-03-31 DIAGNOSIS — R1310 Dysphagia, unspecified: Secondary | ICD-10-CM | POA: Diagnosis not present

## 2022-03-31 DIAGNOSIS — E119 Type 2 diabetes mellitus without complications: Secondary | ICD-10-CM | POA: Diagnosis not present

## 2022-03-31 DIAGNOSIS — Z7984 Long term (current) use of oral hypoglycemic drugs: Secondary | ICD-10-CM | POA: Diagnosis not present

## 2022-03-31 DIAGNOSIS — C259 Malignant neoplasm of pancreas, unspecified: Secondary | ICD-10-CM

## 2022-03-31 DIAGNOSIS — I252 Old myocardial infarction: Secondary | ICD-10-CM | POA: Insufficient documentation

## 2022-03-31 DIAGNOSIS — I2510 Atherosclerotic heart disease of native coronary artery without angina pectoris: Secondary | ICD-10-CM | POA: Diagnosis not present

## 2022-03-31 DIAGNOSIS — Q399 Congenital malformation of esophagus, unspecified: Secondary | ICD-10-CM | POA: Diagnosis not present

## 2022-03-31 DIAGNOSIS — M1990 Unspecified osteoarthritis, unspecified site: Secondary | ICD-10-CM | POA: Diagnosis not present

## 2022-03-31 DIAGNOSIS — K319 Disease of stomach and duodenum, unspecified: Secondary | ICD-10-CM | POA: Diagnosis not present

## 2022-03-31 HISTORY — DX: Acute myocardial infarction, unspecified: I21.9

## 2022-03-31 HISTORY — PX: SAVORY DILATION: SHX5439

## 2022-03-31 HISTORY — PX: EUS: SHX5427

## 2022-03-31 HISTORY — PX: BIOPSY: SHX5522

## 2022-03-31 HISTORY — PX: ESOPHAGOGASTRODUODENOSCOPY (EGD) WITH PROPOFOL: SHX5813

## 2022-03-31 HISTORY — PX: FINE NEEDLE ASPIRATION: SHX5430

## 2022-03-31 LAB — GLUCOSE, CAPILLARY: Glucose-Capillary: 148 mg/dL — ABNORMAL HIGH (ref 70–99)

## 2022-03-31 SURGERY — ESOPHAGOGASTRODUODENOSCOPY (EGD) WITH PROPOFOL
Anesthesia: Monitor Anesthesia Care

## 2022-03-31 MED ORDER — PROPOFOL 500 MG/50ML IV EMUL
INTRAVENOUS | Status: DC | PRN
  Administered 2022-03-31: 125 ug/kg/min via INTRAVENOUS

## 2022-03-31 MED ORDER — CIPROFLOXACIN IN D5W 400 MG/200ML IV SOLN
INTRAVENOUS | Status: DC | PRN
  Administered 2022-03-31: 400 mg via INTRAVENOUS

## 2022-03-31 MED ORDER — CIPROFLOXACIN HCL 500 MG PO TABS: 500.0000 mg | ORAL_TABLET | Freq: Two times a day (BID) | ORAL | 0 refills | Status: AC

## 2022-03-31 MED ORDER — CIPROFLOXACIN IN D5W 400 MG/200ML IV SOLN
INTRAVENOUS | Status: AC
  Filled 2022-03-31: qty 200

## 2022-03-31 MED ORDER — PHENYLEPHRINE 80 MCG/ML (10ML) SYRINGE FOR IV PUSH (FOR BLOOD PRESSURE SUPPORT)
PREFILLED_SYRINGE | INTRAVENOUS | Status: DC | PRN
  Administered 2022-03-31: 160 ug via INTRAVENOUS

## 2022-03-31 MED ORDER — PROPOFOL 1000 MG/100ML IV EMUL
INTRAVENOUS | Status: AC
  Filled 2022-03-31: qty 100

## 2022-03-31 MED ORDER — SODIUM CHLORIDE 0.9 % IV SOLN: INTRAVENOUS | Status: DC

## 2022-03-31 MED ORDER — LACTATED RINGERS IV SOLN: INTRAVENOUS | Status: DC | PRN

## 2022-03-31 MED ORDER — PROPOFOL 10 MG/ML IV BOLUS
INTRAVENOUS | Status: DC | PRN
  Administered 2022-03-31 (×5): 20 mg via INTRAVENOUS

## 2022-03-31 SURGICAL SUPPLY — 15 items

## 2022-03-31 NOTE — Discharge Instructions (Signed)
YOU HAD AN ENDOSCOPIC PROCEDURE TODAY: Refer to the procedure report and other information in the discharge instructions given to you for any specific questions about what was found during the examination. If this information does not answer your questions, please call [place] office at [phone] to clarify.  ° °YOU SHOULD EXPECT: Some feelings of bloating in the abdomen. Passage of more gas than usual. Walking can help get rid of the air that was put into your GI tract during the procedure and reduce the bloating. If you had a lower endoscopy (such as a colonoscopy or flexible sigmoidoscopy) you may notice spotting of blood in your stool or on the toilet paper. Some abdominal soreness may be present for a day or two, also. ° °DIET: Your first meal following the procedure should be a light meal and then it is ok to progress to your normal diet. A half-sandwich or bowl of soup is an example of a good first meal. Heavy or fried foods are harder to digest and may make you feel nauseous or bloated. Drink plenty of fluids but you should avoid alcoholic beverages for 24 hours. If you had a esophageal dilation, please see attached instructions for diet.   ° °ACTIVITY: Your care partner should take you home directly after the procedure. You should plan to take it easy, moving slowly for the rest of the day. You can resume normal activity the day after the procedure however YOU SHOULD NOT DRIVE, use power tools, machinery or perform tasks that involve climbing or major physical exertion for 24 hours (because of the sedation medicines used during the test).  ° °SYMPTOMS TO REPORT IMMEDIATELY: °A gastroenterologist can be reached at any hour. Please call [phone]  for any of the following symptoms:  °Following lower endoscopy (colonoscopy, flexible sigmoidoscopy) °Excessive amounts of blood in the stool  °Significant tenderness, worsening of abdominal pains  °Swelling of the abdomen that is new, acute  °Fever of 100° or  higher  °Following upper endoscopy (EGD, EUS, ERCP, esophageal dilation) °Vomiting of blood or coffee ground material  °New, significant abdominal pain  °New, significant chest pain or pain under the shoulder blades  °Painful or persistently difficult swallowing  °New shortness of breath  °Black, tarry-looking or red, bloody stools ° °FOLLOW UP:  °If any biopsies were taken you will be contacted by phone or by letter within the next 1-3 weeks. Call [phone]  if you have not heard about the biopsies in 3 weeks.  °Please also call with any specific questions about appointments or follow up tests. ° °

## 2022-03-31 NOTE — H&P (Signed)
GASTROENTEROLOGY PROCEDURE H&P NOTE   Primary Care Physician: Glenda Chroman, MD  HPI: [name] is a 87 y.o. female who presents for EGD/EUS to evaluate issues of dysphagia and pancreatic cyst for likely aspiration.  Past Medical History:  Diagnosis Date   CAD (coronary artery disease)    a. s/p NSTEMI in 07/2021 with cath showing minimal CAD and no culprit lesion identified   Cramps, extremity    legs and hands   Diabetes ([place])    type II   Enterococcus UTI 02/03/2016   H/O cardiovascular stress test 03/05/2009   normal, no ischemia   H/O Doppler ultrasound    lower ext arterial doppler 04/13/09-no evidence of arterial insufficiency, normal values; distal aorta 2.4x2.7   HTN (hypertension)    Murmur, cardiac    echo 02/25/13- EF 60-65%, impaired relaxation-grade 1 diastolic dysfunction, mild concentric lvh, mild to moderate aortic regurgitation   Myocardial infarction [facility])    january   Pneumonia    May 2017   Postoperative anemia due to acute blood loss 02/02/2016   Primary localized osteoarthritis of right knee    TIA (transient ischemic attack)    carotid doppler 05/08/11-normal patency   Past Surgical History:  Procedure Laterality Date   ABDOMINAL HYSTERECTOMY     BUNIONECTOMY Left    COLONOSCOPY     TOTAL KNEE ARTHROPLASTY Right 02/01/2016   Procedure: TOTAL KNEE ARTHROPLASTY;  Surgeon: Elsie Saas, MD;  Location: [facility];  Service: Orthopedics;  Laterality: Right;   Current Facility-Administered Medications  Medication Dose Route Frequency Provider Last Rate Last Admin   0.9 %  sodium chloride infusion   Intravenous Continuous Mansouraty, Telford Nab., MD       Current Outpatient Medications  Medication Sig Dispense Refill   acetaminophen (TYLENOL) 650 MG CR tablet Take 1,300 mg by mouth in the morning and at bedtime.     aspirin EC 81 MG tablet Take 81 mg by mouth daily. Swallow whole.     atorvastatin (LIPITOR) 80 MG tablet Take 80 mg by mouth  daily at 6 PM.     ciclopirox (PENLAC) 8 % solution Apply 1 Application topically at bedtime. Apply over nail and surrounding skin. Apply daily over previous coat. After seven (7) days, may remove with alcohol and continue cycle.     ciprofloxacin (CIPRO) 500 MG tablet Take 1 tablet (500 mg total) by mouth 2 (two) times daily for 3 days. 6 tablet 0   diclofenac Sodium (VOLTAREN) 1 % GEL Apply 1 Application topically 4 (four) times daily as needed for pain.     docusate sodium (COLACE) 50 MG capsule Take 50 mg by mouth daily as needed for mild constipation.     famotidine (PEPCID) 20 MG tablet One after supper (Patient taking differently: Take 20 mg by mouth daily after supper.) 30 tablet 11   furosemide (LASIX) 20 MG tablet Take 1 tablet (20 mg total) by mouth daily. 90 tablet 3   glipiZIDE (GLUCOTROL XL) 5 MG 24 hr tablet Take 5 mg by mouth 2 (two) times daily.     latanoprost (XALATAN) 0.005 % ophthalmic solution Place 1 drop into both eyes at bedtime.     loratadine (CLARITIN) 10 MG tablet Take 10 mg by mouth daily.     Menthol-Methyl Salicylate (SALONPAS PAIN RELIEF PATCH EX) Apply 1 patch topically daily as needed (pain).     metoprolol succinate (TOPROL-XL) 25 MG 24 hr tablet Take 25 mg by mouth daily.  montelukast (SINGULAIR) 5 MG chewable tablet Chew 5 mg by mouth daily.     nitroGLYCERIN (NITROSTAT) 0.4 MG SL tablet Place 1 tablet (0.4 mg total) under the tongue every 5 (five) minutes as needed. 25 tablet 3   pantoprazole (PROTONIX) 40 MG tablet Take 1 tablet (40 mg total) by mouth daily. Take 30-60 min before first meal of the day 30 tablet 2   valsartan (DIOVAN) 160 MG tablet Take 1 tablet (160 mg total) by mouth daily. 90 tablet 3    Current Facility-Administered Medications:    0.9 %  sodium chloride infusion, , Intravenous, Continuous, Mansouraty, Telford Nab., MD  Current Outpatient Medications:    acetaminophen (TYLENOL) 650 MG CR tablet, Take 1,300 mg by mouth in the morning  and at bedtime., Disp: , Rfl:    aspirin EC 81 MG tablet, Take 81 mg by mouth daily. Swallow whole., Disp: , Rfl:    atorvastatin (LIPITOR) 80 MG tablet, Take 80 mg by mouth daily at 6 PM., Disp: , Rfl:    ciclopirox (PENLAC) 8 % solution, Apply 1 Application topically at bedtime. Apply over nail and surrounding skin. Apply daily over previous coat. After seven (7) days, may remove with alcohol and continue cycle., Disp: , Rfl:    ciprofloxacin (CIPRO) 500 MG tablet, Take 1 tablet (500 mg total) by mouth 2 (two) times daily for 3 days., Disp: 6 tablet, Rfl: 0   diclofenac Sodium (VOLTAREN) 1 % GEL, Apply 1 Application topically 4 (four) times daily as needed for pain., Disp: , Rfl:    docusate sodium (COLACE) 50 MG capsule, Take 50 mg by mouth daily as needed for mild constipation., Disp: , Rfl:    famotidine (PEPCID) 20 MG tablet, One after supper (Patient taking differently: Take 20 mg by mouth daily after supper.), Disp: 30 tablet, Rfl: 11   furosemide (LASIX) 20 MG tablet, Take 1 tablet (20 mg total) by mouth daily., Disp: 90 tablet, Rfl: 3   glipiZIDE (GLUCOTROL XL) 5 MG 24 hr tablet, Take 5 mg by mouth 2 (two) times daily., Disp: , Rfl:    latanoprost (XALATAN) 0.005 % ophthalmic solution, Place 1 drop into both eyes at bedtime., Disp: , Rfl:    loratadine (CLARITIN) 10 MG tablet, Take 10 mg by mouth daily., Disp: , Rfl:    Menthol-Methyl Salicylate (SALONPAS PAIN RELIEF PATCH EX), Apply 1 patch topically daily as needed (pain)., Disp: , Rfl:    metoprolol succinate (TOPROL-XL) 25 MG 24 hr tablet, Take 25 mg by mouth daily., Disp: , Rfl:    montelukast (SINGULAIR) 5 MG chewable tablet, Chew 5 mg by mouth daily., Disp: , Rfl:    nitroGLYCERIN (NITROSTAT) 0.4 MG SL tablet, Place 1 tablet (0.4 mg total) under the tongue every 5 (five) minutes as needed., Disp: 25 tablet, Rfl: 3   pantoprazole (PROTONIX) 40 MG tablet, Take 1 tablet (40 mg total) by mouth daily. Take 30-60 min before first meal of  the day, Disp: 30 tablet, Rfl: 2   valsartan (DIOVAN) 160 MG tablet, Take 1 tablet (160 mg total) by mouth daily., Disp: 90 tablet, Rfl: 3 Allergies  Allergen Reactions   Other Itching    Chest monitor adhesives    Codeine Nausea Only   Milk-Related Compounds Diarrhea   Family History  Problem Relation Age of Onset   Stroke Mother    Cancer Father    Lung cancer Father    Cancer Sister    Diabetes Brother    Diabetes  Brother    Diabetes Brother    Pancreatic cancer Neg Hx    Social History   Socioeconomic History   Marital status: Widowed    Spouse name: Not on file   Number of children: 2   Years of education: Not on file   Highest education level: Not on file  Occupational History   Not on file  Tobacco Use   Smoking status: Never   Smokeless tobacco: Never  Vaping Use   Vaping Use: Never used  Substance and Sexual Activity   Alcohol use: No    Alcohol/week: 0.0 standard drinks of alcohol   Drug use: No   Sexual activity: Not on file  Other Topics Concern   Not on file  Social History Narrative   Step son lives with her.  She is a widow.   Social Determinants of Health   Financial Resource Strain: Not on file  Food Insecurity: Not on file  Transportation Needs: Not on file  Physical Activity: Not on file  Stress: Not on file  Social Connections: Not on file  Intimate Partner Violence: Not on file    Physical Exam: Today's Vitals   03/31/22 0952 03/31/22 1000 03/31/22 1010 03/31/22 1020  BP: (!) 100/59 124/70 139/69 (!) 155/72  Pulse: (!) 57 70 60 63  Resp: (!) '21 10 17 16  '$ Temp:      TempSrc:      SpO2: 100% 98% 98% 97%  Weight:      Height:      PainSc:  0-No pain  0-No pain   Body mass index is 24.33 kg/m. GEN: NAD EYE: Sclerae anicteric ENT: MMM CV: Non-tachycardic GI: Soft, NT/ND NEURO:  Alert & Oriented x 3  Lab Results: No results for input(s): "WBC", "HGB", "HCT", "PLT" in the last 72 hours. BMET No results for input(s): "NA",  "K", "CL", "CO2", "GLUCOSE", "BUN", "CREATININE", "CALCIUM" in the last 72 hours. LFT No results for input(s): "PROT", "ALBUMIN", "AST", "ALT", "ALKPHOS", "BILITOT", "BILIDIR", "IBILI" in the last 72 hours. PT/INR No results for input(s): "LABPROT", "INR" in the last 72 hours.   Impression / Plan: This is a 87 y.o.female who presents for EGD/EUS to evaluate issues of dysphagia and pancreatic cyst for likely aspiration.  The risks of an EUS including intestinal perforation, bleeding, infection, aspiration, and medication effects were discussed as was the possibility it may not give a definitive diagnosis if a biopsy is performed.  When a biopsy of the pancreas is done as part of the EUS, there is an additional risk of pancreatitis at the rate of about 1-2%.  It was explained that procedure related pancreatitis is typically mild, although it can be severe and even life threatening, which is why we do not perform random pancreatic biopsies and only biopsy a lesion/area we feel is concerning enough to warrant the risk.   The risks and benefits of endoscopic evaluation/treatment were discussed with the patient and/or family; these include but are not limited to the risk of perforation, infection, bleeding, missed lesions, lack of diagnosis, severe illness requiring hospitalization, as well as anesthesia and sedation related illnesses.  The patient's history has been reviewed, patient examined, no change in status, and deemed stable for procedure.  The patient and/or family is agreeable to proceed.    Justice Britain, MD [facility] Office # [phone]

## 2022-03-31 NOTE — Transfer of Care (Signed)
Immediate Anesthesia Transfer of Care Note  Patient: [name]  Procedure(s) Performed: ESOPHAGOGASTRODUODENOSCOPY (EGD) WITH PROPOFOL UPPER ENDOSCOPIC ULTRASOUND (EUS) LINEAR BIOPSY FINE NEEDLE ASPIRATION (FNA) LINEAR SAVORY DILATION  Patient Location: PACU  Anesthesia Type:MAC  Level of Consciousness: drowsy  Airway & Oxygen Therapy: Patient Spontanous Breathing and Patient connected to face mask oxygen  Post-op Assessment: Report given to RN, Post -op Vital signs reviewed and stable and Patient moving all extremities X 4  Post vital signs: Reviewed and stable  Last Vitals:  Vitals Value Taken Time  BP 93/59   Temp    Pulse 59 03/31/22 0947  Resp 19 03/31/22 0947  SpO2 100 % 03/31/22 0947  Vitals shown include unvalidated device data.  Last Pain:  Vitals:   03/31/22 0735  TempSrc: Temporal  PainSc: 0-No pain         Complications: No notable events documented.

## 2022-03-31 NOTE — Anesthesia Preprocedure Evaluation (Addendum)
Anesthesia Evaluation  Patient identified by MRN, date of birth, ID band Patient awake    Reviewed: Allergy & Precautions, NPO status , Patient's Chart, lab work & pertinent test results  Airway Mallampati: III  TM Distance: >3 FB Neck ROM: Full    Dental no notable dental hx. (+) Teeth Intact, Dental Advisory Given   Pulmonary neg pulmonary ROS,    Pulmonary exam normal breath sounds clear to auscultation       Cardiovascular hypertension (164/85 preop, normally 130-140 SBP per pt), Pt. on medications + CAD (minimal CAD on cath 07/2021) and + Past MI (NSTEMI 07/2021)  Normal cardiovascular exam+ Valvular Problems/Murmurs (mild AI) AI  Rhythm:Regular Rate:Normal  Echo 2019: normal LVEF, mild AI   Neuro/Psych TIAnegative psych ROS   GI/Hepatic Neg liver ROS, GERD  Medicated and Controlled,  Endo/Other  diabetes, Well Controlled, Type 2, Oral Hypoglycemic Agents  Renal/GU negative Renal ROS  negative genitourinary   Musculoskeletal  (+) Arthritis , Osteoarthritis,    Abdominal   Peds  Hematology negative hematology ROS (+)   Anesthesia Other Findings   Reproductive/Obstetrics negative OB ROS                            Anesthesia Physical Anesthesia Plan  ASA: 3  Anesthesia Plan: MAC   Post-op Pain Management:    Induction:   PONV Risk Score and Plan: 2 and Propofol infusion and TIVA  Airway Management Planned: Natural Airway and Simple Face Mask  Additional Equipment: None  Intra-op Plan:   Post-operative Plan:   Informed Consent: I have reviewed the patients History and Physical, chart, labs and discussed the procedure including the risks, benefits and alternatives for the proposed anesthesia with the patient or authorized representative who has indicated his/her understanding and acceptance.       Plan Discussed with: CRNA  Anesthesia Plan Comments:          Anesthesia Quick Evaluation

## 2022-03-31 NOTE — Anesthesia Postprocedure Evaluation (Signed)
Anesthesia Post Note  Patient: [name]  Procedure(s) Performed: ESOPHAGOGASTRODUODENOSCOPY (EGD) WITH PROPOFOL UPPER ENDOSCOPIC ULTRASOUND (EUS) LINEAR BIOPSY FINE NEEDLE ASPIRATION (FNA) LINEAR SAVORY DILATION     Patient location during evaluation: PACU Anesthesia Type: MAC Level of consciousness: awake and alert Pain management: pain level controlled Vital Signs Assessment: post-procedure vital signs reviewed and stable Respiratory status: spontaneous breathing, nonlabored ventilation and respiratory function stable Cardiovascular status: blood pressure returned to baseline and stable Postop Assessment: no apparent nausea or vomiting Anesthetic complications: no   No notable events documented.  Last Vitals:  Vitals:   03/31/22 1010 03/31/22 1020  BP: 139/69 (!) 155/72  Pulse: 60 63  Resp: 17 16  Temp:    SpO2: 98% 97%    Last Pain:  Vitals:   03/31/22 1020  TempSrc:   PainSc: 0-No pain                 Pervis Hocking

## 2022-03-31 NOTE — Anesthesia Procedure Notes (Signed)
Procedure Name: MAC Date/Time: 03/31/2022 8:39 AM  Performed by: Niel Hummer, CRNAPre-anesthesia Checklist: Patient identified, Emergency Drugs available, Patient being monitored and Suction available Oxygen Delivery Method: Simple face mask

## 2022-03-31 NOTE — Telephone Encounter (Signed)
-----   Message from Irving Copas., MD sent at 03/31/2022 12:13 PM EDT ----- Regarding: EUS report needs to be mailed Cover RN for [name], This patient had her EUS performed today.  Unfortunately there were issues with the completion of the note.  It is now complete.  Can you please print it out and mail her a report. Thanks. GM

## 2022-03-31 NOTE — Telephone Encounter (Signed)
Report mailed.  

## 2022-03-31 NOTE — Op Note (Signed)
[facility] Patient Name: [name] Procedure Date: 03/31/2022 MRN: [id number] Attending MD: Justice Britain , MD Date of Birth: 1934/09/16 CSN: [id number] Age: 87 Admit Type: Outpatient Procedure:                Upper EUS Indications:              Pancreatic cyst on MRCP, Dysphagia Providers:                Justice Britain, MD, Doristine Johns, RN, Cletis Athens, Technician Referring MD:             Elon Alas. Abbey Chatters, DO Medicines:                Monitored Anesthesia Care, Cipro 263 mg IV Complications:            No immediate complications. Estimated Blood Loss:     Estimated blood loss was minimal. Procedure:                Pre-Anesthesia Assessment:                           - Prior to the procedure, a History and Physical                            was performed, and patient medications and                            allergies were reviewed. The patient's tolerance of                            previous anesthesia was also reviewed. The risks                            and benefits of the procedure and the sedation                            options and risks were discussed with the patient.                            All questions were answered, and informed consent                            was obtained. Prior Anticoagulants: The patient has                            taken no previous anticoagulant or antiplatelet                            agents except for aspirin. ASA Grade Assessment:                            III - A patient with severe systemic disease. After  reviewing the risks and benefits, the patient was                            deemed in satisfactory condition to undergo the                            procedure.                           After obtaining informed consent, the endoscope was                            passed under direct vision. Throughout the                             procedure, the patient's blood pressure, pulse, and                            oxygen saturations were monitored continuously. The                            [id number]-H190 ([id number]) Olympus endoscope was introduced                            through the mouth, and advanced to the second part                            of duodenum. The [id number]-Q190V ([id number]) Olympus                            duodenoscope was introduced through the mouth, and                            advanced to the area of papilla. After obtaining                            informed consent, the endoscope was passed under                            direct vision. Throughout the procedure, the                            patient's blood pressure, pulse, and oxygen                            saturations were monitored continuously.The upper                            EUS was accomplished without difficulty. The                            patient tolerated the procedure. The GF-[id number]                            (  [id number]) Olympus linear ultrasound scope was                            introduced through the mouth, and advanced to the                            duodenum for ultrasound examination from the                            stomach and duodenum. Scope In: Scope Out: Findings:      ENDOSCOPIC FINDING: :      No gross mucosal lesions were noted in the entire esophagus. Biopsies       were taken with a cold forceps for histology to rule out EOE/LUE. After       the rest of the EGD and EUS was complete a guidewire was placed and the       scope was withdrawn. Dilation was performed with a Savary dilator with       no resistance at 16 mm and mild resistance at 18 mm. The dilation site       was examined following endoscope reinsertion and showed no change.      The mid esophagus and distal esophagus were significantly tortuous.      The Z-line was regular and was found 35 cm from the incisors.      Patchy mild inflammation  characterized by erosions and erythema was       found in the entire examined stomach. Biopsies were taken with a cold       forceps for histology and Helicobacter pylori testing.      No gross lesions were noted in the duodenal bulb, in the first portion       of the duodenum and in the second portion of the duodenum.      The major papilla was normal. No evidence of a fishmouth deformity.      ENDOSONOGRAPHIC FINDING: :      An anechoic lesion suggestive of a cyst was identified in the pancreatic       body. It is not in obvious communication with the pancreatic duct and       look to be potentially exophytic from the pancreas itself. The lesion       measured 34 mm by 24 mm in maximal cross-sectional diameter. There were       2 compartments thinly septated (bilobed). The outer wall of the lesion       was thin. There was no internal debris within the fluid-filled cavity.       Diagnostic needle aspiration for fluid was performed. Color Doppler       imaging was utilized prior to needle puncture to confirm a lack of       significant vascular structures within the needle path. It required Two       passes with the expect 22 gauge needle using a transgastric approach to       achieve complete fluid removal/decompression. A stylet was used. The       amount of fluid collected was 17 mL. The fluid was clear and serous.       Sample(s) were sent for amylase concentration, cytology and CEA and       glucose and further analysis pending CEA level.  Pancreatic parenchyma was normal in appearance in the pancreatic head       (PD?"2.1 mm), genu of the pancreas (PD?"1.0 mm), pancreatic body (PD?"0.7       mm) and pancreatic tail (PD?"0.6 mm).      There was no sign of significant endosonographic abnormality in the       common bile duct (4.1 mm). Ducts of normal caliber were identified.      A cyst was found in the visualized portion of the liver and measured 26       mm by 23 mm in  maximal cross-sectional diameter. The cyst was       heterogenous. It was without septae. The outer wall of the lesion was       thin. On previous MRI/MRCP imaging this was felt to be a proteinaceous       cyst.      No malignant-appearing lymph nodes were visualized in the celiac region       (level 20), perigastric region and peripancreatic region.      The region of the celiac was visualized. Impression:               EGD impression:                           - No gross mucosal lesions in esophagus. Biopsied.                            Dilated.                           - Tortuous esophagus.                           - Z-line regular, 35 cm from the incisors.                           - Gastritis. Biopsied.                           - No gross lesions in the duodenal bulb, in the                            first portion of the duodenum and in the second                            portion of the duodenum.                           - Normal major papilla.                           EUS impression:                           - A cystic lesion was seen in the pancreatic body                            region (more exophytic). Fine needle aspiration for  fluid performed and sent for                            CEA/amylase/glucose/KRAS & GNAS (pending CEA                            greater than 192).                           - Pancreatic parenchymal abnormalities were noted                            in the pancreatic head, genu of the pancreas,                            pancreatic body and pancreatic tail.                           - There was no sign of significant pathology in the                            common bile duct.                           - A cyst was found in the visualized portion of the                            liver and measured 26 mm by 23 mm. This has been                            noted previously as a proteinaceous cyst. It was                             not aspirated.                           - No malignant-appearing lymph nodes were                            visualized in the celiac region (level 20),                            perigastric region and peripancreatic region. Moderate Sedation:      Not Applicable - Patient had care per Anesthesia. Recommendation:           - The patient will be observed post-procedure,                            until all discharge criteria are met.                           - Discharge patient to home.                           - Patient has a contact number available  for                            emergencies. The signs and symptoms of potential                            delayed complications were discussed with the                            patient. Return to normal activities tomorrow.                            Written discharge instructions were provided to the                            patient.                           - Low fat diet for 1 week.                           - Observe patient's clinical course.                           - Cipro (ciprofloxacin) 500 mg PO BID for 3 days.                           - Follow-up pathology to rule out H. pylori.                           - Follow-up cytology and fluid analysis.                           - Based on findings of fluid analysis will                            determine what follow-up will be, likely MRI/MRCP                            within the next 6 to 12 months unless high risk                            features are noted on fluid analysis.                           - If her dysphagia symptoms do not improve,                            consider esophageal manometry with [facility]                            gastroenterology team.                           - The findings and recommendations were discussed  with the patient.                           - The findings and recommendations were discussed                             with the designated responsible adult. Procedure Code(s):        --- Professional ---                           [phone], Esophagogastroduodenoscopy, flexible,                            transoral; with transendoscopic ultrasound-guided                            intramural or transmural fine needle                            aspiration/biopsy(s), (includes endoscopic                            ultrasound examination limited to the esophagus,                            stomach or duodenum, and adjacent structures)                           43248, Esophagogastroduodenoscopy, flexible,                            transoral; with insertion of guide wire followed by                            passage of dilator(s) through esophagus over guide                            wire Diagnosis Code(s):        --- Professional ---                           Q39.9, Congenital malformation of esophagus,                            unspecified                           K29.70, Gastritis, unspecified, without bleeding                           K86.2, Cyst of pancreas                           K86.9, Disease of pancreas, unspecified                           K76.89, Other specified diseases of liver  I89.9, Noninfective disorder of lymphatic vessels                            and lymph nodes, unspecified                           R13.10, Dysphagia, unspecified CPT copyright 2019 American Medical Association. All rights reserved. The codes documented in this report are preliminary and upon coder review may  be revised to meet current compliance requirements. Justice Britain, MD 03/31/2022 12:13:17 PM Number of Addenda: 0

## 2022-04-01 LAB — SURGICAL PATHOLOGY

## 2022-04-01 LAB — CYTOLOGY - NON PAP

## 2022-04-03 ENCOUNTER — Encounter (HOSPITAL_COMMUNITY): Payer: Self-pay | Admitting: Gastroenterology

## 2022-04-03 ENCOUNTER — Encounter: Payer: Self-pay | Admitting: Gastroenterology

## 2022-04-06 ENCOUNTER — Other Ambulatory Visit: Payer: Self-pay

## 2022-04-06 DIAGNOSIS — K862 Cyst of pancreas: Secondary | ICD-10-CM

## 2022-04-08 DIAGNOSIS — I10 Essential (primary) hypertension: Secondary | ICD-10-CM | POA: Diagnosis not present

## 2022-04-08 DIAGNOSIS — E1165 Type 2 diabetes mellitus with hyperglycemia: Secondary | ICD-10-CM | POA: Diagnosis not present

## 2022-04-12 ENCOUNTER — Encounter: Payer: Self-pay | Admitting: Gastroenterology

## 2022-04-12 NOTE — Progress Notes (Signed)
[facility]  Pancreatic cyst fluid analysis CEA 43,889 ng/mL Amylase less than 3 units/L Glucose less than 4.3 mg/dL  Cytopathology report Atypical cells noted Mild epithelial atypia Cellularity is mild Cell composition columnar cells and atypia Mucin absent Proteinaceous debris present Inflammation absent  KRAS/GNAS PancraGEN molecular testing is pending at this time   Overall, these findings are very suspicious for a mucinous cystic neoplasm.  Pending the final molecular testing we will discuss with the patient our plan of action of follow-up imaging and any further consideration of referrals.  Thankfully no evidence of any cancer cells have been noted up to this point in time.   Justice Britain, MD [facility] Office # [phone]

## 2022-04-12 NOTE — Progress Notes (Signed)
The pt has been advised that no cancer seen at this time however we will contact her as soon as final analysis is back.

## 2022-04-15 DIAGNOSIS — K862 Cyst of pancreas: Secondary | ICD-10-CM | POA: Diagnosis not present

## 2022-04-20 NOTE — Progress Notes (Addendum)
Final [facility] fluid analysis studies  Cytology = hypocellular DNA quantity/quality = Low quantity/poor quality-degraded Oncogene point mutations = GNAS no mutation detected; KRAS high clonality G12V Tumor suppressor genes = no loss of heterozygosity  Statistically higher risk cyst is noted based on this final testing.   Overall, the findings place this patient at a higher risk and will need close monitoring.  Certainly because of her underlying age the likelihood of a surgical resection for a premalignant cyst is unlikely.  I called and spoke with the patient about these results.  The plan of action is for [place] to discuss her case at [facility] in the coming weeks.  I will tentatively plan a 56-monthfollow-up MRI/MRCP to see what the pancreas looks like after our cyst aspiration.  This will then help [place] what follow-up needs to be done from there.  We will make a copy of these records and have them scanned in the chart and also sent to the patient for her own records (via mail).  I will update the patient's primary gastroenterology team as well.  GJustice Britain MD [facility] # [phone]

## 2022-04-22 ENCOUNTER — Other Ambulatory Visit (HOSPITAL_COMMUNITY)
Admission: RE | Admit: 2022-04-22 | Discharge: 2022-04-22 | Disposition: A | Discharge status: Home / Self Care | Payer: Medicare HMO | Source: Ambulatory Visit | Attending: Gastroenterology | Admitting: Gastroenterology

## 2022-04-22 ENCOUNTER — Ambulatory Visit: Payer: Medicare Other | Admitting: Internal Medicine

## 2022-04-22 ENCOUNTER — Encounter: Payer: Self-pay | Admitting: Internal Medicine

## 2022-04-22 DIAGNOSIS — K862 Cyst of pancreas: Secondary | ICD-10-CM | POA: Insufficient documentation

## 2022-04-22 NOTE — Progress Notes (Signed)
11/1 works out Engineer, manufacturing. Thanks. GM

## 2022-04-22 NOTE — Progress Notes (Deleted)
Cardiology Office Note:    Date:  04/22/2022   ID:  TEHYA LEATH, DOB [birth year], MRN [id number]  PCP:  Glenda Chroman, MD   [facility] Providers Cardiologist:  Werner Lean, MD     Referring MD: Glenda Chroman, MD   CC: re-establish after NSTEMI Consulted for the evaluation of CAD at the behest of Glenda Chroman, MD  History of Present Illness:    [name] is a 87 y.o. female with a hx of HTN with DM, mild AI, MR, TR seen 07/27/21 with sudden onset chest heaving.  Peak hs troponin 305.  Found to have non obstructive CAD by R rad Cath.  DS on ASA, lipitor, Toprol XL and HCTZ/Valsatan.  Distantly, patient was seen by this practice.  Patient notes that she is doing fine now.   Notes that her initial symptoms were not CP but were SOB.  Called EMS and and just had emesis.  Her anginal equivalent was chest pressure.  Notes that she does have residual chest pressure. No SOB/DOE and no PND/Orthopnea.  Does note that she can't get a deep breath when she is signing and wonders if this is her heart.  She notes belching and burning CP that feels different that her past angina. No weight gain or leg swelling.  No palpitations or syncope.  Ambulatory blood pressure SBP 130.   Past Medical History:  Diagnosis Date   CAD (coronary artery disease)    a. s/p NSTEMI in 07/2021 with cath showing minimal CAD and no culprit lesion identified   Cramps, extremity    legs and hands   Diabetes ([place])    type II   Enterococcus UTI 02/03/2016   H/O cardiovascular stress test 03/05/2009   normal, no ischemia   H/O Doppler ultrasound    lower ext arterial doppler 04/13/09-no evidence of arterial insufficiency, normal values; distal aorta 2.4x2.7   HTN (hypertension)    Murmur, cardiac    echo 02/25/13- EF 60-65%, impaired relaxation-grade 1 diastolic dysfunction, mild concentric lvh, mild to moderate aortic regurgitation   Myocardial infarction [facility])    january   Pneumonia     May 2017   Postoperative anemia due to acute blood loss 02/02/2016   Primary localized osteoarthritis of right knee    TIA (transient ischemic attack)    carotid doppler 05/08/11-normal patency    Past Surgical History:  Procedure Laterality Date   ABDOMINAL HYSTERECTOMY     BIOPSY  03/31/2022   Procedure: BIOPSY;  Surgeon: Irving Copas., MD;  Location: [facility];  Service: Gastroenterology;;   Lillard Anes Left    COLONOSCOPY     ESOPHAGOGASTRODUODENOSCOPY (EGD) WITH PROPOFOL N/A 03/31/2022   Procedure: ESOPHAGOGASTRODUODENOSCOPY (EGD) WITH PROPOFOL;  Surgeon: Irving Copas., MD;  Location: [facility];  Service: Gastroenterology;  Laterality: N/A;   EUS N/A 03/31/2022   Procedure: UPPER ENDOSCOPIC ULTRASOUND (EUS) LINEAR;  Surgeon: Irving Copas., MD;  Location: [facility];  Service: Gastroenterology;  Laterality: N/A;   FINE NEEDLE ASPIRATION  03/31/2022   Procedure: FINE NEEDLE ASPIRATION (FNA) LINEAR;  Surgeon: Irving Copas., MD;  Location: [facility];  Service: Gastroenterology;;   Azzie Almas DILATION N/A 03/31/2022   Procedure: Azzie Almas DILATION;  Surgeon: Irving Copas., MD;  Location: [facility];  Service: Gastroenterology;  Laterality: N/A;   TOTAL KNEE ARTHROPLASTY Right 02/01/2016   Procedure: TOTAL KNEE ARTHROPLASTY;  Surgeon: Elsie Saas, MD;  Location: [place];  Service: Orthopedics;  Laterality: Right;  Current Medications: No outpatient medications have been marked as taking for the 04/22/22 encounter (Appointment) with Werner Lean, MD.     Allergies:   Other, Codeine, and Milk-related compounds   Social History   Socioeconomic History   Marital status: Widowed    Spouse name: Not on file   Number of children: 2   Years of education: Not on file   Highest education level: Not on file  Occupational History   Not on file  Tobacco Use   Smoking status: Never   Smokeless tobacco: Never  Vaping Use    Vaping Use: Never used  Substance and Sexual Activity   Alcohol use: No    Alcohol/week: 0.0 standard drinks of alcohol   Drug use: No   Sexual activity: Not on file  Other Topics Concern   Not on file  Social History Narrative   Step son lives with her.  She is a widow.   Social Determinants of Health   Financial Resource Strain: Not on file  Food Insecurity: Not on file  Transportation Needs: Not on file  Physical Activity: Not on file  Stress: Not on file  Social Connections: Not on file     Family History: The patient's family history includes Cancer in her father and sister; Diabetes in her brother, brother, and brother; Lung cancer in her father; Stroke in her mother. There is no history of Pancreatic cancer.  ROS:   Please see the history of present illness.     All other systems reviewed and are negative.  EKGs/Labs/Other Studies Reviewed:    The following studies were reviewed today:  EKG:  EKG is  ordered today.  The ekg ordered today demonstrates  09/08/21: SR 73 Anterior infarct  Recent Labs: 01/13/2022: BNP 92.3; BUN 36; Creatinine, Ser 1.72; Hemoglobin 12.6; Platelets 198; Potassium 4.2; Sodium 142; TSH 0.720  Recent Lipid Panel    Component Value Date/Time   CHOL 165 03/01/2013 1034   TRIG 214 (H) 03/01/2013 1034   HDL 43 03/01/2013 1034   CHOLHDL 3.8 03/01/2013 1034   VLDL 43 (H) 03/01/2013 1034   LDLCALC 79 03/01/2013 1034       Physical Exam:    VS:  There were no vitals taken for this visit.    Wt Readings from Last 3 Encounters:  03/31/22 160 lb (72.6 kg)  02/23/22 163 lb 12.8 oz (74.3 kg)  01/13/22 163 lb (73.9 kg)     Gen: No distress Neck: No JVD Cardiac: No Rubs or Gallops, I/VI murmur, RRR +2 radial pulses Respiratory: Clear to auscultation bilaterally, normal effort, normal  respiratory rate GI: Soft, nontender, non-distended  MS: +1 pitting edema;  moves all extremities Integument: Skin feels warm Neuro:  At time of  evaluation, alert and oriented to person/place/time/situation  Psych: Anxious affect   ASSESSMENT:    No diagnosis found.  PLAN:    Non-obstructive CAD HTN and DM, HLD Mild AI, MR, and TR with plans for 2026 echo unless new sx Coronary Artery Disease; Nonobstructive LE edema - stable angina on therapy - continue ASA 81 mg - continue statin, goal LDL < 55 - Starting PRN nitrates - continue metoprolol 50 mg - continue amb BP monitoring, low threshold to start ARB - discussed cardiac rehab- OK to start - will check BMP and BNP        Medication Adjustments/Labs and Tests Ordered: Current medicines are reviewed at length with the patient today.  Concerns regarding medicines are outlined  above.  No orders of the defined types were placed in this encounter.  No orders of the defined types were placed in this encounter.   There are no Patient Instructions on file for this visit.   Signed, Werner Lean, MD  04/22/2022 1:01 PM    [facility]

## 2022-04-23 LAB — CANCER ANTIGEN 19-9: CA 19-9: <2 U/mL (ref 0–35)

## 2022-04-24 NOTE — Progress Notes (Signed)
Thank you Dr. Rush Landmark for keeping [place] updated.

## 2022-04-26 ENCOUNTER — Encounter: Payer: Self-pay | Admitting: Gastroenterology

## 2022-05-04 ENCOUNTER — Telehealth: Payer: Self-pay | Admitting: *Deleted

## 2022-05-04 ENCOUNTER — Ambulatory Visit: Payer: Medicare Other | Admitting: Internal Medicine

## 2022-05-04 NOTE — Chronic Care Management (AMB) (Signed)
  Care Coordination   Note   05/04/2022 Name: [name] MRN: [id number] DOB: 11-10-1934  [name] is a 87 y.o. year old female who sees Vyas, Costella Hatcher, MD for primary care. I reached out to [name] by phone today to offer care coordination services.  [name] was given information about Care Coordination services today including:   The Care Coordination services include support from the care team which includes your Nurse Coordinator, Clinical Social Worker, or Pharmacist.  The Care Coordination team is here to help remove barriers to the health concerns and goals most important to you. Care Coordination services are voluntary, and the patient may decline or stop services at any time by request to their care team member.   Care Coordination Consent Status: Patient did not agree to participate in care coordination services at this time.    Encounter Outcome:  Pt. Refused  [place]  Direct Dial: [phone]

## 2022-05-05 ENCOUNTER — Ambulatory Visit: Payer: Medicare HMO | Admitting: Internal Medicine

## 2022-05-05 NOTE — Progress Notes (Deleted)
Cardiology Office Note:    Date:  05/05/2022   ID:  [name], DOB 1934-11-27, MRN [id number]  PCP:  Glenda Chroman, MD   [facility] Providers Cardiologist:  Werner Lean, MD     Referring MD: Glenda Chroman, MD   CC: re-establish after NSTEMI Consulted for the evaluation of CAD at the behest of Glenda Chroman, MD  History of Present Illness:    [name] is a 87 y.o. female with a hx of HTN with DM, mild AI, MR, TR seen 07/27/21 with sudden onset chest heaving.  Peak hs troponin 305.  Found to have non obstructive CAD by R rad Cath.  DS on ASA, lipitor, Toprol XL and HCTZ/Valsatan.  Distantly, patient was seen by this practice.  Patient notes that she is doing fine now.   Notes that her initial symptoms were not CP but were SOB.  Called EMS and and just had emesis.  Her anginal equivalent was chest pressure.  Notes that she does have residual chest pressure. No SOB/DOE and no PND/Orthopnea.  Does note that she can't get a deep breath when she is signing and wonders if this is her heart.  She notes belching and burning CP that feels different that her past angina. No weight gain or leg swelling.  No palpitations or syncope.  Ambulatory blood pressure SBP 130.   Past Medical History:  Diagnosis Date   CAD (coronary artery disease)    a. s/p NSTEMI in 07/2021 with cath showing minimal CAD and no culprit lesion identified   Cramps, extremity    legs and hands   Diabetes ([place])    type II   Enterococcus UTI 02/03/2016   H/O cardiovascular stress test 03/05/2009   normal, no ischemia   H/O Doppler ultrasound    lower ext arterial doppler 04/13/09-no evidence of arterial insufficiency, normal values; distal aorta 2.4x2.7   HTN (hypertension)    Murmur, cardiac    echo 02/25/13- EF 60-65%, impaired relaxation-grade 1 diastolic dysfunction, mild concentric lvh, mild to moderate aortic regurgitation   Myocardial infarction [facility]    january   Pneumonia     May 2017   Postoperative anemia due to acute blood loss 02/02/2016   Primary localized osteoarthritis of right knee    TIA (transient ischemic attack)    carotid doppler 05/08/11-normal patency    Past Surgical History:  Procedure Laterality Date   ABDOMINAL HYSTERECTOMY     BIOPSY  03/31/2022   Procedure: BIOPSY;  Surgeon: Irving Copas., MD;  Location: [facility];  Service: Gastroenterology;;   Lillard Anes Left    COLONOSCOPY     ESOPHAGOGASTRODUODENOSCOPY (EGD) WITH PROPOFOL N/A 03/31/2022   Procedure: ESOPHAGOGASTRODUODENOSCOPY (EGD) WITH PROPOFOL;  Surgeon: Irving Copas., MD;  Location: [facility];  Service: Gastroenterology;  Laterality: N/A;   EUS N/A 03/31/2022   Procedure: UPPER ENDOSCOPIC ULTRASOUND (EUS) LINEAR;  Surgeon: Irving Copas., MD;  [facility]: [facility];  Service: Gastroenterology;  Laterality: N/A;   FINE NEEDLE ASPIRATION  03/31/2022   Procedure: FINE NEEDLE ASPIRATION (FNA) LINEAR;  Surgeon: Irving Copas., MD;  Location: [facility];  Service: Gastroenterology;;   Azzie Almas DILATION N/A 03/31/2022   Procedure: Azzie Almas DILATION;  Surgeon: Irving Copas., MD;  Location: [facility];  Service: Gastroenterology;  Laterality: N/A;   TOTAL KNEE ARTHROPLASTY Right 02/01/2016   Procedure: TOTAL KNEE ARTHROPLASTY;  Surgeon: Elsie Saas, MD;  Location: [place];  Service: Orthopedics;  Laterality: Right;  Current Medications: No outpatient medications have been marked as taking for the 05/05/22 encounter (Appointment) with Werner Lean, MD.     Allergies:   Other, Codeine, and Milk-related compounds   Social History   Socioeconomic History   Marital status: Widowed    Spouse name: Not on file   Number of children: 2   Years of education: Not on file   Highest education level: Not on file  Occupational History   Not on file  Tobacco Use   Smoking status: Never   Smokeless tobacco: Never  Vaping Use    Vaping Use: Never used  Substance and Sexual Activity   Alcohol use: No    Alcohol/week: 0.0 standard drinks of alcohol   Drug use: No   Sexual activity: Not on file  Other Topics Concern   Not on file  Social History Narrative   Step son lives with her.  She is a widow.   Social Determinants of Health   Financial Resource Strain: Not on file  Food Insecurity: Not on file  Transportation Needs: Not on file  Physical Activity: Not on file  Stress: Not on file  Social Connections: Not on file     Family History: The patient's family history includes Cancer in her father and sister; Diabetes in her brother, brother, and brother; Lung cancer in her father; Stroke in her mother. There is no history of Pancreatic cancer.  ROS:   Please see the history of present illness.     All other systems reviewed and are negative.  EKGs/Labs/Other Studies Reviewed:    The following studies were reviewed today:  EKG:  EKG is  ordered today.  The ekg ordered today demonstrates  09/08/21: SR 73 Anterior infarct  Recent Labs: 01/13/2022: BNP 92.3; BUN 36; Creatinine, Ser 1.72; Hemoglobin 12.6; Platelets 198; Potassium 4.2; Sodium 142; TSH 0.720  Recent Lipid Panel    Component Value Date/Time   CHOL 165 03/01/2013 1034   TRIG 214 (H) 03/01/2013 1034   HDL 43 03/01/2013 1034   CHOLHDL 3.8 03/01/2013 1034   VLDL 43 (H) 03/01/2013 1034   LDLCALC 79 03/01/2013 1034       Physical Exam:    VS:  There were no vitals taken for this visit.    Wt Readings from Last 3 Encounters:  03/31/22 160 lb (72.6 kg)  02/23/22 163 lb 12.8 oz (74.3 kg)  01/13/22 163 lb (73.9 kg)     Gen: No distress Neck: No JVD Cardiac: No Rubs or Gallops, I/VI murmur, RRR +2 radial pulses Respiratory: Clear to auscultation bilaterally, normal effort, normal  respiratory rate GI: Soft, nontender, non-distended  MS: +1 pitting edema;  moves all extremities Integument: Skin feels warm Neuro:  At time of  evaluation, alert and oriented to person/place/time/situation  Psych: Anxious affect   ASSESSMENT:    No diagnosis found.  PLAN:    Non-obstructive CAD HTN and DM, HLD Mild AI, MR, and TR with plans for 2026 echo unless new sx Coronary Artery Disease; Nonobstructive LE edema - stable angina on therapy - continue ASA 81 mg - continue statin, goal LDL < 55 - Starting PRN nitrates - continue metoprolol 50 mg - continue amb BP monitoring, low threshold to start ARB - discussed cardiac rehab- OK to start - will check BMP and BNP        Medication Adjustments/Labs and Tests Ordered: Current medicines are reviewed at length with the patient today.  Concerns regarding medicines are outlined  above.  No orders of the defined types were placed in this encounter.  No orders of the defined types were placed in this encounter.   There are no Patient Instructions on file for this visit.   Signed, Werner Lean, MD  05/05/2022 1:18 PM    [place]

## 2022-05-09 DIAGNOSIS — E1165 Type 2 diabetes mellitus with hyperglycemia: Secondary | ICD-10-CM | POA: Diagnosis not present

## 2022-05-09 DIAGNOSIS — I10 Essential (primary) hypertension: Secondary | ICD-10-CM | POA: Diagnosis not present

## 2022-05-11 ENCOUNTER — Ambulatory Visit: Payer: Medicare HMO | Attending: Internal Medicine | Admitting: Internal Medicine

## 2022-05-11 ENCOUNTER — Encounter: Payer: Self-pay | Admitting: Internal Medicine

## 2022-05-11 ENCOUNTER — Encounter: Payer: Self-pay | Admitting: Gastroenterology

## 2022-05-11 ENCOUNTER — Other Ambulatory Visit: Payer: Self-pay

## 2022-05-11 VITALS — BP 142/90 | HR 65 | Ht 68.0 in | Wt 162.0 lb

## 2022-05-11 DIAGNOSIS — I351 Nonrheumatic aortic (valve) insufficiency: Secondary | ICD-10-CM

## 2022-05-11 DIAGNOSIS — I10 Essential (primary) hypertension: Secondary | ICD-10-CM | POA: Diagnosis not present

## 2022-05-11 DIAGNOSIS — R0609 Other forms of dyspnea: Secondary | ICD-10-CM

## 2022-05-11 DIAGNOSIS — R053 Chronic cough: Secondary | ICD-10-CM

## 2022-05-11 DIAGNOSIS — I25118 Atherosclerotic heart disease of native coronary artery with other forms of angina pectoris: Secondary | ICD-10-CM

## 2022-05-11 MED ORDER — FUROSEMIDE 40 MG PO TABS: 40.0000 mg | ORAL_TABLET | Freq: Every day | ORAL | 3 refills | Status: DC

## 2022-05-11 NOTE — Progress Notes (Signed)
The proposed treatment discussed in conference is for discussion purpose only and is not a binding recommendation.  The patients have not been physically examined, or presented with their treatment options.  Therefore, final treatment plans cannot be decided.  

## 2022-05-11 NOTE — Progress Notes (Signed)
Cardiology Office Note:    Date:  05/11/2022   ID:  [name], DOB 03-09-1935, MRN [id number]  PCP:  Glenda Chroman, MD   [facility] Providers Cardiologist:  Werner Lean, MD     Referring MD: Glenda Chroman, MD   CC: follow up SOB  History of Present Illness:    [name] is a 87 y.o. female with a hx of HTN with DM, mild AI, MR, TR seen 07/27/21 with sudden onset chest heaving.  Peak hs troponin 305.  Found to have non obstructive CAD by R rad Cath.  DS on ASA, lipitor, Toprol XL and HCTZ/Valsatan. 2023: Saw Pulm- still having SOB, had pancreatic cyst eval and pre-op for EGD.  No cardiac issues with EGD.  Patient notes that she is doing [place].   Since last visit notes that she is still active at church  her friend is with her today and she notes that she has issues getting her breath.  Still have cough.  Can't take a deep breath because of cough. Has to lean on the buggy at the grocery store for [place].  No CP, no palpitations.  No syncope.   Past Medical History:  Diagnosis Date   CAD (coronary artery disease)    a. s/p NSTEMI in 07/2021 with cath showing minimal CAD and no culprit lesion identified   Cramps, extremity    legs and hands   Diabetes ([place])    type II   Enterococcus UTI 02/03/2016   H/O cardiovascular stress test 03/05/2009   normal, no ischemia   H/O Doppler ultrasound    lower ext arterial doppler 04/13/09-no evidence of arterial insufficiency, normal values; distal aorta 2.4x2.7   HTN (hypertension)    Murmur, cardiac    echo 02/25/13- EF 60-65%, impaired relaxation-grade 1 diastolic dysfunction, mild concentric lvh, mild to moderate aortic regurgitation   Myocardial infarction [facility])    january   Pneumonia    May 2017   Postoperative anemia due to acute blood loss 02/02/2016   Primary localized osteoarthritis of right knee    TIA (transient ischemic attack)    carotid doppler 05/08/11-normal patency    Past Surgical History:   Procedure Laterality Date   ABDOMINAL HYSTERECTOMY     BIOPSY  03/31/2022   Procedure: BIOPSY;  Surgeon: Irving Copas., MD;  [facility];  Service: Gastroenterology;;   Lillard Anes Left    COLONOSCOPY     ESOPHAGOGASTRODUODENOSCOPY (EGD) WITH PROPOFOL N/A 03/31/2022   Procedure: ESOPHAGOGASTRODUODENOSCOPY (EGD) WITH PROPOFOL;  Surgeon: Irving Copas., MD;  Location: [facility];  Service: Gastroenterology;  Laterality: N/A;   EUS N/A 03/31/2022   Procedure: UPPER ENDOSCOPIC ULTRASOUND (EUS) LINEAR;  Surgeon: Irving Copas., MD;  [facility];  Service: Gastroenterology;  Laterality: N/A;   FINE NEEDLE ASPIRATION  03/31/2022   Procedure: FINE NEEDLE ASPIRATION (FNA) LINEAR;  Surgeon: Irving Copas., MD;  Location: [facility];  Service: Gastroenterology;;   Azzie Almas DILATION N/A 03/31/2022   Procedure: Azzie Almas DILATION;  Surgeon: Irving Copas., MD;  Location: [facility];  Service: Gastroenterology;  Laterality: N/A;   TOTAL KNEE ARTHROPLASTY Right 02/01/2016   Procedure: TOTAL KNEE ARTHROPLASTY;  Surgeon: Elsie Saas, MD;  Location: [place];  Service: Orthopedics;  Laterality: Right;    Current Medications: Current Meds  Medication Sig   acetaminophen (TYLENOL) 650 MG CR tablet Take 1,300 mg by mouth in the morning and at bedtime.   aspirin EC 81 MG tablet  Take 81 mg by mouth daily. Swallow whole.   atorvastatin (LIPITOR) 80 MG tablet Take 80 mg by mouth daily at 6 PM.   ciclopirox (PENLAC) 8 % solution Apply 1 Application topically at bedtime. Apply over nail and surrounding skin. Apply daily over previous coat. After seven (7) days, may remove with alcohol and continue cycle.   diclofenac Sodium (VOLTAREN) 1 % GEL Apply 1 Application topically 4 (four) times daily as needed for pain.   docusate sodium (COLACE) 50 MG capsule Take 50 mg by mouth daily as needed for mild constipation.   famotidine (PEPCID) 20 MG tablet One  after supper   furosemide (LASIX) 40 MG tablet Take 1 tablet (40 mg total) by mouth daily.   glipiZIDE (GLUCOTROL XL) 5 MG 24 hr tablet Take 5 mg by mouth 2 (two) times daily.   latanoprost (XALATAN) 0.005 % ophthalmic solution Place 1 drop into both eyes at bedtime.   loratadine (CLARITIN) 10 MG tablet Take 10 mg by mouth daily.   Menthol-Methyl Salicylate (SALONPAS PAIN RELIEF PATCH EX) Apply 1 patch topically daily as needed (pain).   metoprolol succinate (TOPROL-XL) 25 MG 24 hr tablet Take 25 mg by mouth daily.   nitroGLYCERIN (NITROSTAT) 0.4 MG SL tablet Place 1 tablet (0.4 mg total) under the tongue every 5 (five) minutes as needed.   pantoprazole (PROTONIX) 40 MG tablet Take 1 tablet (40 mg total) by mouth daily. Take 30-60 min before first meal of the day   valsartan (DIOVAN) 160 MG tablet Take 1 tablet (160 mg total) by mouth daily.   [DISCONTINUED] furosemide (LASIX) 20 MG tablet Take 1 tablet (20 mg total) by mouth daily.   [DISCONTINUED] montelukast (SINGULAIR) 5 MG chewable tablet Chew 5 mg by mouth daily.     Allergies:   Other, Codeine, and Milk-related compounds   Social History   Socioeconomic History   Marital status: Widowed    Spouse name: Not on file   Number of children: 2   Years of education: Not on file   Highest education level: Not on file  Occupational History   Not on file  Tobacco Use   Smoking status: Never   Smokeless tobacco: Never  Vaping Use   Vaping Use: Never used  Substance and Sexual Activity   Alcohol use: No    Alcohol/week: 0.0 standard drinks of alcohol   Drug use: No   Sexual activity: Not on file  Other Topics Concern   Not on file  Social History Narrative   Step son lives with her.  She is a widow.   Social Determinants of Health   Financial Resource Strain: Not on file  Food Insecurity: Not on file  Transportation Needs: Not on file  Physical Activity: Not on file  Stress: Not on file  Social Connections: Not on file      Family History: The patient's family history includes Cancer in her father and sister; Diabetes in her brother, brother, and brother; Lung cancer in her father; Stroke in her mother. There is no history of Pancreatic cancer.  ROS:   Please see the history of present illness.     All other systems reviewed and are negative.  EKGs/Labs/Other Studies Reviewed:    The following studies were reviewed today:  EKG:  EKG is  ordered today.  The ekg ordered today demonstrates  05/11/22: SR with LVH rate 65 09/08/21: SR 73 Anterior infarct  Recent Labs: 01/13/2022: BNP 92.3; BUN 36; Creatinine, Ser 1.72; Hemoglobin  12.6; Platelets 198; Potassium 4.2; Sodium 142; TSH 0.720  Recent Lipid Panel    Component Value Date/Time   CHOL 165 03/01/2013 1034   TRIG 214 (H) 03/01/2013 1034   HDL 43 03/01/2013 1034   CHOLHDL 3.8 03/01/2013 1034   VLDL 43 (H) 03/01/2013 1034   LDLCALC 79 03/01/2013 1034       Cardiac Studies & Procedures     STRESS TESTS  NM MYOCAR MULTI W/SPECT W 11/03/2017  Narrative  There was no ST segment deviation noted during stress.  This is a low risk study.  The left ventricular ejection fraction is normal (55-65%).  Small mild intensity apical defect with mild reversibility and normal wall motion. Suggests differences in apical thinning vs mild apical infarct with mild peri-infarct ischemia. Either finding would support low risk   ECHOCARDIOGRAM  ECHOCARDIOGRAM COMPLETE 11/15/2017              Transthoracic Echocardiogram: Date: Results: Echo 07/27/21   The left ventricular size is normal.  There is normal left ventricular wall thickness.  Left ventricular systolic function is normal.  The right ventricle is normal in size and function.  The left atrial size is normal.  There is mild aortic regurgitation.  There is mild mitral regurgitation.     Physical Exam:    VS:  BP (!) 142/90   Pulse 65   Ht '5\' 8"'$  (1.727 m)   Wt 162 lb (73.5 kg)   SpO2 97%    BMI 24.63 kg/m     Wt Readings from Last 3 Encounters:  05/11/22 162 lb (73.5 kg)  03/31/22 160 lb (72.6 kg)  02/23/22 163 lb 12.8 oz (74.3 kg)    Gen: No distress Neck: No JVD Cardiac: No Rubs or Gallops, I/VI murmur, RRR +2 radial pulses Respiratory: Rhonci cleared by cough, normal effort, normal respiratory rate GI: Soft, nontender, non-distended  MS: non pitting edema;  moves all extremities Integument: Skin feels warm Neuro:  At time of evaluation, alert and oriented to person/place/time/situation  Psych: Anxious affect  ASSESSMENT:    1. DOE (dyspnea on exertion)   2. Nonrheumatic aortic valve insufficiency   3. Primary hypertension   4. Chronic cough   5. Coronary artery disease of native artery of native heart with stable angina pectoris (HCC)     PLAN:     DOE With LVH, AI, TR and MR With CKD stage IIIa HTN - increase to lasix 40 mg PO Daily - bmp and bnp in two weeks at [facility] -repeat echo at [facility] - appreciate Pulm Recs  Non-obstructive CAD HLD Prior TIA - ASA 81 mg daily, Atorvastatin 80 mg daily, Toprol-XL 25 mg daily and Valsartan 160 mg daily. - LDL < 70  Winter-Spring f/u 2024     Medication Adjustments/Labs and Tests Ordered: Current medicines are reviewed at length with the patient today.  Concerns regarding medicines are outlined above.  Orders Placed This Encounter  Procedures   Basic metabolic panel   Pro b natriuretic peptide (BNP)   EKG 12-Lead   ECHOCARDIOGRAM COMPLETE   Meds ordered this encounter  Medications   furosemide (LASIX) 40 MG tablet    Sig: Take 1 tablet (40 mg total) by mouth daily.    Dispense:  90 tablet    Refill:  3    Dose change (increased).    Patient Instructions  Medication Instructions:  Your physician has recommended you make the following change in your medication:   1) INCREASE furosemide (  Lasix) to '40mg'$  daily  *If you need a refill on your cardiac medications before your next appointment, please  call your pharmacy*  Lab Work: In 2 weeks: BNP, BMP (at [place] office) If you have labs (blood work) drawn today and your tests are completely normal, you will receive your results only by: [place] (if you have MyChart) OR A paper copy in the mail If you have any lab test that is abnormal or we need to change your treatment, we will call you to review the results.  Testing/Procedures: Your physician has requested that you have an echocardiogram at [facility] office. Echocardiography is a painless test that uses sound waves to create images of your heart. It provides your doctor with information about the size and shape of your heart and how well your heart's chambers and valves are working. This procedure takes approximately one hour. There are no restrictions for this procedure. Please do NOT wear cologne, perfume, aftershave, or lotions (deodorant is allowed). Please arrive 15 minutes prior to your appointment time.  Follow-Up: At [facility], you and your health needs are our priority.  As part of our continuing mission to provide you with exceptional heart care, we have created designated Provider Care Teams.  These Care Teams include your primary Cardiologist (physician) and Advanced Practice Providers (APPs -  Physician Assistants and Nurse Practitioners) who all work together to provide you with the care you need, when you need it.  Your next appointment:   4 month(s)  The format for your next appointment:   In Person  Provider:   Werner Lean, MD   Important Information About Sugar         Signed, Werner Lean, MD  05/11/2022 5:15 PM    [place]

## 2022-05-11 NOTE — Patient Instructions (Signed)
Medication Instructions:  Your physician has recommended you make the following change in your medication:   1) INCREASE furosemide (Lasix) to '40mg'$  daily  *If you need a refill on your cardiac medications before your next appointment, please call your pharmacy*  Lab Work: In 2 weeks: BNP, BMP (at [place] office) If you have labs (blood work) drawn today and your tests are completely normal, you will receive your results only by: [place] (if you have MyChart) OR A paper copy in the mail If you have any lab test that is abnormal or we need to change your treatment, we will call you to review the results.  Testing/Procedures: Your physician has requested that you have an echocardiogram at [facility] office. Echocardiography is a painless test that uses sound waves to create images of your heart. It provides your doctor with information about the size and shape of your heart and how well your heart's chambers and valves are working. This procedure takes approximately one hour. There are no restrictions for this procedure. Please do NOT wear cologne, perfume, aftershave, or lotions (deodorant is allowed). Please arrive 15 minutes prior to your appointment time.  Follow-Up: At [facility], you and your health needs are our priority.  As part of our continuing mission to provide you with exceptional heart care, we have created designated Provider Care Teams.  These Care Teams include your primary Cardiologist (physician) and Advanced Practice Providers (APPs -  Physician Assistants and Nurse Practitioners) who all work together to provide you with the care you need, when you need it.  Your next appointment:   4 month(s)  The format for your next appointment:   In Person  Provider:   Werner Lean, MD   Important Information About Sugar

## 2022-05-12 NOTE — Progress Notes (Signed)
Copy sent to pts PCP. Reminder in epic for MR/MRCP and CA 19-9 in 4 mths.

## 2022-05-12 NOTE — Progress Notes (Signed)
Patient's case was discussed at [facility] today. Imaging and EUS and pathology was reviewed.  Consensus from the group was that in most instances, this particular cyst with findings on cytology would be recommended for consideration of intervention due to high risk nature and mucinous cystic neoplasm setting.  With this being said, at her current age, her comorbidities, her asymptomatic nature, it would be reasonable to consider surveillance.  Our hepatobiliary surgeons would be happy to meet the patient as well.  I called and spoke with the patient about these results in this discussion.  She is in agreement that surgery at this point is not something she wants due to her asymptomatic nature but she is interested in monitoring in case something else were to develop or change.  She is okay with holding off on hepatobiliary referral at this time.  At this point our plan will be to repeat MRI/MRCP at a 61-monthinterval from her last EUS.  If there is no dramatic change in the cyst itself, then we will plan a 621-monthollow-up and if things are stable at that point we will plan a 1 year surveillance thereafter.  She will obtain a CA 19-9 at the time of her follow-up MRI/MRCP as well and we will make further discussions or decisions from there.  The patient understands that not undergoing surgery could mean that a premalignant cyst, as this seems to be, may have the risk of developing into cancer but due to her asymptomatic nature she is willing to accept this currently.  I will forward this information to the patient's primary gastroenterology team so they are aware.  My office can work on coordinating the follow-up imaging and lab.  GaJustice BritainMD [facility] # [phone]

## 2022-05-12 NOTE — Progress Notes (Signed)
Thanks for the update GM

## 2022-05-16 NOTE — Progress Notes (Deleted)
GI Office Note    Referring Provider: Glenda Chroman, MD Primary Care Physician:  Glenda Chroman, MD  Primary Gastroenterologist: Elon Alas. Abbey Chatters, DO   Chief Complaint   No chief complaint on file.   History of Present Illness   [name] is a 87 y.o. female presenting today for follow up of cystic mass of pancreas. Seen in our office in 01/2022. Also with chronic constipation, dysphagia, liver lesion.  CT chest without contrast 12/10/2021 with enlarging cystic pancreatic lesion extending into the lesser sac and measuring greater than 3 cm, previously measuring 2 cm.  Also with pancreatic atrophy.  Patient reports no history of pancreatitis.   MRI***  Colonoscopy with Dr. Cristina Gong 2011, diverticulosis, surveillance colonoscopy in 5 years due to personal history of colon polyps.      EUS with Dr. Rush Landmark.  Final Interpace Pancreagen fluid analysis studies   Cytology = hypocellular DNA quantity/quality = Low quantity/poor quality-degraded Oncogene point mutations = GNAS no mutation detected; KRAS high clonality G12V Tumor suppressor genes = no loss of heterozygosity   Statistically higher risk cyst is noted based on this final testing.     Overall, the findings place this patient at a higher risk and will need close monitoring.  Certainly because of her underlying age the likelihood of a surgical resection for a premalignant cyst is unlikely.   I called and spoke with the patient about these results.   The plan of action is for [place] to discuss her case at [facility] in the coming weeks.   I will tentatively plan a 29-monthfollow-up MRI/MRCP to see what the pancreas looks like after our cyst aspiration.   This will then help [place] what follow-up needs to be done from there.   We will make a copy of these records and have them scanned in the chart and also sent to the patient for her own records (via mail).   I will update the patient's primary gastroenterology  team as well.   GJustice Britain MD [facility]      [facility] and [facility] and pathology was reviewed.   Consensus from the group was that in most instances, this particular cyst with findings on cytology would be recommended for consideration of intervention due to high risk nature and mucinous cystic neoplasm setting.  With this being said, at her current age, her comorbidities, her asymptomatic nature, it would be reasonable to consider surveillance.  Our hepatobiliary surgeons would be happy to meet the patient as well.   I called and spoke with the patient about these results in this discussion.  She is in agreement that surgery at this point is not something she wants due to her asymptomatic nature but she is interested in monitoring in case something else were to develop or change.  She is okay with holding off on hepatobiliary referral at this time.   At this point our plan will be to repeat MRI/MRCP at a 414-monthnterval from her last EUS.  If there is no dramatic change in the cyst itself, then we will plan a 6-20-monthllow-up and if things are stable at that point we will plan a 1 year surveillance thereafter.  She will obtain a CA 19-9 at the time of her follow-up MRI/MRCP as well and we will make further discussions or decisions from there.   The patient understands that not undergoing surgery could mean that  a premalignant cyst, as this seems to be, may have the risk of developing into cancer but due to her asymptomatic nature she is willing to accept this currently.   I will forward this information to the patient's primary gastroenterology team so they are aware.  My office can work on coordinating the follow-up imaging and lab.   Justice Britain, MD [facility] Office # [phone]        Medications   Current Outpatient Medications  Medication Sig Dispense  Refill   acetaminophen (TYLENOL) 650 MG CR tablet Take 1,300 mg by mouth in the morning and at bedtime.     aspirin EC 81 MG tablet Take 81 mg by mouth daily. Swallow whole.     atorvastatin (LIPITOR) 80 MG tablet Take 80 mg by mouth daily at 6 PM.     ciclopirox (PENLAC) 8 % solution Apply 1 Application topically at bedtime. Apply over nail and surrounding skin. Apply daily over previous coat. After seven (7) days, may remove with alcohol and continue cycle.     diclofenac Sodium (VOLTAREN) 1 % GEL Apply 1 Application topically 4 (four) times daily as needed for pain.     docusate sodium (COLACE) 50 MG capsule Take 50 mg by mouth daily as needed for mild constipation.     famotidine (PEPCID) 20 MG tablet One after supper 30 tablet 11   furosemide (LASIX) 40 MG tablet Take 1 tablet (40 mg total) by mouth daily. 90 tablet 3   glipiZIDE (GLUCOTROL XL) 5 MG 24 hr tablet Take 5 mg by mouth 2 (two) times daily.     latanoprost (XALATAN) 0.005 % ophthalmic solution Place 1 drop into both eyes at bedtime.     loratadine (CLARITIN) 10 MG tablet Take 10 mg by mouth daily.     Menthol-Methyl Salicylate (SALONPAS PAIN RELIEF PATCH EX) Apply 1 patch topically daily as needed (pain).     metoprolol succinate (TOPROL-XL) 25 MG 24 hr tablet Take 25 mg by mouth daily.     nitroGLYCERIN (NITROSTAT) 0.4 MG SL tablet Place 1 tablet (0.4 mg total) under the tongue every 5 (five) minutes as needed. 25 tablet 3   pantoprazole (PROTONIX) 40 MG tablet Take 1 tablet (40 mg total) by mouth daily. Take 30-60 min before first meal of the day 30 tablet 2   valsartan (DIOVAN) 160 MG tablet Take 1 tablet (160 mg total) by mouth daily. 90 tablet 3   No current facility-administered medications for this visit.    Allergies   Allergies as of 05/17/2022 - Review Complete 05/11/2022  Allergen Reaction Noted   Other Itching 03/28/2022   Codeine Nausea Only 10/17/2014   Milk-related compounds Diarrhea 11/03/2015     Past  Medical History   Past Medical History:  Diagnosis Date   CAD (coronary artery disease)    a. s/p NSTEMI in 07/2021 with cath showing minimal CAD and no culprit lesion identified   Cramps, extremity    legs and hands   Diabetes ([place])    type II   Enterococcus UTI 02/03/2016   H/O cardiovascular stress test 03/05/2009   normal, no ischemia   H/O Doppler ultrasound    lower ext arterial doppler 04/13/09-no evidence of arterial insufficiency, normal values; distal aorta 2.4x2.7   HTN (hypertension)    Murmur, cardiac    echo 02/25/13- EF 60-65%, impaired relaxation-grade 1 diastolic dysfunction, mild concentric lvh, mild to moderate aortic regurgitation   Myocardial infarction ([place])  january   Pneumonia    May 2017   Postoperative anemia due to acute blood loss 02/02/2016   Primary localized osteoarthritis of right knee    TIA (transient ischemic attack)    carotid doppler 05/08/11-normal patency    Past Surgical History   Past Surgical History:  Procedure Laterality Date   ABDOMINAL HYSTERECTOMY     BIOPSY  03/31/2022   Procedure: BIOPSY;  Surgeon: Irving Copas., MD;  Location: [facility];  Service: Gastroenterology;;   Lillard Anes Left    COLONOSCOPY     ESOPHAGOGASTRODUODENOSCOPY (EGD) WITH PROPOFOL N/A 03/31/2022   Procedure: ESOPHAGOGASTRODUODENOSCOPY (EGD) WITH PROPOFOL;  Surgeon: Irving Copas., MD;  Location: [facility];  Service: Gastroenterology;  Laterality: N/A;   EUS N/A 03/31/2022   Procedure: UPPER ENDOSCOPIC ULTRASOUND (EUS) LINEAR;  Surgeon: Irving Copas., MD;  Location: [facility];  Service: Gastroenterology;  Laterality: N/A;   FINE NEEDLE ASPIRATION  03/31/2022   Procedure: FINE NEEDLE ASPIRATION (FNA) LINEAR;  Surgeon: Irving Copas., MD;  Location: [facility];  Service: Gastroenterology;;   Azzie Almas DILATION N/A 03/31/2022   Procedure: Azzie Almas DILATION;  Surgeon: Irving Copas., MD;  Location: [facility];   Service: Gastroenterology;  Laterality: N/A;   TOTAL KNEE ARTHROPLASTY Right 02/01/2016   Procedure: TOTAL KNEE ARTHROPLASTY;  Surgeon: Elsie Saas, MD;  Location: [facility];  Service: Orthopedics;  Laterality: Right;    Past Family History   Family History  Problem Relation Age of Onset   Stroke Mother    Cancer Father    Lung cancer Father    Cancer Sister    Diabetes Brother    Diabetes Brother    Diabetes Brother    Pancreatic cancer Neg Hx     Past Social History   Social History   Socioeconomic History   Marital status: Widowed    Spouse name: Not on file   Number of children: 2   Years of education: Not on file   Highest education level: Not on file  Occupational History   Not on file  Tobacco Use   Smoking status: Never   Smokeless tobacco: Never  Vaping Use   Vaping Use: Never used  Substance and Sexual Activity   Alcohol use: No    Alcohol/week: 0.0 standard drinks of alcohol   Drug use: No   Sexual activity: Not on file  Other Topics Concern   Not on file  Social History Narrative   Step son lives with her.  She is a widow.   Social Determinants of Health   Financial Resource Strain: Not on file  Food Insecurity: Not on file  Transportation Needs: Not on file  Physical Activity: Not on file  Stress: Not on file  Social Connections: Not on file  Intimate Partner Violence: Not on file    Review of Systems   General: Negative for anorexia, weight loss, fever, chills, fatigue, weakness. ENT: Negative for hoarseness, difficulty swallowing , nasal congestion. CV: Negative for chest pain, angina, palpitations, dyspnea on exertion, peripheral edema.  Respiratory: Negative for dyspnea at rest, dyspnea on exertion, cough, sputum, wheezing.  GI: See history of present illness. GU:  Negative for dysuria, hematuria, urinary incontinence, urinary frequency, nocturnal urination.  Endo: Negative for unusual weight change.     Physical Exam   There were no  vitals taken for this visit.   General: Well-nourished, well-developed in no acute distress.  Eyes: No icterus. Mouth: Oropharyngeal mucosa moist and pink , no lesions erythema  or exudate. Lungs: Clear to auscultation bilaterally.  Heart: Regular rate and rhythm, no murmurs rubs or gallops.  Abdomen: Bowel sounds are normal, nontender, nondistended, no hepatosplenomegaly or masses,  no abdominal bruits or hernia , no rebound or guarding.  Rectal: ***  Extremities: No lower extremity edema. No clubbing or deformities. Neuro: Alert and oriented x 4   Skin: Warm and dry, no jaundice.   Psych: Alert and cooperative, normal mood and affect.  Labs   *** Imaging Studies   No results found.  Assessment       PLAN   ***   Laureen Ochs. Bobby Rumpf, [place], [facility]

## 2022-05-17 ENCOUNTER — Ambulatory Visit: Payer: Medicare HMO | Admitting: Gastroenterology

## 2022-05-18 ENCOUNTER — Ambulatory Visit: Payer: Medicare HMO | Admitting: Internal Medicine

## 2022-05-18 DIAGNOSIS — I10 Essential (primary) hypertension: Secondary | ICD-10-CM | POA: Diagnosis not present

## 2022-05-18 DIAGNOSIS — Z299 Encounter for prophylactic measures, unspecified: Secondary | ICD-10-CM | POA: Diagnosis not present

## 2022-05-18 DIAGNOSIS — Z23 Encounter for immunization: Secondary | ICD-10-CM | POA: Diagnosis not present

## 2022-05-18 DIAGNOSIS — E1165 Type 2 diabetes mellitus with hyperglycemia: Secondary | ICD-10-CM | POA: Diagnosis not present

## 2022-05-18 DIAGNOSIS — J439 Emphysema, unspecified: Secondary | ICD-10-CM | POA: Diagnosis not present

## 2022-05-18 NOTE — Progress Notes (Deleted)
[name], female    DOB: [birth year]    MRN: [id number]   Brief patient profile:  56 yowf never smoker with allergic rhinitis worse in spring referred to pulmonary clinic in [place]  01/13/2022 by Clyda Hurdle for sob > cough   Prior eval and rx by Dr Shearon Stalls for sob and cough x Nov 2020 (says with covid, not admitted)  assoc with pnds no better with prednisone and never saw allergy / initially otc antihistamines were helping  but not anymore    History of Present Illness  01/13/2022  Pulmonary/ 1st office eval/ Melvyn Novas / [facility]  Chief Complaint  Patient presents with   New Patient (Initial Visit)    Was seeing Dr. Warner Mccreedy in [facility] office. Patient states she was seeing him for cough and SOB.  Dyspnea:  assoc with cough since  Nov 2020 / says could only do "3 laps" at end of cardiac  rehab was supposed to do 6 - walking one side of house to the other (rehab notes say she did 800 feet in 6 min with lowest sats 90% on 12/17/21 last session)  Cough: typically worse p walks/ very small amt mucoid sputum, worse with voice use  Sleep: on back/side / bed is flat does not wake up with excess mucus  SABA use: none   Rec Pantoprazole (protonix) 40 mg   Take  30-60 min before first meal of the day and Pepcid (famotidine)  20 mg after supper  GERD  diet/bed blocks Labs: Eos 0.1 /  IgE 256  > referred to allergy > does not recall being referred)  Esr  48      02/23/2022  f/u ov/[place] office/Nasim Habeeb re: onset doe Nov 2020 p covid / bronchiectasis on CT maint on gerd rx   Chief Complaint  Patient presents with   Follow-up    Breathing and cough are about the same since last ov  Having a lot of foot pain in right foot.   Dyspnea:  now  pushing buggy at [employer], using [facility] parking once a week  Cough: some cough at hs but does not keep her up, some worse p stirring in am/ with deep breaths or signing  / still using cough drops  Allegra didn't help  Sleeping: bed is flat with pillows   SABA use: none  02: none  Covid status: x 4 vax  Rec Make sure you check your oxygen saturation at your highest level of activity to be sure it stays over 90%  Add zyrtec 10 mg an hour before bed (can take it same time famotidine)  My office will be contacting you by phone for referral to Allergy   GERD rx    05/18/2022  f/u ov/[place] office/Aiya Keach re: bronchiectasis  maint on ***  No chief complaint on file.   Dyspnea:  *** Cough: *** Sleeping: *** SABA use: *** 02: *** Covid status: *** Lung cancer screening: ***   No obvious day to day or daytime variability or assoc excess/ purulent sputum or mucus plugs or hemoptysis or cp or chest tightness, subjective wheeze or overt sinus or hb symptoms.   *** without nocturnal  or early am exacerbation  of respiratory  c/o's or need for noct saba. Also denies any obvious fluctuation of symptoms with weather or environmental changes or other aggravating or alleviating factors except as outlined above   No unusual exposure hx or h/o childhood pna/ asthma or knowledge of premature birth.  Current Allergies, Complete Past Medical History, Past Surgical History, Family History, and Social History were reviewed in [employer] record.  ROS  The following are not active complaints unless bolded Hoarseness, sore throat, dysphagia, dental problems, itching, sneezing,  nasal congestion or discharge of excess mucus or purulent secretions, ear ache,   fever, chills, sweats, unintended wt loss or wt gain, classically pleuritic or exertional cp,  orthopnea pnd or arm/hand swelling  or leg swelling, presyncope, palpitations, abdominal pain, anorexia, nausea, vomiting, diarrhea  or change in bowel habits or change in bladder habits, change in stools or change in urine, dysuria, hematuria,  rash, arthralgias, visual complaints, headache, numbness, weakness or ataxia or problems with walking or coordination,  change in mood or   memory.        No outpatient medications have been marked as taking for the 05/18/22 encounter (Appointment) with Tanda Rockers, MD.            Past Medical History:  Diagnosis Date   CAD (coronary artery disease)    a. s/p NSTEMI in 07/2021 with cath showing minimal CAD and no culprit lesion identified   Cramps, extremity    legs and hands   Diabetes ([place])    type II   Enterococcus UTI 02/03/2016   H/O cardiovascular stress test 03/05/2009   normal, no ischemia   H/O Doppler ultrasound    lower ext arterial doppler 04/13/09-no evidence of arterial insufficiency, normal values; distal aorta 2.4x2.7   HTN (hypertension)    Murmur, cardiac    echo 02/25/13- EF 60-65%, impaired relaxation-grade 1 diastolic dysfunction, mild concentric lvh, mild to moderate aortic regurgitation   Pneumonia    May 2017   Postoperative anemia due to acute blood loss 02/02/2016   Primary localized osteoarthritis of right knee    TIA (transient ischemic attack)    carotid doppler 05/08/11-normal patency        Objective:      05/18/2022        ***   02/23/22 163 lb 12.8 oz (74.3 kg)  01/13/22 163 lb (73.9 kg)  01/13/22 163 lb (73.9 kg)    Vital signs reviewed  05/18/2022  - Note at rest 02 sats  ***% on ***   General appearance:    ***     minimal insp rhonchi  bilateral bases ***            Chemistry      Component Value Date/Time   NA 142 01/13/2022 1555   K 4.2 01/13/2022 1555   CL 99 01/13/2022 1555   CO2 22 01/13/2022 1555   BUN 36 (H) 01/13/2022 1555   CREATININE 1.72 (H) 01/13/2022 1555   CREATININE 1.25 (H) 03/01/2013 1034      Component Value Date/Time   CALCIUM 9.6 01/13/2022 1555   ALKPHOS 114 01/22/2016 1409   AST 24 01/22/2016 1409   ALT 18 01/22/2016 1409   BILITOT 0.9 01/22/2016 1409         Lab Results  Component Value Date   ESRSEDRATE 48 (H) 01/13/2022           Assessment

## 2022-05-25 ENCOUNTER — Ambulatory Visit (HOSPITAL_COMMUNITY)
Admission: RE | Admit: 2022-05-25 | Discharge: 2022-05-25 | Disposition: A | Discharge status: Home / Self Care | Payer: Medicare HMO | Source: Ambulatory Visit | Attending: Internal Medicine | Admitting: Internal Medicine

## 2022-05-25 DIAGNOSIS — R0609 Other forms of dyspnea: Secondary | ICD-10-CM | POA: Insufficient documentation

## 2022-05-25 LAB — ECHOCARDIOGRAM COMPLETE
AR max vel: 2.16 cm2
AV Area VTI: 2.09 cm2
AV Area mean vel: 2.26 cm2
AV Mean grad: 3.3 mmHg
AV Peak grad: 7.1 mmHg
Ao pk vel: 1.34 m/s
Area-P 1/2: 2.91 cm2
P 1/2 time: 517 ms
S' Lateral: 3.2 cm

## 2022-05-25 NOTE — Progress Notes (Signed)
*  PRELIMINARY RESULTS* Echocardiogram 2D Echocardiogram has been performed.  [name] 05/25/2022, 10:36 AM

## 2022-05-26 DIAGNOSIS — B351 Tinea unguium: Secondary | ICD-10-CM | POA: Diagnosis not present

## 2022-05-26 DIAGNOSIS — L84 Corns and callosities: Secondary | ICD-10-CM | POA: Diagnosis not present

## 2022-05-26 DIAGNOSIS — E1142 Type 2 diabetes mellitus with diabetic polyneuropathy: Secondary | ICD-10-CM | POA: Diagnosis not present

## 2022-05-26 DIAGNOSIS — M79676 Pain in unspecified toe(s): Secondary | ICD-10-CM | POA: Diagnosis not present

## 2022-06-08 DIAGNOSIS — E1165 Type 2 diabetes mellitus with hyperglycemia: Secondary | ICD-10-CM | POA: Diagnosis not present

## 2022-06-08 DIAGNOSIS — I10 Essential (primary) hypertension: Secondary | ICD-10-CM | POA: Diagnosis not present

## 2022-06-15 DIAGNOSIS — Z961 Presence of intraocular lens: Secondary | ICD-10-CM | POA: Diagnosis not present

## 2022-06-15 DIAGNOSIS — H401432 Capsular glaucoma with pseudoexfoliation of lens, bilateral, moderate stage: Secondary | ICD-10-CM | POA: Diagnosis not present

## 2022-07-08 DIAGNOSIS — E1165 Type 2 diabetes mellitus with hyperglycemia: Secondary | ICD-10-CM | POA: Diagnosis not present

## 2022-07-08 DIAGNOSIS — I10 Essential (primary) hypertension: Secondary | ICD-10-CM | POA: Diagnosis not present

## 2022-07-13 ENCOUNTER — Encounter: Payer: Self-pay | Admitting: Internal Medicine

## 2022-07-13 ENCOUNTER — Ambulatory Visit (INDEPENDENT_AMBULATORY_CARE_PROVIDER_SITE_OTHER): Payer: PPO | Admitting: Internal Medicine

## 2022-07-13 VITALS — BP 132/88 | HR 90 | Temp 97.6°F | Wt 163.8 lb

## 2022-07-13 DIAGNOSIS — R053 Chronic cough: Secondary | ICD-10-CM

## 2022-07-13 DIAGNOSIS — R0609 Other forms of dyspnea: Secondary | ICD-10-CM

## 2022-07-13 MED ORDER — GABAPENTIN 100 MG PO CAPS: 100.0000 mg | ORAL_CAPSULE | Freq: Three times a day (TID) | ORAL | 2 refills | Status: DC

## 2022-07-13 NOTE — Progress Notes (Unsigned)
[name], female    DOB: 07/02/1935    MRN: [id number]   Brief patient profile:  89 yowf never smoker with allergic rhinitis worse in spring referred to pulmonary clinic in [place]  01/13/2022 by Clyda Hurdle for sob > cough   Prior eval and rx by Dr Shearon Stalls for sob and cough x Nov 2020 (says with covid, not admitted)  assoc with pnds no better with prednisone and never saw allergy / initially otc antihistamines were helping  but not anymore    History of Present Illness  01/13/2022  Pulmonary/ 1st office eval/ Melvyn Novas / [facility]  Chief Complaint  Patient presents with   New Patient (Initial Visit)    Was seeing Dr. Warner Mccreedy in [facility] office. Patient states she was seeing him for cough and SOB.  Dyspnea:  assoc with cough since  Nov 2020 / says could only do "3 laps" at end of cardiac  rehab was supposed to do 6 - walking one side of house to the other (rehab notes say she did 800 feet in 6 min with lowest sats 90% on 12/17/21 last session)  Cough: typically worse p walks/ very small amt mucoid sputum, worse with voice use  Sleep: on back/side / bed is flat does not wake up with excess mucus  SABA use: none   Rec Pantoprazole (protonix) 40 mg   Take  30-60 min before first meal of the day and Pepcid (famotidine)  20 mg after supper  GERD  diet/bed blocks Labs: Eos 0.1 /  IgE 256  > referred to allergy > does not recall being referred)  Esr  48    02/23/2022  f/u ov/[place] office/Talik Casique re: onset doe Nov 2020 p covid / bronchiectasis on CT   maint on gerd rx   Chief Complaint  Patient presents with   Follow-up    Breathing and cough are about the same since last ov  Having a lot of foot pain in right foot.   Dyspnea:  now  pushing buggy at [employer], using [facility] parking once a week  Cough: some cough at hs but does not keep her up, some worse p stirring in am/ with deep breaths or signing  / still using cough drops  Allegra didn't help  Sleeping: bed is flat with pillows   SABA use: none  02: none  Covid status: x 4 vax  Rec Make sure you check your oxygen saturation at your highest level of activity to be sure it stays over 90% and keep track of it at least once a week, more often if breathing getting worse, and let me know if losing ground.  Add zyrtec 10 mg an hour before bed (can take it same time famotidine)  My office will be contacting you by phone for referral to Allergy   GERD rx       07/13/2022  f/u ov/[place] office/Yesli Vanderhoff re: bronchiectasis but cc is cough when talking  maint on gerd rx and antihistamines and no allergy eval yet   Chief Complaint  Patient presents with   Follow-up    Breathing and cough are same since last ov   Dyspnea:  no change in doe/ tolerable walks with cane  Cough: still worse at bedtime but then resolves  Sleeping: flat bed/ 2 pillows under head  SABA use: none  02: none/      No obvious day to day or daytime variability or assoc excess/ purulent sputum or mucus plugs  or hemoptysis or cp or chest tightness, subjective wheeze or overt sinus or hb symptoms.   Sleeping  without nocturnal  or early am exacerbation  of respiratory  c/o's or need for noct saba. Also denies any obvious fluctuation of symptoms with weather or environmental changes or other aggravating or alleviating factors except as outlined above   No unusual exposure hx or h/o childhood pna/ asthma or knowledge of premature birth.  Current Allergies, Complete Past Medical History, Past Surgical History, Family History, and Social History were reviewed in [employer] record.  ROS  The following are not active complaints unless bolded Hoarseness, sore throat, dysphagia, dental problems, itching, sneezing,  nasal congestion or discharge of excess mucus or purulent secretions, ear ache,   fever, chills, sweats, unintended wt loss or wt gain, classically pleuritic or exertional cp,  orthopnea pnd or arm/hand swelling  or leg  swelling, presyncope, palpitations, abdominal pain, anorexia, nausea, vomiting, diarrhea  or change in bowel habits or change in bladder habits, change in stools or change in urine, dysuria, hematuria,  rash, arthralgias, visual complaints, headache, numbness, weakness or ataxia or problems with walking or coordination,  change in mood or  memory.        Current Meds  Medication Sig   acetaminophen (TYLENOL) 650 MG CR tablet Take 1,300 mg by mouth in the morning and at bedtime.   aspirin EC 81 MG tablet Take 81 mg by mouth daily. Swallow whole.   atorvastatin (LIPITOR) 80 MG tablet Take 80 mg by mouth daily at 6 PM.   ciclopirox (PENLAC) 8 % solution Apply 1 Application topically at bedtime. Apply over nail and surrounding skin. Apply daily over previous coat. After seven (7) days, may remove with alcohol and continue cycle.   diclofenac Sodium (VOLTAREN) 1 % GEL Apply 1 Application topically 4 (four) times daily as needed for pain.   docusate sodium (COLACE) 50 MG capsule Take 50 mg by mouth daily as needed for mild constipation.   famotidine (PEPCID) 20 MG tablet One after supper   furosemide (LASIX) 40 MG tablet Take 1 tablet (40 mg total) by mouth daily.   glipiZIDE (GLUCOTROL XL) 5 MG 24 hr tablet Take 5 mg by mouth 2 (two) times daily.   latanoprost (XALATAN) 0.005 % ophthalmic solution Place 1 drop into both eyes at bedtime.   loratadine (CLARITIN) 10 MG tablet Take 10 mg by mouth daily.   Menthol-Methyl Salicylate (SALONPAS PAIN RELIEF PATCH EX) Apply 1 patch topically daily as needed (pain).   metoprolol succinate (TOPROL-XL) 25 MG 24 hr tablet Take 25 mg by mouth daily.   nitroGLYCERIN (NITROSTAT) 0.4 MG SL tablet Place 1 tablet (0.4 mg total) under the tongue every 5 (five) minutes as needed.   pantoprazole (PROTONIX) 40 MG tablet Take 1 tablet (40 mg total) by mouth daily. Take 30-60 min before first meal of the day   valsartan (DIOVAN) 160 MG tablet Take 1 tablet (160 mg total) by  mouth daily.           Past Medical History:  Diagnosis Date   CAD (coronary artery disease)    a. s/p NSTEMI in 07/2021 with cath showing minimal CAD and no culprit lesion identified   Cramps, extremity    legs and hands   Diabetes ([place])    type II   Enterococcus UTI 02/03/2016   H/O cardiovascular stress test 03/05/2009   normal, no ischemia   H/O Doppler ultrasound    lower  ext arterial doppler 04/13/09-no evidence of arterial insufficiency, normal values; distal aorta 2.4x2.7   HTN (hypertension)    Murmur, cardiac    echo 02/25/13- EF 60-65%, impaired relaxation-grade 1 diastolic dysfunction, mild concentric lvh, mild to moderate aortic regurgitation   Pneumonia    May 2017   Postoperative anemia due to acute blood loss 02/02/2016   Primary localized osteoarthritis of right knee    TIA (transient ischemic attack)    carotid doppler 05/08/11-normal patency        Objective:    07/13/2022         163  02/23/22 163 lb 12.8 oz (74.3 kg)  01/13/22 163 lb (73.9 kg)  01/13/22 163 lb (73.9 kg)    Vital signs reviewed  07/13/2022  - Note at rest 02 sats  96% on RA   General appearance:    amb wf easily confused with details of care    Nl exam / djd ***   HEENT : Oropharynx  ***     Nasal turbinates ***   NECK :  without  apparent JVD/ palpable Nodes/TM    LUNGS: no acc muscle use,  Nl contour chest which is clear to A and P bilaterally without cough on insp or exp maneuvers   CV:  RRR  no s3 or murmur or increase in P2, and no edema   ABD:  soft and nontender with nl inspiratory excursion in the supine position. No bruits or organomegaly appreciated   MS:  Nl gait/ ext warm with  DJD  deformities both hands s   calf tenderness, cyanosis or clubbing    SKIN: warm and dry without lesions    NEURO:  alert, approp, nl sensorium with  no motor or cerebellar deficits apparent.         I personally reviewed images and agree with radiology impression as follows:    Chest CT w/o contrast 12/10/21 Scattered areas of subpleural reticulation and ground-glass. Septal thickening at the lung bases with signs of bronchiectasis. No signs of honeycombing.           Assessment

## 2022-07-13 NOTE — Patient Instructions (Addendum)
My office will be contacting you by phone for referral to Allergist   - if you don't hear back from my office within one week please call us back or notify us thru MyChart and we'll address it right away.   Gabapentin 100 mg three times a day - stop it if balance worsens  Make sure you check your oxygen saturation at your highest level of activity  (NOT after you stop)  to be sure it stays over 90% and keep track of it at least once a week, more often if breathing getting worse, and let me know if losing ground. (Collect the dots to connect the dots approach)    Please schedule a follow up visit in 3 months but call sooner if needed - bring your cane   Late add : proceed with HRCT this month

## 2022-07-14 ENCOUNTER — Telehealth: Payer: Self-pay | Admitting: Internal Medicine

## 2022-07-14 DIAGNOSIS — R0609 Other forms of dyspnea: Secondary | ICD-10-CM

## 2022-07-14 NOTE — Telephone Encounter (Signed)
Order placed

## 2022-07-14 NOTE — Assessment & Plan Note (Signed)
Onset nov 2020 (? covid related)  assoc with chronic PNDS  Worse in spring  -  Echo 07/27/21   The left ventricular size is normal.  There is normal left ventricular wall thickness.  Left ventricular systolic function is normal.  The right ventricle is normal in size and function.  The left atrial size is normal.  There is mild aortic regurgitation.  There is mild mitral regurgitation.  - LHC  07/18/21  Min CAD/ no pressures obtained  - CT (not HRCT)  12/10/21 > Findings of suspected chronic interstitial lung disease. Findings are worse when compared to Nov 15, 2015. - 01/13/2022   Walked on RA  x  3  lap(s) =  approx 450  ft  @ slow/cane pace, stopped due to end of study with mild sob and Knee pain  with lowest 02 sats 92%   - 07/13/2022   Walked on RA  x  3  lap(s) =  approx 450  ft  @ slow/unsteady pace, stopped due to endo of study with lowest 02 sats 93% and mild sob on last lap   Clearly multifactorial with conditioning a major concern and possible ILD progression > will proceed with HRCT but purse UACS as cause for the cough as it does not occur on insp maneuvers which would be typical of ILD   Each maintenance medication was reviewed in detail including emphasizing most importantly the difference between maintenance and prns and under what circumstances the prns are to be triggered using an action plan format where appropriate.  Total time for H and P, chart review, counseling,  directly observing portions of ambulatory 02 saturation study/ and generating customized AVS unique to this office visit / same day charting > 30 min for multiple  refractory respiratory  symptoms of uncertain etiology

## 2022-07-14 NOTE — Assessment & Plan Note (Addendum)
Onset Nov 2020 assoc with sensation of pnds but not better on antihistamines or steroids - assoc bronchiectasis on CT 12/10/21  - Allergy screen 01/13/2022 >  Eos 0.1 /  IgE 256  > referred to allergy 02/23/2022 >>> again 07/13/2022  - added gabapentin trial 07/13/2022  @ 100 mg tid if tol   While awaiting allergy eval  suggested zyrtec otc trial rather than clariton

## 2022-07-14 NOTE — Telephone Encounter (Signed)
After chart review rec HRCT chest this month, no rush to f/u the one she had in June dx doe

## 2022-08-03 ENCOUNTER — Other Ambulatory Visit: Payer: Self-pay | Admitting: Internal Medicine

## 2022-08-08 DIAGNOSIS — J439 Emphysema, unspecified: Secondary | ICD-10-CM | POA: Diagnosis not present

## 2022-08-08 DIAGNOSIS — E1165 Type 2 diabetes mellitus with hyperglycemia: Secondary | ICD-10-CM | POA: Diagnosis not present

## 2022-08-08 DIAGNOSIS — I10 Essential (primary) hypertension: Secondary | ICD-10-CM | POA: Diagnosis not present

## 2022-08-08 DIAGNOSIS — Z299 Encounter for prophylactic measures, unspecified: Secondary | ICD-10-CM | POA: Diagnosis not present

## 2022-08-08 DIAGNOSIS — I700 Atherosclerosis of aorta: Secondary | ICD-10-CM | POA: Diagnosis not present

## 2022-08-08 DIAGNOSIS — B351 Tinea unguium: Secondary | ICD-10-CM | POA: Diagnosis not present

## 2022-08-18 DIAGNOSIS — R0981 Nasal congestion: Secondary | ICD-10-CM | POA: Diagnosis not present

## 2022-08-18 DIAGNOSIS — J069 Acute upper respiratory infection, unspecified: Secondary | ICD-10-CM | POA: Diagnosis not present

## 2022-08-22 ENCOUNTER — Other Ambulatory Visit: Payer: Self-pay

## 2022-08-22 ENCOUNTER — Emergency Department (HOSPITAL_COMMUNITY)
Admission: EM | Admit: 2022-08-22 | Discharge: 2022-08-22 | Discharge status: Against Medical Advice | Payer: HMO | Attending: Emergency Medicine | Admitting: Emergency Medicine

## 2022-08-22 DIAGNOSIS — Z5321 Procedure and treatment not carried out due to patient leaving prior to being seen by health care provider: Secondary | ICD-10-CM | POA: Insufficient documentation

## 2022-08-22 DIAGNOSIS — M79661 Pain in right lower leg: Secondary | ICD-10-CM | POA: Insufficient documentation

## 2022-08-22 DIAGNOSIS — M79662 Pain in left lower leg: Secondary | ICD-10-CM | POA: Insufficient documentation

## 2022-08-22 DIAGNOSIS — M5431 Sciatica, right side: Secondary | ICD-10-CM | POA: Insufficient documentation

## 2022-08-22 NOTE — ED Triage Notes (Signed)
Pt via POV from home c/o bilateral lower leg pain/burning sensation intermittently since last night while she was sleeping. She says she woke up feeling like her legs were very hot and burning despite only light covers on the bed. This resolved in the morning but started again this afternoon at around 4pm. Pt is also having right-sided sciatica pain. She does have hx gait instability and uses a cane to ambulate.

## 2022-08-23 DIAGNOSIS — M7989 Other specified soft tissue disorders: Secondary | ICD-10-CM | POA: Diagnosis not present

## 2022-08-23 DIAGNOSIS — I10 Essential (primary) hypertension: Secondary | ICD-10-CM | POA: Diagnosis not present

## 2022-08-23 DIAGNOSIS — Z299 Encounter for prophylactic measures, unspecified: Secondary | ICD-10-CM | POA: Diagnosis not present

## 2022-08-25 DIAGNOSIS — E1142 Type 2 diabetes mellitus with diabetic polyneuropathy: Secondary | ICD-10-CM | POA: Diagnosis not present

## 2022-08-25 DIAGNOSIS — L603 Nail dystrophy: Secondary | ICD-10-CM | POA: Diagnosis not present

## 2022-08-25 DIAGNOSIS — L84 Corns and callosities: Secondary | ICD-10-CM | POA: Diagnosis not present

## 2022-08-25 DIAGNOSIS — M79676 Pain in unspecified toe(s): Secondary | ICD-10-CM | POA: Diagnosis not present

## 2022-08-26 ENCOUNTER — Other Ambulatory Visit: Payer: Self-pay | Admitting: Internal Medicine

## 2022-08-26 DIAGNOSIS — Z1211 Encounter for screening for malignant neoplasm of colon: Secondary | ICD-10-CM | POA: Diagnosis not present

## 2022-08-26 DIAGNOSIS — I10 Essential (primary) hypertension: Secondary | ICD-10-CM | POA: Diagnosis not present

## 2022-08-26 DIAGNOSIS — E1165 Type 2 diabetes mellitus with hyperglycemia: Secondary | ICD-10-CM | POA: Diagnosis not present

## 2022-08-26 DIAGNOSIS — Z0000 Encounter for general adult medical examination without abnormal findings: Secondary | ICD-10-CM | POA: Diagnosis not present

## 2022-08-26 DIAGNOSIS — Z299 Encounter for prophylactic measures, unspecified: Secondary | ICD-10-CM | POA: Diagnosis not present

## 2022-08-26 DIAGNOSIS — E7800 Pure hypercholesterolemia, unspecified: Secondary | ICD-10-CM | POA: Diagnosis not present

## 2022-08-26 DIAGNOSIS — R5383 Other fatigue: Secondary | ICD-10-CM | POA: Diagnosis not present

## 2022-08-26 DIAGNOSIS — Z7189 Other specified counseling: Secondary | ICD-10-CM | POA: Diagnosis not present

## 2022-08-26 DIAGNOSIS — Z79899 Other long term (current) drug therapy: Secondary | ICD-10-CM | POA: Diagnosis not present

## 2022-08-26 DIAGNOSIS — Z1339 Encounter for screening examination for other mental health and behavioral disorders: Secondary | ICD-10-CM | POA: Diagnosis not present

## 2022-08-26 DIAGNOSIS — Z6830 Body mass index (BMI) 30.0-30.9, adult: Secondary | ICD-10-CM | POA: Diagnosis not present

## 2022-08-26 DIAGNOSIS — Z1331 Encounter for screening for depression: Secondary | ICD-10-CM | POA: Diagnosis not present

## 2022-08-30 ENCOUNTER — Ambulatory Visit (HOSPITAL_COMMUNITY)
Admission: RE | Admit: 2022-08-30 | Discharge: 2022-08-30 | Disposition: A | Discharge status: Home / Self Care | Payer: HMO | Source: Ambulatory Visit | Attending: Internal Medicine | Admitting: Internal Medicine

## 2022-08-30 DIAGNOSIS — R160 Hepatomegaly, not elsewhere classified: Secondary | ICD-10-CM | POA: Diagnosis not present

## 2022-08-30 DIAGNOSIS — J479 Bronchiectasis, uncomplicated: Secondary | ICD-10-CM | POA: Diagnosis not present

## 2022-08-30 DIAGNOSIS — R0609 Other forms of dyspnea: Secondary | ICD-10-CM | POA: Insufficient documentation

## 2022-08-30 DIAGNOSIS — J84112 Idiopathic pulmonary fibrosis: Secondary | ICD-10-CM | POA: Diagnosis not present

## 2022-08-30 DIAGNOSIS — I700 Atherosclerosis of aorta: Secondary | ICD-10-CM | POA: Diagnosis not present

## 2022-09-01 ENCOUNTER — Other Ambulatory Visit: Payer: Self-pay

## 2022-09-01 ENCOUNTER — Other Ambulatory Visit (HOSPITAL_COMMUNITY): Payer: Self-pay | Admitting: Internal Medicine

## 2022-09-01 ENCOUNTER — Encounter: Payer: Self-pay | Admitting: Internal Medicine

## 2022-09-01 DIAGNOSIS — J84112 Idiopathic pulmonary fibrosis: Secondary | ICD-10-CM | POA: Insufficient documentation

## 2022-09-01 DIAGNOSIS — M7989 Other specified soft tissue disorders: Secondary | ICD-10-CM

## 2022-09-01 DIAGNOSIS — E041 Nontoxic single thyroid nodule: Secondary | ICD-10-CM | POA: Insufficient documentation

## 2022-09-05 ENCOUNTER — Telehealth: Payer: Self-pay | Admitting: Internal Medicine

## 2022-09-05 ENCOUNTER — Ambulatory Visit (INDEPENDENT_AMBULATORY_CARE_PROVIDER_SITE_OTHER): Payer: HMO | Admitting: Internal Medicine

## 2022-09-05 ENCOUNTER — Encounter: Payer: Self-pay | Admitting: Internal Medicine

## 2022-09-05 ENCOUNTER — Other Ambulatory Visit: Payer: Self-pay

## 2022-09-05 VITALS — BP 138/76 | HR 67 | Temp 97.6°F | Resp 16 | Ht 65.75 in | Wt 168.0 lb

## 2022-09-05 DIAGNOSIS — J343 Hypertrophy of nasal turbinates: Secondary | ICD-10-CM | POA: Diagnosis not present

## 2022-09-05 DIAGNOSIS — J3089 Other allergic rhinitis: Secondary | ICD-10-CM

## 2022-09-05 DIAGNOSIS — R0609 Other forms of dyspnea: Secondary | ICD-10-CM

## 2022-09-05 DIAGNOSIS — R053 Chronic cough: Secondary | ICD-10-CM | POA: Diagnosis not present

## 2022-09-05 MED ORDER — FLUTICASONE PROPIONATE 50 MCG/ACT NA SUSP: NASAL | 5 refills | Status: DC

## 2022-09-05 MED ORDER — IPRATROPIUM BROMIDE 0.03 % NA SOLN: NASAL | 5 refills | Status: AC

## 2022-09-05 NOTE — Telephone Encounter (Signed)
*  STAT* If patient is at the pharmacy, call can be transferred to refill team.   1. Which medications need to be refilled? (please list name of each medication and dose if known) furosemide (LASIX) 40 MG tablet   2. Which pharmacy/location (including street and city if local pharmacy) is medication to be sent to?  [place], [place] - [address]    3. Do they need a 30 day or 90 day supply? [place]

## 2022-09-05 NOTE — Patient Instructions (Addendum)
Chronic Rhinitis Chronic Cough with post nasal drainage: - Positive skin test 08/2022: penicillin mold - Avoidance measures discussed. - Use nasal saline rinses before nose sprays such as with Neilmed Sinus Rinse.  Use distilled water.   - Use Flonase 1-2 sprays each nostril daily. Aim upward and outward. - Use Ipratroprium 2 sprays up to three times daily as needed for runny nose. Aim upward and outward.

## 2022-09-05 NOTE — Progress Notes (Signed)
NEW PATIENT  Date of Service/Encounter:  09/05/22  Consult requested by: Glenda Chroman, MD   Subjective:   [name] (DOB: [birth year]) is a 88 y.o. female who presents to the clinic on 09/05/2022 with a chief complaint of Cough (Has been having a cough for at least 3 years. ) .    History obtained from: chart review and patient. She is not the best historian.   Chronic Cough: Reports onset about 3 years ago.  It is worse in the evening and also worse with cold drinks and talking.  Has noted post nasal drainage with this.   She also has dyspnea intermittently with the cough especially with exertion so was referred to [facility].  Dr. Melvyn Novas checked IgE which was 256 and referred to Allergy for further evaluation.  Followed by Cardiology for CAD and mild AI/TR/MR.  Attempted increasing Lasix dose.   She does have some trouble with GERD and was started on PPI by Dr. Melvyn Novas but doesn't think it has helped.  She does have trouble with shortness of breath especially with activity.   Denies leg swelling. Of note, HRCT 08/2022 did show fibrotic ILD similar to UIP.   Rhinitis:  Started about 3 years ago.  Symptoms include: nasal congestion, rhinorrhea, and post nasal drainage  She has a chronic cough with this drainage.  Occurs year-round Potential triggers: not sure Treatments tried:  None  Previous allergy testing: no  History of sinus surgery: no Nonallergic triggers:  eating     Past Medical History: Past Medical History:  Diagnosis Date   CAD (coronary artery disease)    a. s/p NSTEMI in 07/2021 with cath showing minimal CAD and no culprit lesion identified   Cramps, extremity    legs and hands   Diabetes ([place])    type II   Enterococcus UTI 02/03/2016   H/O cardiovascular stress test 03/05/2009   normal, no ischemia   H/O Doppler ultrasound    lower ext arterial doppler 04/13/09-no evidence of arterial insufficiency, normal values; distal aorta 2.4x2.7   HTN  (hypertension)    Murmur, cardiac    echo 02/25/13- EF 60-65%, impaired relaxation-grade 1 diastolic dysfunction, mild concentric lvh, mild to moderate aortic regurgitation   Myocardial infarction [facility])    january   Pneumonia    May 2017   Postoperative anemia due to acute blood loss 02/02/2016   Primary localized osteoarthritis of right knee    Recurrent upper respiratory infection (URI)    TIA (transient ischemic attack)    carotid doppler 05/08/11-normal patency   Past Surgical History: Past Surgical History:  Procedure Laterality Date   ABDOMINAL HYSTERECTOMY     BIOPSY  03/31/2022   Procedure: BIOPSY;  Surgeon: Irving Copas., MD;  Location: [facility];  Service: Gastroenterology;;   Lillard Anes Left    COLONOSCOPY     ESOPHAGOGASTRODUODENOSCOPY (EGD) WITH PROPOFOL N/A 03/31/2022   Procedure: ESOPHAGOGASTRODUODENOSCOPY (EGD) WITH PROPOFOL;  Surgeon: Irving Copas., MD;  Location: [facility];  Service: Gastroenterology;  Laterality: N/A;   EUS N/A 03/31/2022   Procedure: UPPER ENDOSCOPIC ULTRASOUND (EUS) LINEAR;  Surgeon: Irving Copas., MD;  Location: [facility];  Service: Gastroenterology;  Laterality: N/A;   FINE NEEDLE ASPIRATION  03/31/2022   Procedure: FINE NEEDLE ASPIRATION (FNA) LINEAR;  Surgeon: Irving Copas., MD;  Location: [facility];  Service: Gastroenterology;;   Azzie Almas DILATION N/A 03/31/2022   Procedure: Azzie Almas DILATION;  Surgeon: Irving Copas., MD;  Location: [facility];  Service: Gastroenterology;  Laterality: N/A;   TOTAL KNEE ARTHROPLASTY Right 02/01/2016   Procedure: TOTAL KNEE ARTHROPLASTY;  Surgeon: Elsie Saas, MD;  Location: [place];  Service: Orthopedics;  Laterality: Right;    Family History: Family History  Problem Relation Age of Onset   Stroke Mother    Cancer Father    Lung cancer Father    Cancer Sister    Diabetes Brother    Diabetes Brother    Diabetes Brother    Pancreatic cancer Neg Hx     Allergic rhinitis Neg Hx    Angioedema Neg Hx    Asthma Neg Hx    Eczema Neg Hx     Social History:  Lives in a 70 year house Flooring in bedroom: carpet Pets: none Tobacco use/exposure: none Job: retired  Medication List:  Allergies as of 09/05/2022       Reactions   Other Itching   Chest monitor adhesives    Codeine Nausea Only   Milk-related Compounds Diarrhea        Medication List        Accurate as of September 05, 2022  5:07 PM. If you have any questions, ask your nurse or doctor.          acetaminophen 650 MG CR tablet Commonly known as: TYLENOL Take 1,300 mg by mouth in the morning and at bedtime.   aspirin EC 81 MG tablet Take 81 mg by mouth daily. Swallow whole.   atorvastatin 80 MG tablet Commonly known as: LIPITOR Take 80 mg by mouth daily at 6 PM.   ciclopirox 8 % solution Commonly known as: PENLAC Apply 1 Application topically at bedtime. Apply over nail and surrounding skin. Apply daily over previous coat. After seven (7) days, may remove with alcohol and continue cycle.   diclofenac Sodium 1 % Gel Commonly known as: VOLTAREN Apply 1 Application topically 4 (four) times daily as needed for pain.   docusate sodium 50 MG capsule Commonly known as: COLACE Take 50 mg by mouth daily as needed for mild constipation.   famotidine 20 MG tablet Commonly known as: Pepcid One after supper   fluticasone 50 MCG/ACT nasal spray Commonly known as: FLONASE Place 1-2 sprays each nostril daily. Aim upward and outward. Started by: Larose Kells, MD   furosemide 40 MG tablet Commonly known as: LASIX Take 1 tablet (40 mg total) by mouth daily.   gabapentin 100 MG capsule Commonly known as: Neurontin Take 1 capsule (100 mg total) by mouth 3 (three) times daily.   glipiZIDE 5 MG 24 hr tablet Commonly known as: GLUCOTROL XL Take 5 mg by mouth 2 (two) times daily.   ipratropium 0.03 % nasal spray Commonly known as: ATROVENT Place 2 sprays in  each nostril up to three times daily as needed for runny nose Started by: Larose Kells, MD   latanoprost 0.005 % ophthalmic solution Commonly known as: XALATAN Place 1 drop into both eyes at bedtime.   loratadine 10 MG tablet Commonly known as: CLARITIN Take 10 mg by mouth daily.   metoprolol succinate 25 MG 24 hr tablet Commonly known as: TOPROL-XL Take 25 mg by mouth daily.   Mucinex DM 30-600 MG Tb12 Take 1 tablet by mouth 2 (two) times daily.   nitroGLYCERIN 0.4 MG SL tablet Commonly known as: NITROSTAT Place 1 tablet (0.4 mg total) under the tongue every 5 (five) minutes as needed.   pantoprazole 40 MG tablet Commonly known as: Protonix Take 1 tablet (  40 mg total) by mouth daily. Take 30-60 min before first meal of the day   SALONPAS PAIN RELIEF PATCH EX Apply 1 patch topically daily as needed (pain).   valsartan 160 MG tablet Commonly known as: DIOVAN TAKE 1 TABLET BY MOUTH EVERY DAY   valsartan-hydrochlorothiazide 320-12.5 MG tablet Commonly known as: DIOVAN-HCT Take 1 tablet by mouth daily.         REVIEW OF SYSTEMS: Pertinent positives and negatives discussed in HPI.   Objective:   Physical Exam: BP 138/76   Pulse 67   Temp 97.6 F (36.4 C)   Resp 16   Ht 5' 5.75" (1.67 m)   Wt 168 lb (76.2 kg)   SpO2 96%   BMI 27.32 kg/m  Body mass index is 27.32 kg/m. GEN: alert, well developed HEENT: clear conjunctiva, TM grey and translucent, nose with + inferior turbinate hypertrophy, pink nasal mucosa, slight clear rhinorrhea, no cobblestoning HEART: regular rate and rhythm, no murmur LUNGS: clear to auscultation bilaterally, no coughing, unlabored respiration ABDOMEN: soft, non distended  SKIN: no rashes or lesions  Reviewed:  07/13/2022: seen by Pulm Dr Melvyn Novas for chronic cough, possible GERD. attempted [facility] for PND without relief and referred to Allergy  05/11/2022: seen by Dr. Gasper Sells cardiology for CAD and mild AI/MR/TR.  Increased lasix dose  for cough and SOB.  Referred to Pulm for further evaluation.   Spirometry:  Tracings reviewed. Her effort: Good reproducible efforts. FVC: 1.91L FEV1: 1.62L, 129% predicted FEV1/FVC ratio: 85% Interpretation: Spirometry consistent with normal pattern.  Please see scanned spirometry results for details.  Skin Testing:  Skin prick testing was placed, which includes aeroallergens/foods, histamine control, and saline control.  Verbal consent was obtained prior to placing test.  Patient tolerated procedure well.  Allergy testing results were read and interpreted by myself, documented by clinical staff. Adequate positive and negative control.  Results discussed with patient/family.  Airborne Adult Perc - 09/05/22 1441     Time Antigen Placed 1441    Allergen Manufacturer Lavella Hammock    Location Back    Number of Test 59    1. Control-Buffer 50% Glycerol Negative    2. Control-Histamine 1 mg/ml 3+    3. Albumin saline Negative    4. [place] Negative    5. [place] Negative    6. Johnson Negative    7. Red Dog Mine Blue Negative    8. Meadow Fescue Negative    9. Perennial Rye Negative    10. Sweet Vernal Negative    11. Timothy Negative    12. Cocklebur Negative    13. Burweed Marshelder Negative    14. Ragweed, short Negative    15. Ragweed, Giant Negative    16. Plantain,  English Negative    17. Lamb's Quarters Negative    18. Sheep Sorrell Negative    19. Rough Pigweed Negative    20. Marsh Elder, Rough Negative    21. Mugwort, Common Negative    22. Ash mix Negative    23. Birch mix Negative    24. Beech American Negative    25. Box, Elder Negative    26. Cedar, red Negative    27. Cottonwood, [place] Negative    28. Elm mix Negative    29. Hickory Negative    30. Maple mix Negative    31. Oak, [place] mix Negative    32. Pecan Pollen Negative    33. Pine mix Negative    34. Sycamore Eastern Negative  [place]uet, Black Pollen Negative    36. Alternaria alternata Negative     37. Cladosporium Herbarum Negative    38. Aspergillus mix Negative    39. Penicillium mix 2+    40. Bipolaris sorokiniana (Helminthosporium) Negative    41. Drechslera spicifera (Curvularia) Negative    42. Mucor plumbeus Negative    43. Fusarium moniliforme Negative    44. Aureobasidium pullulans (pullulara) Negative    45. Rhizopus oryzae Negative    46. Botrytis cinera Negative    47. Epicoccum nigrum Negative    48. Phoma betae Negative    49. Candida Albicans Negative    50. Trichophyton mentagrophytes Negative    51. Mite, D Farinae  5,000 AU/ml Negative    52. Mite, D Pteronyssinus  5,000 AU/ml Negative    53. Cat Hair 10,000 BAU/ml Negative    54.  Dog Epithelia Negative    55. Mixed Feathers Negative    56. Horse Epithelia Negative    57. Cockroach, German Negative    58. Mouse Negative    59. Tobacco Leaf Negative               Assessment:   1. DOE (dyspnea on exertion)   2. Chronic cough   3. Nasal turbinate hypertrophy   4. Other allergic rhinitis     Plan/Recommendations:   Chronic Rhinitis - Positive skin test 08/2022: very mild reactivity to penicillin mold - No aeroallergen triggers found.  Likely non allergic rhinitis.  - Use nasal saline rinses before nose sprays such as with Neilmed Sinus Rinse.  Use distilled water.   - Use Flonase 1-2 sprays each nostril daily. Aim upward and outward. - Use Ipratroprium 2 sprays up to three times daily as needed for runny nose. Aim upward and outward.  Chronic Cough  Dyspnea on Exertion - Spirometry normal today, rules out obstructive lung disease. - DOE possibly related to deconditioning. Cough could be from post nasal drainage. - We will treat PND with the sprays discussed above but were unable to find any aeroallergen triggers. - Of note, HRCT did show fibrotic ILD similar to UIP.  Continue follow up with Pulm     Return in about 3 months (around 12/04/2022).  Harlon Flor, MD [facility] [place]

## 2022-09-05 NOTE — Telephone Encounter (Signed)
Returned call to [name].  They have been made aware that we have sent in a yearly prescription in November to [facility] and that they could call and have them transfer to them.  They was very thankful for the call back.

## 2022-09-09 ENCOUNTER — Ambulatory Visit (HOSPITAL_COMMUNITY)
Admission: RE | Admit: 2022-09-09 | Discharge: 2022-09-09 | Disposition: A | Discharge status: Home / Self Care | Payer: HMO | Source: Ambulatory Visit | Attending: Internal Medicine | Admitting: Internal Medicine

## 2022-09-09 DIAGNOSIS — M7989 Other specified soft tissue disorders: Secondary | ICD-10-CM | POA: Diagnosis not present

## 2022-09-09 DIAGNOSIS — E041 Nontoxic single thyroid nodule: Secondary | ICD-10-CM

## 2022-09-12 ENCOUNTER — Telehealth: Payer: Self-pay

## 2022-09-12 DIAGNOSIS — K862 Cyst of pancreas: Secondary | ICD-10-CM

## 2022-09-12 NOTE — Telephone Encounter (Signed)
[employer] is returning your call.

## 2022-09-12 NOTE — Telephone Encounter (Signed)
The pt has been advised and will await a call regarding MRCP - she will come into the office for labs as soon as able.     Left message on machine to call back  orders have been entered see below   "At this point our plan will be to repeat MRI/MRCP at a 19-monthinterval from her last EUS. If there is no dramatic change in the cyst itself, then we will plan a 666-monthollow-up and if things are stable at that point we will plan a 1 year surveillance thereafter. She will obtain a CA 19-9 at the time of her follow-up MRI/MRCP as well and we will make further discussions or decisions from there."

## 2022-09-12 NOTE — Progress Notes (Signed)
Called the pt and there was no answer- LMTCB.  

## 2022-09-12 NOTE — Telephone Encounter (Signed)
-----   Message from Algernon Huxley, RN sent at 05/12/2022 11:01 AM EDT ----- Regarding: MR/MRCP CA19-9 Pt needs MR/MRCP and CA 19-9 done at [facility] per Dr. Jannifer Rodney

## 2022-09-19 ENCOUNTER — Ambulatory Visit: Payer: Medicare HMO | Admitting: Internal Medicine

## 2022-09-20 ENCOUNTER — Telehealth: Payer: Self-pay | Admitting: Internal Medicine

## 2022-09-20 NOTE — Telephone Encounter (Signed)
Patient would like results of ultrasound. Patient phone number is [phone].

## 2022-09-20 NOTE — Telephone Encounter (Signed)
Patient has no further questions.  She was made aware of results and made aware of the plan for [place] to call her to reschedule [place] in one year.

## 2022-09-26 ENCOUNTER — Other Ambulatory Visit: Payer: Self-pay | Admitting: Internal Medicine

## 2022-09-26 ENCOUNTER — Other Ambulatory Visit (HOSPITAL_COMMUNITY)
Admission: RE | Admit: 2022-09-26 | Discharge: 2022-09-26 | Disposition: A | Discharge status: Home / Self Care | Payer: HMO | Source: Ambulatory Visit | Attending: Internal Medicine | Admitting: Internal Medicine

## 2022-09-26 DIAGNOSIS — K862 Cyst of pancreas: Secondary | ICD-10-CM | POA: Insufficient documentation

## 2022-09-29 DIAGNOSIS — Z299 Encounter for prophylactic measures, unspecified: Secondary | ICD-10-CM | POA: Diagnosis not present

## 2022-09-29 DIAGNOSIS — N390 Urinary tract infection, site not specified: Secondary | ICD-10-CM | POA: Diagnosis not present

## 2022-09-29 DIAGNOSIS — M549 Dorsalgia, unspecified: Secondary | ICD-10-CM | POA: Diagnosis not present

## 2022-09-29 DIAGNOSIS — M545 Low back pain, unspecified: Secondary | ICD-10-CM | POA: Diagnosis not present

## 2022-09-29 DIAGNOSIS — I10 Essential (primary) hypertension: Secondary | ICD-10-CM | POA: Diagnosis not present

## 2022-10-01 LAB — CANCER ANTIGEN 19-9: CA 19-9: <2 U/mL (ref 0–35)

## 2022-10-02 ENCOUNTER — Emergency Department (HOSPITAL_COMMUNITY)
Admission: EM | Admit: 2022-10-02 | Discharge: 2022-10-02 | Disposition: A | Discharge status: Home / Self Care | Payer: HMO | Attending: Emergency Medicine | Admitting: Emergency Medicine

## 2022-10-02 ENCOUNTER — Emergency Department (HOSPITAL_COMMUNITY): Payer: HMO

## 2022-10-02 ENCOUNTER — Other Ambulatory Visit: Payer: Self-pay

## 2022-10-02 ENCOUNTER — Encounter (HOSPITAL_COMMUNITY): Payer: Self-pay

## 2022-10-02 DIAGNOSIS — E119 Type 2 diabetes mellitus without complications: Secondary | ICD-10-CM | POA: Insufficient documentation

## 2022-10-02 DIAGNOSIS — Z7982 Long term (current) use of aspirin: Secondary | ICD-10-CM | POA: Diagnosis not present

## 2022-10-02 DIAGNOSIS — K769 Liver disease, unspecified: Secondary | ICD-10-CM

## 2022-10-02 DIAGNOSIS — M25552 Pain in left hip: Secondary | ICD-10-CM | POA: Diagnosis not present

## 2022-10-02 DIAGNOSIS — R109 Unspecified abdominal pain: Secondary | ICD-10-CM

## 2022-10-02 DIAGNOSIS — I10 Essential (primary) hypertension: Secondary | ICD-10-CM | POA: Diagnosis not present

## 2022-10-02 DIAGNOSIS — K869 Disease of pancreas, unspecified: Secondary | ICD-10-CM | POA: Insufficient documentation

## 2022-10-02 DIAGNOSIS — Z7984 Long term (current) use of oral hypoglycemic drugs: Secondary | ICD-10-CM | POA: Diagnosis not present

## 2022-10-02 DIAGNOSIS — I2510 Atherosclerotic heart disease of native coronary artery without angina pectoris: Secondary | ICD-10-CM | POA: Diagnosis not present

## 2022-10-02 DIAGNOSIS — M2560 Stiffness of unspecified joint, not elsewhere classified: Secondary | ICD-10-CM | POA: Diagnosis not present

## 2022-10-02 DIAGNOSIS — R030 Elevated blood-pressure reading, without diagnosis of hypertension: Secondary | ICD-10-CM | POA: Diagnosis not present

## 2022-10-02 DIAGNOSIS — Z8673 Personal history of transient ischemic attack (TIA), and cerebral infarction without residual deficits: Secondary | ICD-10-CM | POA: Diagnosis not present

## 2022-10-02 DIAGNOSIS — I700 Atherosclerosis of aorta: Secondary | ICD-10-CM | POA: Diagnosis not present

## 2022-10-02 DIAGNOSIS — Z79899 Other long term (current) drug therapy: Secondary | ICD-10-CM | POA: Insufficient documentation

## 2022-10-02 DIAGNOSIS — M436 Torticollis: Secondary | ICD-10-CM

## 2022-10-02 DIAGNOSIS — R509 Fever, unspecified: Secondary | ICD-10-CM | POA: Diagnosis not present

## 2022-10-02 DIAGNOSIS — K7689 Other specified diseases of liver: Secondary | ICD-10-CM | POA: Diagnosis not present

## 2022-10-02 DIAGNOSIS — Z6826 Body mass index (BMI) 26.0-26.9, adult: Secondary | ICD-10-CM | POA: Diagnosis not present

## 2022-10-02 DIAGNOSIS — M542 Cervicalgia: Secondary | ICD-10-CM | POA: Diagnosis not present

## 2022-10-02 DIAGNOSIS — K8689 Other specified diseases of pancreas: Secondary | ICD-10-CM

## 2022-10-02 DIAGNOSIS — R1032 Left lower quadrant pain: Secondary | ICD-10-CM | POA: Diagnosis not present

## 2022-10-02 DIAGNOSIS — E663 Overweight: Secondary | ICD-10-CM | POA: Diagnosis not present

## 2022-10-02 LAB — COMPREHENSIVE METABOLIC PANEL
ALT: 29 U/L (ref 0–44)
AST: 46 U/L — ABNORMAL HIGH (ref 15–41)
Albumin: 3.9 g/dL (ref 3.5–5.0)
Alkaline Phosphatase: 195 U/L — ABNORMAL HIGH (ref 38–126)
Anion gap: 10 (ref 5–15)
BUN: 46 mg/dL — ABNORMAL HIGH (ref 8–23)
CO2: 26 mmol/L (ref 22–32)
Calcium: 9.1 mg/dL (ref 8.9–10.3)
Chloride: 98 mmol/L (ref 98–111)
Creatinine, Ser: 1.74 mg/dL — ABNORMAL HIGH (ref 0.44–1.00)
GFR, Estimated: 28 mL/min — ABNORMAL LOW (ref >60)
Glucose, Bld: 270 mg/dL — ABNORMAL HIGH (ref 70–99)
Potassium: 4.1 mmol/L (ref 3.5–5.1)
Sodium: 134 mmol/L — ABNORMAL LOW (ref 135–145)
Total Bilirubin: 1.5 mg/dL — ABNORMAL HIGH (ref 0.3–1.2)
Total Protein: 7.5 g/dL (ref 6.5–8.1)

## 2022-10-02 LAB — CBC WITH DIFFERENTIAL/PLATELET
Abs Immature Granulocytes: 0.03 10*3/uL (ref 0.00–0.07)
Basophils Absolute: 0 10*3/uL (ref 0.0–0.1)
Basophils Relative: 1 %
Eosinophils Absolute: 0 10*3/uL (ref 0.0–0.5)
Eosinophils Relative: 0 %
HCT: 37.6 % (ref 36.0–46.0)
Hemoglobin: 12.5 g/dL (ref 12.0–15.0)
Immature Granulocytes: 0 %
Lymphocytes Relative: 4 %
Lymphs Abs: 0.4 10*3/uL — ABNORMAL LOW (ref 0.7–4.0)
MCH: 31.3 pg (ref 26.0–34.0)
MCHC: 33.2 g/dL (ref 30.0–36.0)
MCV: 94.2 fL (ref 80.0–100.0)
Monocytes Absolute: 0.8 10*3/uL (ref 0.1–1.0)
Monocytes Relative: 9 %
Neutro Abs: 7.5 10*3/uL (ref 1.7–7.7)
Neutrophils Relative %: 86 %
Platelets: 136 10*3/uL — ABNORMAL LOW (ref 150–400)
RBC: 3.99 MIL/uL (ref 3.87–5.11)
RDW: 12.4 % (ref 11.5–15.5)
WBC: 8.7 10*3/uL (ref 4.0–10.5)
nRBC: 0 % (ref 0.0–0.2)

## 2022-10-02 LAB — URINALYSIS, ROUTINE W REFLEX MICROSCOPIC
Bilirubin Urine: NEGATIVE
Glucose, UA: NEGATIVE mg/dL
Hgb urine dipstick: NEGATIVE
Ketones, ur: NEGATIVE mg/dL
Leukocytes,Ua: NEGATIVE
Nitrite: NEGATIVE
Protein, ur: NEGATIVE mg/dL
Specific Gravity, Urine: 1.01 (ref 1.005–1.030)
pH: 5 (ref 5.0–8.0)

## 2022-10-02 LAB — LIPASE, BLOOD: Lipase: 27 U/L (ref 11–51)

## 2022-10-02 MED ORDER — ONDANSETRON HCL 4 MG/2ML IJ SOLN
4.0000 mg | Freq: Once | INTRAMUSCULAR | Status: AC
  Administered 2022-10-02: 4 mg via INTRAVENOUS
  Filled 2022-10-02: qty 2

## 2022-10-02 MED ORDER — HYDROMORPHONE HCL 1 MG/ML IJ SOLN
1.0000 mg | Freq: Once | INTRAMUSCULAR | Status: AC
  Administered 2022-10-02: 1 mg via INTRAVENOUS
  Filled 2022-10-02: qty 1

## 2022-10-02 MED ORDER — HYDROCODONE-ACETAMINOPHEN 5-325 MG PO TABS: 1.0000 | ORAL_TABLET | Freq: Four times a day (QID) | ORAL | 0 refills | Status: DC | PRN

## 2022-10-02 MED ORDER — SODIUM CHLORIDE 0.9 % IV SOLN: INTRAVENOUS | Status: DC

## 2022-10-02 NOTE — ED Triage Notes (Signed)
Pt presents with back pain that started Friday and neck pain that started yesterday. Denies injury. Pt reports seeing her PCP  on 3/21 and dx with a kidney infection.

## 2022-10-02 NOTE — ED Provider Notes (Signed)
[facility] Provider Note   CSN: [id number] Arrival date & time: 10/02/22  1523     History  Chief Complaint  Patient presents with   Back Pain   Neck Pain    [name] is a 88 y.o. female.  Patient with a complaint of left flank pain left lower quadrant pain and left back pain.  That started on Thursday.  On Saturday morning patient woke up and had pain to the left lateral aspect of the neck.  Now pain to both sides of the neck.  Patient was seen on Thursday by PCP and was treated with an antibiotic which she does not know which 1 for kidney infection.  Patient denies fever patient denies nausea vomiting or diarrhea.  Patient difficult historian but son was helpful in sorting things out.  Past medical history sniffing for hypertension TIAs coronary artery disease and diabetes.  Past surgical history significant for abdominal hysterectomy.  Patient is never smoked tobacco products.       Home Medications Prior to Admission medications   Medication Sig Start Date End Date Taking? Authorizing Provider  acetaminophen (TYLENOL) 650 MG CR tablet Take 650 mg by mouth 3 (three) times daily.   Yes [provider]  aspirin EC 81 MG tablet Take 81 mg by mouth daily. Swallow whole.   Yes [provider]  atorvastatin (LIPITOR) 80 MG tablet Take 80 mg by mouth daily at 6 PM. 08/17/21  Yes [provider]  cefUROXime (CEFTIN) 250 MG tablet Take 250 mg by mouth 2 (two) times daily. 09/29/22  Yes [provider]  ciclopirox (PENLAC) 8 % solution Apply 1 Application topically at bedtime. Apply over nail and surrounding skin. Apply daily over previous coat. After seven (7) days, may remove with alcohol and continue cycle.   Yes [provider]  Dextromethorphan-guaiFENesin (MUCINEX DM) 30-600 MG TB12 Take 1 tablet by mouth 2 (two) times daily. 08/18/22  Yes [provider]  diclofenac Sodium  (VOLTAREN) 1 % GEL Apply 1 Application topically 4 (four) times daily as needed for pain. 02/24/22  Yes [provider]  docusate sodium (COLACE) 50 MG capsule Take 50 mg by mouth daily as needed for mild constipation.   Yes [provider]  famotidine (PEPCID) 20 MG tablet One after supper 01/13/22  Yes Tanda Rockers, MD  fluticasone (FLONASE) 50 MCG/ACT nasal spray Place 1-2 sprays each nostril daily. Aim upward and outward. 09/05/22  Yes Larose Kells, MD  furosemide (LASIX) 40 MG tablet Take 1 tablet (40 mg total) by mouth daily. 05/11/22  Yes Chandrasekhar, Mahesh A, MD  gabapentin (NEURONTIN) 100 MG capsule TAKE ONE CAPSULE BY MOUTH THREE TIMES DAILY 09/28/22  Yes Tanda Rockers, MD  glipiZIDE (GLUCOTROL XL) 5 MG 24 hr tablet Take 5 mg by mouth 2 (two) times daily. 10/08/14  Yes [provider]  HYDROcodone-acetaminophen (NORCO/VICODIN) 5-325 MG tablet Take 1 tablet by mouth every 6 (six) hours as needed. 10/02/22  Yes Fredia Sorrow, MD  ipratropium (ATROVENT) 0.03 % nasal spray Place 2 sprays in each nostril up to three times daily as needed for runny nose 09/05/22  Yes Larose Kells, MD  latanoprost (XALATAN) 0.005 % ophthalmic solution Place 1 drop into both eyes at bedtime. 09/13/21  Yes [provider]  nitroGLYCERIN (NITROSTAT) 0.4 MG SL tablet Place 1 tablet (0.4 mg total) under the tongue every 5 (five) minutes as needed. 09/08/21  Yes  Chandrasekhar, Mahesh A, MD  valsartan (DIOVAN) 160 MG tablet TAKE 1 TABLET BY MOUTH EVERY DAY 08/26/22  Yes Chandrasekhar, Mahesh A, MD  metoprolol succinate (TOPROL-XL) 25 MG 24 hr tablet Take 25 mg by mouth daily. Patient not taking: Reported on 09/05/2022    [provider]      Allergies    Other, Codeine, and Milk-related compounds    Review of Systems   Review of Systems  Constitutional:  Negative for chills and fever.  HENT:  Negative for ear pain and sore throat.   Eyes:  Negative for pain and visual  disturbance.  Respiratory:  Negative for cough and shortness of breath.   Cardiovascular:  Negative for chest pain and palpitations.  Gastrointestinal:  Positive for abdominal pain. Negative for vomiting.  Genitourinary:  Positive for flank pain. Negative for dysuria and hematuria.  Musculoskeletal:  Positive for back pain and neck pain. Negative for arthralgias.  Skin:  Negative for color change and rash.  Neurological:  Negative for seizures and syncope.  All other systems reviewed and are negative.   Physical Exam Updated Vital Signs BP (!) 146/75   Pulse 66   Temp 98.4 F (36.9 C)   Resp 16   Ht 1.676 m (5\' 6" )   Wt 74.8 kg   SpO2 92%   BMI 26.63 kg/m  Physical Exam Vitals and nursing note reviewed.  Constitutional:      General: She is not in acute distress.    Appearance: Normal appearance. She is well-developed. She is not ill-appearing.  HENT:     Head: Normocephalic and atraumatic.     Mouth/Throat:     Mouth: Mucous membranes are moist.  Eyes:     Extraocular Movements: Extraocular movements intact.     Conjunctiva/sclera: Conjunctivae normal.     Pupils: Pupils are equal, round, and reactive to light.  Cardiovascular:     Rate and Rhythm: Normal rate and regular rhythm.     Heart sounds: No murmur heard. Pulmonary:     Effort: Pulmonary effort is normal. No respiratory distress.     Breath sounds: Normal breath sounds.  Abdominal:     Palpations: Abdomen is soft.     Tenderness: There is no abdominal tenderness.  Musculoskeletal:        General: No swelling.     Cervical back: Normal range of motion and neck supple. No rigidity.  Skin:    General: Skin is warm and dry.     Capillary Refill: Capillary refill takes less than 2 seconds.  Neurological:     General: No focal deficit present.     Mental Status: She is alert and oriented to person, place, and time.     Cranial Nerves: No cranial nerve deficit.     Sensory: No sensory deficit.     Motor: No  weakness.  Psychiatric:        Mood and Affect: Mood normal.     ED Results / Procedures / Treatments   Labs (all labs ordered are listed, but only abnormal results are displayed) Labs Reviewed  CBC WITH DIFFERENTIAL/PLATELET - Abnormal; Notable for the following components:      Result Value   Platelets 136 (*)    Lymphs Abs 0.4 (*)    All other components within normal limits  COMPREHENSIVE METABOLIC PANEL - Abnormal; Notable for the following components:   Sodium 134 (*)    Glucose, Bld 270 (*)    BUN 46 (*)  Creatinine, Ser 1.74 (*)    AST 46 (*)    Alkaline Phosphatase 195 (*)    Total Bilirubin 1.5 (*)    GFR, Estimated 28 (*)    All other components within normal limits  LIPASE, BLOOD  URINALYSIS, ROUTINE W REFLEX MICROSCOPIC    EKG None  Radiology CT Cervical Spine Wo Contrast  Result Date: 10/02/2022 CLINICAL DATA:  Neck pain EXAM: CT CERVICAL SPINE WITHOUT CONTRAST TECHNIQUE: Multidetector CT imaging of the cervical spine was performed without intravenous contrast. Multiplanar CT image reconstructions were also generated. RADIATION DOSE REDUCTION: This exam was performed according to the departmental dose-optimization program which includes automated exposure control, adjustment of the mA and/or kV according to patient size and/or use of iterative reconstruction technique. COMPARISON:  None Available. FINDINGS: Alignment: Normal. Skull base and vertebrae: No acute fracture. No primary bone lesion or focal pathologic process. Soft tissues and spinal canal: No prevertebral fluid or swelling. No visible canal hematoma. Disc levels: Disc space narrowing and marginal osteophyte formation identified throughout the cervical spine. Interfacetal facet joint degenerative changes with sclerosis and osteophytes from C2-3 through C7-T1 Upper chest: Linear interstitial changes likely scarring Other: None. IMPRESSION: Degenerative changes. No acute osseous abnormalities are  identified. Electronically Signed   By: Sammie Bench M.D.   On: 10/02/2022 18:02   CT ABDOMEN PELVIS WO CONTRAST  Result Date: 10/02/2022 CLINICAL DATA:  Left lower quadrant and back pain for several days. EXAM: CT ABDOMEN AND PELVIS WITHOUT CONTRAST TECHNIQUE: Multidetector CT imaging of the abdomen and pelvis was performed following the standard protocol without IV contrast. RADIATION DOSE REDUCTION: This exam was performed according to the departmental dose-optimization program which includes automated exposure control, adjustment of the mA and/or kV according to patient size and/or use of iterative reconstruction technique. COMPARISON:  None Available. FINDINGS: Lower chest: No acute findings. Hepatobiliary: 2.9 cm low-attenuation lesion in the left hepatic lobe and 1.1 cm low-attenuation lesion in the right hepatic lobe have higher than fluid attenuation and cannot be characterized on this unenhanced exam. Gallbladder is unremarkable. No evidence of biliary ductal dilatation. Pancreas: Fatty replacement of the pancreas is seen. A bilobed lesion is seen in the region of the pancreatic body measuring 3.2 x 2.3 cm on image 20/2. This is suspicious for a cystic pancreatic mass cannot be characterized on this unenhanced exam. No signs of acute pancreatitis are seen. Spleen:  Within normal limits in size. Adrenals/Urinary tract: No evidence of urolithiasis or hydronephrosis. Unremarkable unopacified urinary bladder. Stomach/Bowel: No evidence of obstruction, inflammatory process, or abnormal fluid collections. Diverticulosis is seen mainly involving the sigmoid colon, however there is no evidence of diverticulitis. Vascular/Lymphatic: No pathologically enlarged lymph nodes identified. No evidence of abdominal aortic aneurysm. Aortic atherosclerotic calcification incidentally noted. Reproductive: Prior hysterectomy noted. Adnexal regions are unremarkable in appearance. Other:  None. Musculoskeletal:  No  suspicious bone lesions identified. IMPRESSION: Colonic diverticulosis, without radiographic evidence of diverticulitis or other acute findings. 3.2 cm lesion in the region of the pancreatic body, suspicious for cystic pancreatic mass. Nonemergent outpatient abdomen MRI without and with contrast is recommended for further characterization. Two indeterminate low-attenuation liver lesions, which cannot be characterized on this noncontrast exam. These can also be better evaluated on abdomen MRI without and with contrast. Aortic Atherosclerosis (ICD10-I70.0). Electronically Signed   By: Marlaine Hind M.D.   On: 10/02/2022 17:51    Procedures Procedures    Medications Ordered in ED Medications  0.9 %  sodium chloride infusion (0 mLs Intravenous  Stopped 10/02/22 2044)  ondansetron (ZOFRAN) injection 4 mg (4 mg Intravenous Given 10/02/22 1635)  HYDROmorphone (DILAUDID) injection 1 mg (1 mg Intravenous Given 10/02/22 1635)    ED Course/ Medical Decision Making/ A&P                             Medical Decision Making Amount and/or Complexity of Data Reviewed Labs: ordered. Radiology: ordered.  Risk Prescription drug management.   Patient's urinalysis here was took a while to come to obtain was negative no signs urinary tract infection.  Will have her continue to take her antibiotics.  CBC no leukocytosis hemoglobin good platelets are down a little bit at [id number] complete metabolic panel is significant for a GFR 28 that is not significantly changed from past total bili is 1.5 alk phos 195 AST 46 ALT 29 patient's sodium 134 potassium is good blood sugar 270.  CO2 26 lipase normal.  CT scan of the neck without any acute findings.  CT abdomen and pelvis with incidental findings and this had come up in the past about a pancreatic mass and some liver lesions.  Patient was told about this back in the fall.  Has not seen gastroenterology.  I discussed this with her and her son.  Will refer her to  gastroenterology.  Feel that symptoms were mostly musculoskeletal.  Patient received hydromorphone here.  The neck discomfort which seem to be consistent with a lateral left lateral torticollis.  Improved significantly and the pain that she had on the left flank seem to be mostly musculoskeletal because it was worse with not movement of the leg but worse with movement of the back.  Patient will be discharged home on a prepack of codon.  And follow-up with gastroenterology.  She will continue her antibiotics.  Clinically no concerns for meningitis.   Final Clinical Impression(s) / ED Diagnoses Final diagnoses:  Acute left flank pain  Pancreatic mass  Liver lesion  Torticollis    Rx / DC Orders ED Discharge Orders          Ordered    HYDROcodone-acetaminophen (NORCO/VICODIN) 5-325 MG tablet  Every 6 hours PRN        10/02/22 2138              Fredia Sorrow, MD 10/02/22 2153

## 2022-10-02 NOTE — Discharge Instructions (Signed)
Take the hydrocodone as needed for pain.  Regarding the liver lesions and the pancreatic mass give Dr. Gala Romney from gastroenterology a call for follow-up.  Return for any new or worse symptoms.  Continue to take your antibiotic that you are taking for urinary tract infection however urine is normal here today.

## 2022-10-02 NOTE — ED Notes (Signed)
Pt ambulated to the BR with her cane and stand by assistance.

## 2022-10-03 ENCOUNTER — Telehealth (HOSPITAL_COMMUNITY): Payer: Self-pay

## 2022-10-03 DIAGNOSIS — K8689 Other specified diseases of pancreas: Secondary | ICD-10-CM | POA: Diagnosis not present

## 2022-10-03 DIAGNOSIS — Z299 Encounter for prophylactic measures, unspecified: Secondary | ICD-10-CM | POA: Diagnosis not present

## 2022-10-03 DIAGNOSIS — M545 Low back pain, unspecified: Secondary | ICD-10-CM | POA: Diagnosis not present

## 2022-10-03 DIAGNOSIS — M542 Cervicalgia: Secondary | ICD-10-CM | POA: Diagnosis not present

## 2022-10-04 MED FILL — Hydrocodone-Acetaminophen Tab 5-325 MG: ORAL | Qty: 6 | Status: AC

## 2022-10-05 ENCOUNTER — Ambulatory Visit: Payer: Medicare HMO | Admitting: Internal Medicine

## 2022-10-08 DIAGNOSIS — E1165 Type 2 diabetes mellitus with hyperglycemia: Secondary | ICD-10-CM | POA: Diagnosis not present

## 2022-10-08 DIAGNOSIS — I10 Essential (primary) hypertension: Secondary | ICD-10-CM | POA: Diagnosis not present

## 2022-10-11 ENCOUNTER — Encounter: Payer: Self-pay | Admitting: Gastroenterology

## 2022-10-11 NOTE — Progress Notes (Unsigned)
Referring Provider: Glenda Chroman, MD Primary Care Physician:  Glenda Chroman, MD Primary GI Physician: Dr. Abbey Chatters  No chief complaint on file.   HPI:   [name] is a 88 y.o. female with history of CAD, NSTEMI January 2023, T2DM, HTN, TIA, pancreatic cyst, liver lesion cyst, presenting today for follow-up of GERD, dysphagia ***,    Last seen in our office 01/13/2022 for further evaluation of pancreatic cyst, liver lesion, constipation, dysphagia.  Pancreatic cyst, the CT of her chest in June 2023 that shows enlarging cystic pancreatic lesion measuring greater than 3 cm ***.  Regarding liver lesion, CT chest also noted well-circumscribed low-density lesion in the left hepatic lobe favored to be a cyst versus hemangioma.  Reported chronic constipation not taking anything for bowel regularity.  Also with new onset solid food dysphagia x 2-3 weeks. Recommended EGD for dysphagia, Colace 100-200 mg daily.  Reviewed case with Dr. Rush Landmark who recommended MRI/MRCP to evaluate pancreatic lesion  MRI/MRCP with and without contrast 01/25/2022 with bilobed cystic lesion in the body of pancreas measuring up to 3.3 cm, low risk imaging features.  2 hepatic cysts including hemorrhagic/proteinaceous cyst in the left lobe.  Patient underwent EGD and EUS 03/31/2022 with Dr. Rush Landmark.  EGD impression:  - No gross mucosal lesions in esophagus. Biopsied. Dilated (path- mildly active squamous mucosa) - Tortuous esophagus.  - Z- line regular, 35 cm from the incisors.  - Gastritis. Biopsied. (Path- minimal chronic gastritis, no H pylori) - No gross lesions in the duodenal bulb, in the first portion of the duodenum and in the second portion of the duodenum.  - Normal major papilla.  EUS impression: - A cystic lesion was seen in the pancreatic body region ( more exophytic) . Fine needle aspiration for fluid performed and sent for CEA/ amylase/ glucose/ KRAS & GNAS ( pending CEA greater than 192) .   - Pancreatic parenchymal abnormalities were noted in the pancreatic head, genu of the pancreas, pancreatic body and pancreatic tail.  - There was no sign of significant pathology in the common bile duct.  - A cyst was found in the visualized portion of the liver and measured 26 mm by 23 mm. This has been noted previously as a proteinaceous cyst. It was not aspirated.  - No malignant- appearing lymph nodes were visualized in the celiac region ( level 20) , perigastric region and peripancreatic region.. - Path- atypical cells present, atypical papillary mucinous epithelium. CEA- [phone] - Final [facility] Pancreagen fluid analysis studies   Cytology = hypocellular DNA quantity/quality = Low quantity/poor quality-degraded Oncogene point mutations = GNAS no mutation detected; KRAS high clonality G12V Tumor suppressor genes = no loss of heterozygosity   Statistically higher risk cyst is noted based on this final testing.  CA 19-9 was normal in October 2023.  Case is also discussed at [facility]. Consensus from the group was that in most instances, this particular cyst with findings on cytology would be recommended for consideration of intervention due to high risk nature and mucinous cystic neoplasm setting. With this being said, at her current age, her comorbidities, her asymptomatic nature, it would be reasonable to consider surveillance.  Patient stated surgery was not something she wanted to pursue and preferred monitoring.  Recommended MRI/MRCP at 35-month interval from last EUS.  If no dramatic change, then 64-month follow-up, and if stable, then 1 year surveillance thereafter.  She is currently scheduled for MRI/MRCP 10/13/2022. She is supposed to have repeat CA  19-9 as well.   Today:   GERD:  Reports an after taste after eating and drinking. No abdominal pain, nausea, vomiting, brbpr, melena.   Dysphagia:  Only feels foods get hung when eating quickly.  More with meats.    Pancreatic lesion: MRI  tomorrow.      Past Medical History:  Diagnosis Date   CAD (coronary artery disease)    a. s/p NSTEMI in 07/2021 with cath showing minimal CAD and no culprit lesion identified   Cramps, extremity    legs and hands   Diabetes    type II   Enterococcus UTI 02/03/2016   H/O cardiovascular stress test 03/05/2009   normal, no ischemia   H/O Doppler ultrasound    lower ext arterial doppler 04/13/09-no evidence of arterial insufficiency, normal values; distal aorta 2.4x2.7   HTN (hypertension)    Murmur, cardiac    echo 02/25/13- EF 60-65%, impaired relaxation-grade 1 diastolic dysfunction, mild concentric lvh, mild to moderate aortic regurgitation   Myocardial infarction    january   Pneumonia    May 2017   Postoperative anemia due to acute blood loss 02/02/2016   Primary localized osteoarthritis of right knee    Recurrent upper respiratory infection (URI)    TIA (transient ischemic attack)    carotid doppler 05/08/11-normal patency    Past Surgical History:  Procedure Laterality Date   ABDOMINAL HYSTERECTOMY     BIOPSY  03/31/2022   Procedure: BIOPSY;  Surgeon: Irving Copas., MD;  Location: [facility];  Service: Gastroenterology;;   BUNIONECTOMY Left    COLONOSCOPY  04/29/2010   Dr. Cristina Gong; diverticulosis in the entire colon, otherwise normal exam. Recommended repeat colonoscopy in 5 years for surveillance due to personal history of polyps.   ESOPHAGOGASTRODUODENOSCOPY (EGD) WITH PROPOFOL N/A 03/31/2022   Procedure: ESOPHAGOGASTRODUODENOSCOPY (EGD) WITH PROPOFOL;  Surgeon: Rush Landmark Telford Nab., MD;  Location: [facility];  Service: Gastroenterology;  Laterality: N/A;   EUS N/A 03/31/2022   Procedure: UPPER ENDOSCOPIC ULTRASOUND (EUS) LINEAR;  Surgeon: Irving Copas., MD;  Location: [facility];  Service: Gastroenterology;  Laterality: N/A;   FINE NEEDLE ASPIRATION  03/31/2022   Procedure: FINE NEEDLE ASPIRATION (FNA) LINEAR;  Surgeon: Irving Copas., MD;  Location: [facility];  Service: Gastroenterology;;   Azzie Almas DILATION N/A 03/31/2022   Procedure: Azzie Almas DILATION;  Surgeon: Irving Copas., MD;  Location: [facility];  Service: Gastroenterology;  Laterality: N/A;   TOTAL KNEE ARTHROPLASTY Right 02/01/2016   Procedure: TOTAL KNEE ARTHROPLASTY;  Surgeon: Elsie Saas, MD;  Location: [facility];  Service: Orthopedics;  Laterality: Right;    Current Outpatient Medications  Medication Sig Dispense Refill   aspirin EC 81 MG tablet Take 81 mg by mouth daily. Swallow whole.     atorvastatin (LIPITOR) 80 MG tablet Take 80 mg by mouth daily at 6 PM.     cefUROXime (CEFTIN) 250 MG tablet Take 250 mg by mouth 2 (two) times daily.     ciclopirox (PENLAC) 8 % solution Apply 1 Application topically at bedtime. Apply over nail and surrounding skin. Apply daily over previous coat. After seven (7) days, may remove with alcohol and continue cycle.     diclofenac Sodium (VOLTAREN) 1 % GEL Apply 1 Application topically 4 (four) times daily as needed for pain.     famotidine (PEPCID) 20 MG tablet One after supper 30 tablet 11   fluticasone (FLONASE) 50 MCG/ACT nasal spray Place 1-2 sprays each nostril daily. Aim upward and outward. 16 g  5   furosemide (LASIX) 40 MG tablet Take 1 tablet (40 mg total) by mouth daily. 90 tablet 3   gabapentin (NEURONTIN) 100 MG capsule TAKE ONE CAPSULE BY MOUTH THREE TIMES DAILY 90 capsule 2   glipiZIDE (GLUCOTROL XL) 5 MG 24 hr tablet Take 5 mg by mouth 2 (two) times daily.     ipratropium (ATROVENT) 0.03 % nasal spray Place 2 sprays in each nostril up to three times daily as needed for runny nose 30 mL 5   latanoprost (XALATAN) 0.005 % ophthalmic solution Place 1 drop into both eyes at bedtime.     nitroGLYCERIN (NITROSTAT) 0.4 MG SL tablet Place 1 tablet (0.4 mg total) under the tongue every 5 (five) minutes as needed. 25 tablet 3   piroxicam (FELDENE) 10 MG capsule Take 10 mg by mouth daily.      valsartan (DIOVAN) 160 MG tablet TAKE 1 TABLET BY MOUTH EVERY DAY 90 tablet 3   acetaminophen (TYLENOL) 650 MG CR tablet Take 650 mg by mouth 3 (three) times daily.     Dextromethorphan-guaiFENesin (MUCINEX DM) 30-600 MG TB12 Take 1 tablet by mouth 2 (two) times daily. (Patient not taking: Reported on 10/12/2022)     docusate sodium (COLACE) 50 MG capsule Take 50 mg by mouth daily as needed for mild constipation. (Patient not taking: Reported on 10/12/2022)     HYDROcodone-acetaminophen (NORCO/VICODIN) 5-325 MG tablet Take 1 tablet by mouth every 6 (six) hours as needed. (Patient not taking: Reported on 10/12/2022) 6 tablet 0   metoprolol succinate (TOPROL-XL) 25 MG 24 hr tablet Take 25 mg by mouth daily. (Patient not taking: Reported on 09/05/2022)     No current facility-administered medications for this visit.    Allergies as of 10/12/2022 - Review Complete 10/12/2022  Allergen Reaction Noted   Other Itching 03/28/2022   Codeine Nausea Only 10/17/2014   Milk-related compounds Diarrhea 11/03/2015    Family History  Problem Relation Age of Onset   Stroke Mother    Cancer Father    Lung cancer Father    Cancer Sister    Diabetes Brother    Diabetes Brother    Diabetes Brother    Pancreatic cancer Neg Hx    Allergic rhinitis Neg Hx    Angioedema Neg Hx    Asthma Neg Hx    Eczema Neg Hx     Social History   Socioeconomic History   Marital status: Widowed    Spouse name: Not on file   Number of children: 2   Years of education: Not on file   Highest education level: Not on file  Occupational History   Not on file  Tobacco Use   Smoking status: Never   Smokeless tobacco: Never  Vaping Use   Vaping Use: Never used  Substance and Sexual Activity   Alcohol use: No    Alcohol/week: 0.0 standard drinks of alcohol   Drug use: No   Sexual activity: Not on file  Other Topics Concern   Not on file  Social History Narrative   Step son lives with her.  She is a widow.   Social  Determinants of Health   Financial Resource Strain: Not on file  Food Insecurity: Not on file  Transportation Needs: Not on file  Physical Activity: Not on file  Stress: Not on file  Social Connections: Not on file    Review of Systems: Gen: Denies fever, chills, anorexia. Denies fatigue, weakness, weight loss.  CV: Denies chest pain,  palpitations, syncope, peripheral edema, and claudication. Resp: Denies dyspnea at rest, cough, wheezing, coughing up blood, and pleurisy. GI: Denies vomiting blood, jaundice, and fecal incontinence.   Denies dysphagia or odynophagia. Derm: Denies rash, itching, dry skin Psych: Denies depression, anxiety, memory loss, confusion. No homicidal or suicidal ideation.  Heme: Denies bruising, bleeding, and enlarged lymph nodes.  Physical Exam: BP 128/81 (BP Location: Right Arm, Patient Position: Sitting, Cuff Size: Normal)   Pulse 75   Temp 97.6 F (36.4 C) (Temporal)   Ht 5\' 3"  (1.6 m)   Wt 165 lb 3.2 oz (74.9 kg)   SpO2 96%   BMI 29.26 kg/m  General:   Alert and oriented. No distress noted. Pleasant and cooperative.  Head:  Normocephalic and atraumatic. Eyes:  Conjuctiva clear without scleral icterus. Heart:  S1, S2 present without murmurs appreciated. Lungs:  Clear to auscultation bilaterally. No wheezes, rales, or rhonchi. No distress.  Abdomen:  +BS, soft, non-tender and non-distended. No rebound or guarding. No HSM or masses noted. Msk:  Symmetrical without gross deformities. Normal posture. Extremities:  Without edema. Neurologic:  Alert and  oriented x4 Psych:  Normal mood and affect.    Assessment:     Plan:  ***   Aliene Altes, PA-C [facility] 10/12/2022

## 2022-10-12 ENCOUNTER — Encounter: Payer: Self-pay | Admitting: Gastroenterology

## 2022-10-12 ENCOUNTER — Ambulatory Visit (INDEPENDENT_AMBULATORY_CARE_PROVIDER_SITE_OTHER): Payer: HMO | Admitting: Gastroenterology

## 2022-10-12 VITALS — BP 128/81 | HR 75 | Temp 97.6°F | Ht 63.0 in | Wt 165.2 lb

## 2022-10-12 DIAGNOSIS — K869 Disease of pancreas, unspecified: Secondary | ICD-10-CM

## 2022-10-12 DIAGNOSIS — K5909 Other constipation: Secondary | ICD-10-CM

## 2022-10-12 DIAGNOSIS — K219 Gastro-esophageal reflux disease without esophagitis: Secondary | ICD-10-CM

## 2022-10-12 DIAGNOSIS — R1310 Dysphagia, unspecified: Secondary | ICD-10-CM

## 2022-10-12 MED ORDER — PANTOPRAZOLE SODIUM 40 MG PO TBEC: 40.0000 mg | DELAYED_RELEASE_TABLET | Freq: Every day | ORAL | 3 refills | Status: DC

## 2022-10-12 NOTE — Patient Instructions (Signed)
You are scheduled for an MRI tomorrow at 10 AM at [facility] to follow-up on your pancreatic lesion. If you need to talk to someone about the timing of your MRI, went to be there, etc., you can call central scheduling at [phone].  For reflux, stop famotidine and start pantoprazole 40 mg daily.  Follow a GERD diet/lifestyle:  Avoid fried, fatty, greasy, spicy, citrus foods. Avoid caffeine and carbonated beverages. Avoid chocolate. Try eating 4-6 small meals a day rather than 3 large meals. Do not eat within 3 hours of laying down. Prop head of bed up on wood or bricks to create a 6 inch incline.  For constipation: Start Colace (docusate sodium) 100-200 mg daily. Try to drink at least 64 ounces of water daily. Consume plenty fruits, vegetables, whole grains to maintain adequate fiber intake.  Will plan to follow-up with you in 3 months or sooner if needed.  It was good to see you again today!  Aliene Altes, PA-C [facility]

## 2022-10-13 ENCOUNTER — Other Ambulatory Visit: Payer: Self-pay | Admitting: Gastroenterology

## 2022-10-13 ENCOUNTER — Encounter: Payer: Self-pay | Admitting: Gastroenterology

## 2022-10-13 ENCOUNTER — Ambulatory Visit (HOSPITAL_COMMUNITY)
Admission: RE | Admit: 2022-10-13 | Discharge: 2022-10-13 | Disposition: A | Discharge status: Home / Self Care | Payer: HMO | Source: Ambulatory Visit | Attending: Gastroenterology | Admitting: Gastroenterology

## 2022-10-13 DIAGNOSIS — K862 Cyst of pancreas: Secondary | ICD-10-CM | POA: Diagnosis not present

## 2022-10-13 DIAGNOSIS — R935 Abnormal findings on diagnostic imaging of other abdominal regions, including retroperitoneum: Secondary | ICD-10-CM | POA: Diagnosis not present

## 2022-10-13 DIAGNOSIS — K8020 Calculus of gallbladder without cholecystitis without obstruction: Secondary | ICD-10-CM | POA: Diagnosis not present

## 2022-10-13 MED ORDER — GADOBUTROL 1 MMOL/ML IV SOLN
5.0000 mL | Freq: Once | INTRAVENOUS | Status: AC | PRN
  Administered 2022-10-13: 5 mL via INTRAVENOUS

## 2022-10-14 ENCOUNTER — Ambulatory Visit (HOSPITAL_COMMUNITY): Payer: HMO

## 2022-10-19 ENCOUNTER — Ambulatory Visit: Payer: HMO | Admitting: Internal Medicine

## 2022-10-23 NOTE — Progress Notes (Unsigned)
[name], female    DOB: 1934/11/19    MRN: [id number]   Brief patient profile:  59 yowf never smoker with allergic rhinitis worse in spring referred to pulmonary clinic in [place]  01/13/2022 by [facility] for sob > cough   Prior eval and rx by Dr Celine Mans for sob and cough x Nov 2020 (says with covid, not admitted)  assoc with pnds no better with prednisone and never saw allergy / initially otc antihistamines were helping  but not anymore    History of Present Illness  01/13/2022  Pulmonary/ 1st office eval/ [name] / [facility]  Chief Complaint  Patient presents with   New Patient (Initial Visit)    Was seeing Dr. Cherre Huger in [facility] office. Patient states she was seeing him for cough and SOB.  Dyspnea:  assoc with cough since  Nov 2020 / says could only do "3 laps" at end of cardiac  rehab was supposed to do 6 - walking one side of house to the other (rehab notes say she did 800 feet in 6 min with lowest sats 90% on 12/17/21 last session)  Cough: typically worse p walks/ very small amt mucoid sputum, worse with voice use  Sleep: on back/side / bed is flat does not wake up with excess mucus  SABA use: none   Rec Pantoprazole (protonix) 40 mg   Take  30-60 min before first meal of the day and Pepcid (famotidine)  20 mg after supper  GERD  diet/bed blocks Labs: Eos 0.1 /  IgE 256  > referred to allergy > does not recall being referred)  Esr  48    02/23/2022  f/u ov/[place] office/[name] re: onset doe Nov 2020 p covid / bronchiectasis on CT   maint on gerd rx   Chief Complaint  Patient presents with   Follow-up    Breathing and cough are about the same since last ov  Having a lot of foot pain in right foot.   Dyspnea:  now  pushing buggy at [employer], using [facility] parking once a week  Cough: some cough at hs but does not keep her up, some worse p stirring in am/ with deep breaths or signing  / still using cough drops  Allegra didn't help  Sleeping: bed is flat with pillows   SABA use: none  02: none  Covid status: x 4 vax  Rec Make sure you check your oxygen saturation at your highest level of activity to be sure it stays over 90% .  Add zyrtec 10 mg an hour before bed (can take it same time famotidine)  My office will be contacting you by phone for referral to Allergy   GERD rx       07/13/2022  f/u ov/[place] office/[name] re: bronchiectasis but cc is cough when talking  maint on gerd rx and otc antihistamines and no allergy eval yet  - did not try zyrtec as rec  Chief Complaint  Patient presents with   Follow-up    Breathing and cough are same since last ov   Dyspnea:  no change in doe/ tolerable walks with cane  Cough: assoc pnds still worse at bedtime but then resolves/ mostly dry   Sleeping: flat bed/ 2 pillows under head  SABA use: none  02: none  Rec My office will be contacting you by phone for referral to Allergist   - if you don't hear back from my office within one week please call  us back or notify us thru MyChart and we'll address it right away.  Gabapentin 100 mg three times a day - stop it if balance worsens Make sure you check your oxygen saturation at your highest level of activity  (NOT after you stop)  to be sure it stays over 90% Please schedule a follow up visit in 3 months but call sooner if needed - bring your cane    -  HRCT  08/30/22 slightly worsened since 12/10/2021 chest CT. Findings are categorized as probable UIP> f/u serially in office with walking sats    10/24/2022  f/u ov/[place] office/[name] re: UIP maint on pepcid only   Chief Complaint  Patient presents with   Follow-up    Pt f/u for chronic cough verbalized her breathing is baseline and does not use oxygen  Dyspnea:  still doing grocery store / walking with cane / not checking sats as rec  Cough: better on only pepcid  Sleeping: no problem flat bed, no pillows  SABA use: none  02: none    No obvious day to day or daytime variability or assoc excess/  purulent sputum or mucus plugs or hemoptysis or cp or chest tightness, subjective wheeze or overt sinus or hb symptoms.   Sleeping  without nocturnal  or early am exacerbation  of respiratory  c/o's or need for noct saba. Also denies any obvious fluctuation of symptoms with weather or environmental changes or other aggravating or alleviating factors except as outlined above   No unusual exposure hx or h/o childhood pna/ asthma or knowledge of premature birth.  Current Allergies, Complete Past Medical History, Past Surgical History, Family History, and Social History were reviewed in [employer] record.  ROS  The following are not active complaints unless bolded Hoarseness, sore throat, dysphagia, dental problems, itching, sneezing,  nasal congestion or discharge of excess mucus or purulent secretions, ear ache,   fever, chills, sweats, unintended wt loss or wt gain, classically pleuritic or exertional cp,  orthopnea pnd or arm/hand swelling  or leg swelling, presyncope, palpitations, abdominal pain, anorexia, nausea, vomiting, diarrhea  or change in bowel habits or change in bladder habits, change in stools or change in urine, dysuria, hematuria,  rash, arthralgias, visual complaints, headache, numbness, weakness or ataxia or problems with walking or coordination,  change in mood or  memory.        Current Meds  Medication Sig   aspirin EC 81 MG tablet Take 81 mg by mouth daily. Swallow whole.   atorvastatin (LIPITOR) 80 MG tablet Take 80 mg by mouth daily at 6 PM.   ciclopirox (PENLAC) 8 % solution Apply 1 Application topically at bedtime. Apply over nail and surrounding skin. Apply daily over previous coat. After seven (7) days, may remove with alcohol and continue cycle.   fluticasone (FLONASE) 50 MCG/ACT nasal spray Place 1-2 sprays each nostril daily. Aim upward and outward.   furosemide (LASIX) 40 MG tablet Take 1 tablet (40 mg total) by mouth daily.   gabapentin  (NEURONTIN) 100 MG capsule TAKE ONE CAPSULE BY MOUTH THREE TIMES DAILY   glipiZIDE (GLUCOTROL XL) 5 MG 24 hr tablet Take 5 mg by mouth 2 (two) times daily.   ipratropium (ATROVENT) 0.03 % nasal spray Place 2 sprays in each nostril up to three times daily as needed for runny nose   latanoprost (XALATAN) 0.005 % ophthalmic solution Place 1 drop into both eyes at bedtime.   metoprolol succinate (TOPROL-XL) 25 MG 24  hr tablet Take 25 mg by mouth daily.   nitroGLYCERIN (NITROSTAT) 0.4 MG SL tablet Place 1 tablet (0.4 mg total) under the tongue every 5 (five) minutes as needed.   piroxicam (FELDENE) 10 MG capsule Take 10 mg by mouth daily.   valsartan (DIOVAN) 160 MG tablet TAKE 1 TABLET BY MOUTH EVERY DAY   [DISCONTINUED] pantoprazole (PROTONIX) 40 MG tablet Take 1 tablet (40 mg total) by mouth daily before breakfast.            Past Medical History:  Diagnosis Date   CAD (coronary artery disease)    a. s/p NSTEMI in 07/2021 with cath showing minimal CAD and no culprit lesion identified   Cramps, extremity    legs and hands   Diabetes (HCC)    type II   Enterococcus UTI 02/03/2016   H/O cardiovascular stress test 03/05/2009   normal, no ischemia   H/O Doppler ultrasound    lower ext arterial doppler 04/13/09-no evidence of arterial insufficiency, normal values; distal aorta 2.4x2.7   HTN (hypertension)    Murmur, cardiac    echo 02/25/13- EF 60-65%, impaired relaxation-grade 1 diastolic dysfunction, mild concentric lvh, mild to moderate aortic regurgitation   Pneumonia    May 2017   Postoperative anemia due to acute blood loss 02/02/2016   Primary localized osteoarthritis of right knee    TIA (transient ischemic attack)    carotid doppler 05/08/11-normal patency        Objective:    Wts  10/24/2022       165  07/13/2022         163  02/23/22 163 lb 12.8 oz (74.3 kg)  01/13/22 163 lb (73.9 kg)  01/13/22 163 lb (73.9 kg)     Vital signs reviewed  10/24/2022  - Note at rest 02  sats  94% on RA   General appearance:    amb eldelry wf nad    HEENT : Oropharynx  clear     Nasal turbinates nl    NECK :  without  apparent JVD/ palpable Nodes/TM    LUNGS: no acc muscle use, slt kyphotic contour chest with trace insp crackles both bases   CV:  RRR  no s3 or murmur or increase in P2, and no edema   ABD:  soft and nontender with nl inspiratory excursion in the supine position. No bruits or organomegaly appreciated   MS:  Nl gait/ ext warm without deformities Or obvious joint restrictions  calf tenderness, cyanosis or clubbing    SKIN: warm and dry without lesions    NEURO:  alert, approp, nl sensorium with  no motor or cerebellar deficits apparent.          Assessment

## 2022-10-24 ENCOUNTER — Encounter: Payer: Self-pay | Admitting: Internal Medicine

## 2022-10-24 ENCOUNTER — Ambulatory Visit (INDEPENDENT_AMBULATORY_CARE_PROVIDER_SITE_OTHER): Payer: HMO | Admitting: Internal Medicine

## 2022-10-24 VITALS — BP 136/74 | HR 64 | Ht 66.0 in | Wt 165.2 lb

## 2022-10-24 DIAGNOSIS — J84112 Idiopathic pulmonary fibrosis: Secondary | ICD-10-CM | POA: Diagnosis not present

## 2022-10-24 NOTE — Patient Instructions (Addendum)
Famotidine should be 20 mg tablets one after bfast and one after supper   Continue gabapentin 100 mg three times daily    Make sure you check your oxygen saturation at your highest level of activity(NOT after you stop)  to be sure it stays over 90% and keep track of it at least once a week, more often if breathing getting worse, and let me know if losing ground. (Collect the dots to connect the dots approach)    Please schedule a follow up visit in 6 months but call sooner if needed   .

## 2022-10-24 NOTE — Assessment & Plan Note (Signed)
Onset nov 2020 (? covid related)  - See HRCT  08/30/22 slightly worsened since 12/10/2021 chest CT. Findings are categorized as probable UIP  - 10/24/2022   Walked on RA  x  2  lap(s) =  approx 300  ft  @ mod pace, stopped due to hip pain  with lowest 02 sats 90%  and no sob   Most likely she does have UIP but flatly declined referred to PF clinic or trial of antifibrotic at this point, not interested in these options at all   Since cough resolved on gerd rx would rec at least use pepcid bid and if breathing or cough worsen add back ppi qam ac to her regimen  Each maintenance medication was reviewed in detail including emphasizing most importantly the difference between maintenance and prns and under what circumstances the prns are to be triggered using an action plan format where appropriate.  Total time for H and P, chart review, counseling,  directly observing portions of ambulatory 02 saturation study/ and generating customized AVS unique to this office visit / same day charting = 28 min

## 2022-10-25 NOTE — Progress Notes (Unsigned)
Office Visit    Patient Name: [name] Date of Encounter: 10/25/2022  Primary Care Provider:  Ignatius Specking, MD Primary Cardiologist:  Christell Constant, MD Primary Electrophysiologist: None  Chief Complaint    [name] is a 88 y.o. female with PMH of CAD s/p NSTEMI 07/2021 with minimal nonobstructive CAD, moderate aortic regurg, HTN, HLD type II, prior TIA, CKD stage III who presents today for 73-month follow-up of DOE.  Past Medical History    Past Medical History:  Diagnosis Date   CAD (coronary artery disease)    a. s/p NSTEMI in 07/2021 with cath showing minimal CAD and no culprit lesion identified   Constipation    Cramps, extremity    legs and hands   Diabetes    type II   Enterococcus UTI 02/03/2016   H/O cardiovascular stress test 03/05/2009   normal, no ischemia   H/O Doppler ultrasound    lower ext arterial doppler 04/13/09-no evidence of arterial insufficiency, normal values; distal aorta 2.4x2.7   HTN (hypertension)    Murmur, cardiac    echo 02/25/13- EF 60-65%, impaired relaxation-grade 1 diastolic dysfunction, mild concentric lvh, mild to moderate aortic regurgitation   Myocardial infarction    january   Pancreatic cyst    Pneumonia    May 2017   Postoperative anemia due to acute blood loss 02/02/2016   Primary localized osteoarthritis of right knee    Recurrent upper respiratory infection (URI)    TIA (transient ischemic attack)    carotid doppler 05/08/11-normal patency   Past Surgical History:  Procedure Laterality Date   ABDOMINAL HYSTERECTOMY     BIOPSY  03/31/2022   Procedure: BIOPSY;  Surgeon: Lemar Lofty., MD;  Location: [facility];  Service: Gastroenterology;;   BUNIONECTOMY Left    COLONOSCOPY  04/29/2010   Dr. Matthias Hughs; diverticulosis in the entire colon, otherwise normal exam. Recommended repeat colonoscopy in 5 years for surveillance due to personal history of polyps.   ESOPHAGOGASTRODUODENOSCOPY  (EGD) WITH PROPOFOL N/A 03/31/2022   Procedure: ESOPHAGOGASTRODUODENOSCOPY (EGD) WITH PROPOFOL;  Surgeon: Meridee Score Netty Starring., MD;  Location: [facility];  Service: Gastroenterology;  Laterality: N/A;   EUS N/A 03/31/2022   Procedure: UPPER ENDOSCOPIC ULTRASOUND (EUS) LINEAR;  Surgeon: Lemar Lofty., MD;  Location: [facility];  Service: Gastroenterology;  Laterality: N/A;   FINE NEEDLE ASPIRATION  03/31/2022   Procedure: FINE NEEDLE ASPIRATION (FNA) LINEAR;  Surgeon: Lemar Lofty., MD;  Location: [facility];  Service: Gastroenterology;;   Gaspar Bidding DILATION N/A 03/31/2022   Procedure: Gaspar Bidding DILATION;  Surgeon: Lemar Lofty., MD;  Location: [facility];  Service: Gastroenterology;  Laterality: N/A;   TOTAL KNEE ARTHROPLASTY Right 02/01/2016   Procedure: TOTAL KNEE ARTHROPLASTY;  Surgeon: Salvatore Marvel, MD;  Location: [facility];  Service: Orthopedics;  Laterality: Right;    Allergies  Allergies  Allergen Reactions   Other Itching    Chest monitor adhesives    Codeine Nausea Only   Milk-Related Compounds Diarrhea    History of Present Illness    [name]  is a 88 year old female with the above mention past medical history who presents today for follow-up of dyspnea on exertion.  She was recently seen on 10/2014 by Dr. Antoine Poche for evaluation of chest pain.  She described chest pain that was in her back between her shoulder blades.  She underwent a nuclear stress test that was normal and 2D echo demonstrated EF of 55 to 60% with  grade 1 DD.  She was seen on 10/2017 for complaint of dyspnea EKG showed T WI with nuclear stress test completed that showed defect with mild reversibility in the apical region.  She underwent a LHC at [facility] due to NSTEMI that showed nonobstructive coronary disease.  She was last seen by Dr. Izora Ribas on 05/11/2022.  During visit patient reported dyspnea on exertion and had difficulty catching her breath  while at church.  She also reported having to lean on the buggy at the grocery store.  He had Lasix increased to 40 mg daily and 2D echo was repeated that showed EF of 60 to 65% with grade 1 DD and mild LVH, no MV regurgitation.  [name] presents today for follow up. Since last being seen in the office patient reports that she is doing well and experiencing less shortness of breath.  Her blood pressure today is well-controlled at 136/74 and heart rate was 80 bpm.  She is compliant with her medications and denies any adverse reactions.  She reports since her previous visit having issues with remembering short-term task and during today's visit had driven to the wrong office twice.  She also reports missing doses of medications due to her memory.  She was seen recently by her neurologist and was referred to an allergist for assistance with shortness of breath.  She also reports a sensation of feeling a pulse.  She denies any salt indiscretions and is currently taking Lasix 40 mg daily.  She does report occasional episodes of cramping in the legs.  I advised her to increase her fluid intake to at least 64 ounces daily.  She denies any visual changes or dizziness.  Patient denies chest pain, palpitations, dyspnea, PND, orthopnea, nausea, vomiting, dizziness, syncope, edema, weight gain, or early satiety.   Home Medications    Current Outpatient Medications  Medication Sig Dispense Refill   aspirin EC 81 MG tablet Take 81 mg by mouth daily. Swallow whole.     atorvastatin (LIPITOR) 80 MG tablet Take 80 mg by mouth daily at 6 PM.     ciclopirox (PENLAC) 8 % solution Apply 1 Application topically at bedtime. Apply over nail and surrounding skin. Apply daily over previous coat. After seven (7) days, may remove with alcohol and continue cycle.     fluticasone (FLONASE) 50 MCG/ACT nasal spray Place 1-2 sprays each nostril daily. Aim upward and outward. 16 g 5   furosemide (LASIX) 40 MG tablet Take 1 tablet  (40 mg total) by mouth daily. 90 tablet 3   gabapentin (NEURONTIN) 100 MG capsule TAKE ONE CAPSULE BY MOUTH THREE TIMES DAILY 90 capsule 2   glipiZIDE (GLUCOTROL XL) 5 MG 24 hr tablet Take 5 mg by mouth 2 (two) times daily.     ipratropium (ATROVENT) 0.03 % nasal spray Place 2 sprays in each nostril up to three times daily as needed for runny nose 30 mL 5   latanoprost (XALATAN) 0.005 % ophthalmic solution Place 1 drop into both eyes at bedtime.     metoprolol succinate (TOPROL-XL) 25 MG 24 hr tablet Take 25 mg by mouth daily.     nitroGLYCERIN (NITROSTAT) 0.4 MG SL tablet Place 1 tablet (0.4 mg total) under the tongue every 5 (five) minutes as needed. 25 tablet 3   piroxicam (FELDENE) 10 MG capsule Take 10 mg by mouth daily.     valsartan (DIOVAN) 160 MG tablet TAKE 1 TABLET BY MOUTH EVERY DAY 90 tablet 3  No current facility-administered medications for this visit.     Review of Systems  Please see the history of present illness.    (+) Forgetfulness,  (+) Cramping in the lower extremities  All other systems reviewed and are otherwise negative except as noted above.  Physical Exam    Wt Readings from Last 3 Encounters:  10/24/22 165 lb 3.2 oz (74.9 kg)  10/12/22 165 lb 3.2 oz (74.9 kg)  10/02/22 165 lb (74.8 kg)   RU:EAVWU were no vitals filed for this visit.,There is no height or weight on file to calculate BMI.  Constitutional:      Appearance: Healthy appearance. Not in distress.  Neck:     Vascular: JVD normal.  Pulmonary:     Effort: Pulmonary effort is normal.     Breath sounds: No wheezing. No rales. Diminished in the bases Cardiovascular:     Normal rate. Regular rhythm. Normal S1. Normal S2.      Murmurs: There is no murmur.  Edema:    Peripheral edema absent.  Abdominal:     Palpations: Abdomen is soft non tender. There is no hepatomegaly.  Skin:    General: Skin is warm and dry.  Neurological:     General: No focal deficit present.     Mental Status: Alert  and oriented to person, place and time.     Cranial Nerves: Cranial nerves are intact.  EKG/LABS/ Recent Cardiac Studies    ECG personally reviewed by me today -none completed today  Lab Results  Component Value Date   WBC 8.7 10/02/2022   HGB 12.5 10/02/2022   HCT 37.6 10/02/2022   MCV 94.2 10/02/2022   PLT 136 (L) 10/02/2022   Lab Results  Component Value Date   CREATININE 1.74 (H) 10/02/2022   BUN 46 (H) 10/02/2022   NA 134 (L) 10/02/2022   K 4.1 10/02/2022   CL 98 10/02/2022   CO2 26 10/02/2022   Lab Results  Component Value Date   ALT 29 10/02/2022   AST 46 (H) 10/02/2022   ALKPHOS 195 (H) 10/02/2022   BILITOT 1.5 (H) 10/02/2022   Lab Results  Component Value Date   CHOL 165 03/01/2013   HDL 43 03/01/2013   LDLCALC 79 03/01/2013   TRIG 214 (H) 03/01/2013   CHOLHDL 3.8 03/01/2013    Lab Results  Component Value Date   HGBA1C 5.9 (H) 01/22/2016    Cardiac Studies & Procedures     STRESS TESTS  NM MYOCAR MULTI W/SPECT W 11/03/2017  Narrative  There was no ST segment deviation noted during stress.  This is a low risk study.  The left ventricular ejection fraction is normal (55-65%).  Small mild intensity apical defect with mild reversibility and normal wall motion. Suggests differences in apical thinning vs mild apical infarct with mild peri-infarct ischemia. Either finding would support low risk   ECHOCARDIOGRAM  ECHOCARDIOGRAM COMPLETE 05/25/2022  Narrative ECHOCARDIOGRAM REPORT    Patient Name:   [name] Date of Exam: 05/25/2022 Medical Rec #:  [id number]           Height:       68.0 in Accession #:    [id number]          Weight:       162.0 lb Date of Birth:  1935/04/05           BSA:          1.869 m Patient Age:    65 years  BP:           126/75 mmHg Patient Gender: F                   HR:           77 bpm. [facility]  Procedure: 2D Echo, Cardiac Doppler and Color Doppler  Indications:    R06.09  (ICD-10-CM) - DOE (dyspnea on exertion)  History:        Patient has prior history of Echocardiogram examinations, most recent 10/23/2017. CAD and Previous Myocardial Infarction, TIA, Signs/Symptoms:Murmur; Risk Factors:Hypertension and Diabetes.  Sonographer:    Celesta Gentile RCS Referring Phys: [id number] [facility] A CHANDRASEKHAR  IMPRESSIONS   1. Left ventricular ejection fraction, by estimation, is 60 to 65%. The left ventricle has normal function. The left ventricle has no regional wall motion abnormalities. There is mild left ventricular hypertrophy. Left ventricular diastolic parameters are consistent with Grade I diastolic dysfunction (impaired relaxation). 2. Right ventricular systolic function is normal. The right ventricular size is normal. Tricuspid regurgitation signal is inadequate for assessing PA pressure. 3. The mitral valve is normal in structure. No evidence of mitral valve regurgitation. No evidence of mitral stenosis. 4. The tricuspid valve is abnormal. 5. The aortic valve is tricuspid. Aortic valve regurgitation is mild. No aortic stenosis is present. 6. The inferior vena cava is normal in size with greater than 50% respiratory variability, suggesting right atrial pressure of 3 mmHg.  FINDINGS Left Ventricle: Left ventricular ejection fraction, by estimation, is 60 to 65%. The left ventricle has normal function. The left ventricle has no regional wall motion abnormalities. The left ventricular internal cavity size was normal in size. There is mild left ventricular hypertrophy. Left ventricular diastolic parameters are consistent with Grade I diastolic dysfunction (impaired relaxation). Normal left ventricular filling pressure.  Right Ventricle: The right ventricular size is normal. Right vetricular wall thickness was not well visualized. Right ventricular systolic function is normal. Tricuspid regurgitation signal is inadequate for assessing PA pressure.  Left Atrium: Left  atrial size was normal in size.  Right Atrium: Right atrial size was normal in size.  Pericardium: There is no evidence of pericardial effusion.  Mitral Valve: The mitral valve is normal in structure. No evidence of mitral valve regurgitation. No evidence of mitral valve stenosis.  Tricuspid Valve: The tricuspid valve is abnormal. Tricuspid valve regurgitation is mild . No evidence of tricuspid stenosis.  Aortic Valve: The aortic valve is tricuspid. Aortic valve regurgitation is mild. Aortic regurgitation PHT measures 517 msec. No aortic stenosis is present. Aortic valve mean gradient measures 3.3 mmHg. Aortic valve peak gradient measures 7.1 mmHg. Aortic valve area, by VTI measures 2.09 cm.  Pulmonic Valve: The pulmonic valve was not well visualized. Pulmonic valve regurgitation is not visualized. No evidence of pulmonic stenosis.  Aorta: The aortic root is normal in size and structure.  Venous: The inferior vena cava is normal in size with greater than 50% respiratory variability, suggesting right atrial pressure of 3 mmHg.  IAS/Shunts: No atrial level shunt detected by color flow Doppler.   LEFT VENTRICLE PLAX 2D LVIDd:         5.30 cm   Diastology LVIDs:         3.20 cm   LV e' medial:    3.70 cm/s LV PW:         1.20 cm   LV E/e' medial:  12.3 LV IVS:        1.30  cm   LV e' lateral:   4.57 cm/s LVOT diam:     1.90 cm   LV E/e' lateral: 9.9 LV SV:         54 LV SV Index:   29 LVOT Area:     2.84 cm   RIGHT VENTRICLE RV S prime:     12.90 cm/s TAPSE (M-mode): 1.6 cm  LEFT ATRIUM             Index        RIGHT ATRIUM           Index LA diam:        4.20 cm 2.25 cm/m   RA Area:     14.20 cm LA Vol (A2C):   45.7 ml 24.46 ml/m  RA Volume:   26.90 ml  14.40 ml/m LA Vol (A4C):   50.7 ml 27.13 ml/m LA Biplane Vol: 48.3 ml 25.85 ml/m AORTIC VALVE AV Area (Vmax):    2.16 cm AV Area (Vmean):   2.26 cm AV Area (VTI):     2.09 cm AV Vmax:           133.67 cm/s AV  Vmean:          82.709 cm/s AV VTI:            0.256 m AV Peak Grad:      7.1 mmHg AV Mean Grad:      3.3 mmHg LVOT Vmax:         102.00 cm/s LVOT Vmean:        65.800 cm/s LVOT VTI:          0.189 m LVOT/AV VTI ratio: 0.74 AI PHT:            517 msec  AORTA Ao Root diam: 3.50 cm  MITRAL VALVE MV Area (PHT): 2.91 cm    SHUNTS MV Decel Time: 261 msec    Systemic VTI:  0.19 m MV E velocity: 45.40 cm/s  Systemic Diam: 1.90 cm MV A velocity: 96.00 cm/s MV E/A ratio:  0.47  Dina Rich MD Electronically signed by Dina Rich MD Signature Date/Time: 05/25/2022/10:47:16 AM    Final             Assessment & Plan    1.  Dyspnea on exertion: -Today patient reports improvement to shortness of breath after following up with an allergist. -Continue Lasix 40 mg daily  2.  Nonobstructive CAD: -s/p NSTEMI in 07/2021 with minimal coronary artery disease per LHC. -Today patient reports no chest pain or anginal equivalent. -Continue GDMT with ASA 81 mg, Lipitor 80 mg, Toprol 25 mg daily  3.  History of TIA: -Patient has history of TIA and today reports some forgetfulness and memory lapse. -She denies any headaches, expressive aphasia, or dizziness -Continue ASA 81 mg and atorvastatin 80 mg daily -Ambulatory referral to neurology  -4.  Nonrheumatic aortic insufficiency: -Most recent 2D echo completed showing EF of 60 to 65% with mean gradient of 7.1 mmHg Today patient reports shortness of breath -Continue Toprol-XL 25 mg, Lasix 40 mg daily  5.  Memory changes: -Today patient reports changes in memory with forgetfulness regarding her medications and short-term memory lapses -We will refer to neurology as noted above.  Disposition: Follow-up with Christell Constant, MD or APP in 6 months   Medication Adjustments/Labs and Tests Ordered: Current medicines are reviewed at length with the patient today.  Concerns regarding medicines are outlined above.   Signed, Louanne Skye  Leavy Cella, NP 10/25/2022, 7:30 AM [facility]  Note:  This document was prepared using Dragon voice recognition software and may include unintentional dictation errors.

## 2022-10-26 ENCOUNTER — Encounter: Payer: Self-pay | Admitting: Nurse Practitioner

## 2022-10-26 ENCOUNTER — Ambulatory Visit: Payer: HMO | Attending: Internal Medicine | Admitting: Nurse Practitioner

## 2022-10-26 VITALS — BP 136/74 | HR 80 | Ht 66.0 in | Wt 166.2 lb

## 2022-10-26 DIAGNOSIS — G459 Transient cerebral ischemic attack, unspecified: Secondary | ICD-10-CM | POA: Diagnosis not present

## 2022-10-26 DIAGNOSIS — I351 Nonrheumatic aortic (valve) insufficiency: Secondary | ICD-10-CM | POA: Diagnosis not present

## 2022-10-26 DIAGNOSIS — R413 Other amnesia: Secondary | ICD-10-CM

## 2022-10-26 DIAGNOSIS — I10 Essential (primary) hypertension: Secondary | ICD-10-CM | POA: Diagnosis not present

## 2022-10-26 DIAGNOSIS — R0609 Other forms of dyspnea: Secondary | ICD-10-CM

## 2022-10-26 NOTE — Patient Instructions (Addendum)
Medication Instructions:  Your physician recommends that you continue on your current medications as directed. Please refer to the Current Medication list given to you today. *If you need a refill on your cardiac medications before your next appointment, please call your pharmacy* WE RECOMMEND YOU GET GATORADE ZERO  Lab Work: None ordered If you have labs (blood work) drawn today and your tests are completely normal, you will receive your results only by: MyChart Message (if you have MyChart) OR A paper copy in the mail If you have any lab test that is abnormal or we need to change your treatment, we will call you to review the results.   Testing/Procedures: None ordered   Follow-Up: At [facility], you and your health needs are our priority.  As part of our continuing mission to provide you with exceptional heart care, we have created designated Provider Care Teams.  These Care Teams include your primary Cardiologist (physician) and Advanced Practice Providers (APPs -  Physician Assistants and Nurse Practitioners) who all work together to provide you with the care you need, when you need it.  We recommend signing up for the patient portal called "MyChart".  Sign up information is provided on this After Visit Summary.  MyChart is used to connect with patients for Virtual Visits (Telemedicine).  Patients are able to view lab/test results, encounter notes, upcoming appointments, etc.  Non-urgent messages can be sent to your provider as well.   To learn more about what you can do with MyChart, go to [email or web address].    Your next appointment:   6 month(s)  Provider:   Christell Constant, MD  or Robin Searing, NP  Other Instructions  You have been referred to Neurology

## 2022-10-27 ENCOUNTER — Ambulatory Visit: Payer: HMO | Admitting: Internal Medicine

## 2022-11-08 DIAGNOSIS — I10 Essential (primary) hypertension: Secondary | ICD-10-CM | POA: Diagnosis not present

## 2022-11-08 DIAGNOSIS — E1165 Type 2 diabetes mellitus with hyperglycemia: Secondary | ICD-10-CM | POA: Diagnosis not present

## 2022-11-14 DIAGNOSIS — Z7984 Long term (current) use of oral hypoglycemic drugs: Secondary | ICD-10-CM | POA: Diagnosis not present

## 2022-11-14 DIAGNOSIS — M79671 Pain in right foot: Secondary | ICD-10-CM | POA: Diagnosis not present

## 2022-11-14 DIAGNOSIS — M25571 Pain in right ankle and joints of right foot: Secondary | ICD-10-CM | POA: Diagnosis not present

## 2022-11-14 DIAGNOSIS — X58XXXA Exposure to other specified factors, initial encounter: Secondary | ICD-10-CM | POA: Diagnosis not present

## 2022-11-14 DIAGNOSIS — S93401A Sprain of unspecified ligament of right ankle, initial encounter: Secondary | ICD-10-CM | POA: Diagnosis not present

## 2022-11-14 DIAGNOSIS — M21619 Bunion of unspecified foot: Secondary | ICD-10-CM | POA: Diagnosis not present

## 2022-11-17 DIAGNOSIS — M10071 Idiopathic gout, right ankle and foot: Secondary | ICD-10-CM | POA: Diagnosis not present

## 2022-11-17 DIAGNOSIS — M79671 Pain in right foot: Secondary | ICD-10-CM | POA: Diagnosis not present

## 2022-11-22 ENCOUNTER — Ambulatory Visit: Payer: HMO | Admitting: Internal Medicine

## 2022-12-05 ENCOUNTER — Other Ambulatory Visit: Payer: Self-pay

## 2022-12-05 ENCOUNTER — Encounter (HOSPITAL_COMMUNITY): Payer: Self-pay

## 2022-12-05 ENCOUNTER — Emergency Department (HOSPITAL_COMMUNITY): Payer: HMO

## 2022-12-05 ENCOUNTER — Observation Stay (HOSPITAL_COMMUNITY)
Admission: EM | Admit: 2022-12-05 | Discharge: 2022-12-08 | Disposition: A | Discharge status: Home Health | Payer: HMO | Attending: Family Medicine | Admitting: Family Medicine

## 2022-12-05 DIAGNOSIS — Z96651 Presence of right artificial knee joint: Secondary | ICD-10-CM | POA: Insufficient documentation

## 2022-12-05 DIAGNOSIS — I10 Essential (primary) hypertension: Secondary | ICD-10-CM

## 2022-12-05 DIAGNOSIS — E119 Type 2 diabetes mellitus without complications: Secondary | ICD-10-CM

## 2022-12-05 DIAGNOSIS — M79671 Pain in right foot: Secondary | ICD-10-CM | POA: Diagnosis not present

## 2022-12-05 DIAGNOSIS — Z8673 Personal history of transient ischemic attack (TIA), and cerebral infarction without residual deficits: Secondary | ICD-10-CM | POA: Insufficient documentation

## 2022-12-05 DIAGNOSIS — N179 Acute kidney failure, unspecified: Principal | ICD-10-CM | POA: Insufficient documentation

## 2022-12-05 DIAGNOSIS — M109 Gout, unspecified: Secondary | ICD-10-CM | POA: Diagnosis not present

## 2022-12-05 DIAGNOSIS — M79605 Pain in left leg: Secondary | ICD-10-CM | POA: Insufficient documentation

## 2022-12-05 DIAGNOSIS — Z794 Long term (current) use of insulin: Secondary | ICD-10-CM | POA: Diagnosis not present

## 2022-12-05 DIAGNOSIS — E790 Hyperuricemia without signs of inflammatory arthritis and tophaceous disease: Secondary | ICD-10-CM | POA: Insufficient documentation

## 2022-12-05 DIAGNOSIS — I2510 Atherosclerotic heart disease of native coronary artery without angina pectoris: Secondary | ICD-10-CM | POA: Diagnosis not present

## 2022-12-05 DIAGNOSIS — M79673 Pain in unspecified foot: Secondary | ICD-10-CM | POA: Diagnosis not present

## 2022-12-05 DIAGNOSIS — M79604 Pain in right leg: Secondary | ICD-10-CM | POA: Diagnosis present

## 2022-12-05 DIAGNOSIS — N184 Chronic kidney disease, stage 4 (severe): Secondary | ICD-10-CM | POA: Insufficient documentation

## 2022-12-05 DIAGNOSIS — I129 Hypertensive chronic kidney disease with stage 1 through stage 4 chronic kidney disease, or unspecified chronic kidney disease: Secondary | ICD-10-CM | POA: Diagnosis not present

## 2022-12-05 DIAGNOSIS — E1122 Type 2 diabetes mellitus with diabetic chronic kidney disease: Secondary | ICD-10-CM | POA: Insufficient documentation

## 2022-12-05 DIAGNOSIS — M7989 Other specified soft tissue disorders: Secondary | ICD-10-CM | POA: Diagnosis not present

## 2022-12-05 DIAGNOSIS — E1322 Other specified diabetes mellitus with diabetic chronic kidney disease: Secondary | ICD-10-CM | POA: Diagnosis not present

## 2022-12-05 DIAGNOSIS — Z7982 Long term (current) use of aspirin: Secondary | ICD-10-CM | POA: Insufficient documentation

## 2022-12-05 DIAGNOSIS — M79672 Pain in left foot: Secondary | ICD-10-CM | POA: Diagnosis not present

## 2022-12-05 LAB — CBC WITH DIFFERENTIAL/PLATELET
Abs Immature Granulocytes: 0.02 10*3/uL (ref 0.00–0.07)
Basophils Absolute: 0.1 10*3/uL (ref 0.0–0.1)
Basophils Relative: 1 %
Eosinophils Absolute: 0 10*3/uL (ref 0.0–0.5)
Eosinophils Relative: 0 %
HCT: 34.8 % — ABNORMAL LOW (ref 36.0–46.0)
Hemoglobin: 11.4 g/dL — ABNORMAL LOW (ref 12.0–15.0)
Immature Granulocytes: 0 %
Lymphocytes Relative: 8 %
Lymphs Abs: 0.6 10*3/uL — ABNORMAL LOW (ref 0.7–4.0)
MCH: 30.5 pg (ref 26.0–34.0)
MCHC: 32.8 g/dL (ref 30.0–36.0)
MCV: 93 fL (ref 80.0–100.0)
Monocytes Absolute: 0.9 10*3/uL (ref 0.1–1.0)
Monocytes Relative: 11 %
Neutro Abs: 6.5 10*3/uL (ref 1.7–7.7)
Neutrophils Relative %: 80 %
Platelets: 128 10*3/uL — ABNORMAL LOW (ref 150–400)
RBC: 3.74 MIL/uL — ABNORMAL LOW (ref 3.87–5.11)
RDW: 12.2 % (ref 11.5–15.5)
WBC: 8.1 10*3/uL (ref 4.0–10.5)
nRBC: 0 % (ref 0.0–0.2)

## 2022-12-05 LAB — GLUCOSE, CAPILLARY
Glucose-Capillary: 127 mg/dL — ABNORMAL HIGH (ref 70–99)
Glucose-Capillary: 177 mg/dL — ABNORMAL HIGH (ref 70–99)
Glucose-Capillary: 194 mg/dL — ABNORMAL HIGH (ref 70–99)

## 2022-12-05 LAB — BASIC METABOLIC PANEL
Anion gap: 14 (ref 5–15)
BUN: 38 mg/dL — ABNORMAL HIGH (ref 8–23)
CO2: 22 mmol/L (ref 22–32)
Calcium: 9 mg/dL (ref 8.9–10.3)
Chloride: 97 mmol/L — ABNORMAL LOW (ref 98–111)
Creatinine, Ser: 2.46 mg/dL — ABNORMAL HIGH (ref 0.44–1.00)
GFR, Estimated: 19 mL/min — ABNORMAL LOW (ref >60)
Glucose, Bld: 129 mg/dL — ABNORMAL HIGH (ref 70–99)
Potassium: 3.5 mmol/L (ref 3.5–5.1)
Sodium: 133 mmol/L — ABNORMAL LOW (ref 135–145)

## 2022-12-05 MED ORDER — INSULIN ASPART 100 UNIT/ML IJ SOLN
0.0000 [IU] | Freq: Three times a day (TID) | INTRAMUSCULAR | Status: DC
  Administered 2022-12-06 (×2): 2 [IU] via SUBCUTANEOUS
  Administered 2022-12-06: 5 [IU] via SUBCUTANEOUS
  Administered 2022-12-07: 2 [IU] via SUBCUTANEOUS
  Administered 2022-12-07: 1 [IU] via SUBCUTANEOUS
  Administered 2022-12-07: 5 [IU] via SUBCUTANEOUS
  Administered 2022-12-08: 1 [IU] via SUBCUTANEOUS
  Administered 2022-12-08: 2 [IU] via SUBCUTANEOUS

## 2022-12-05 MED ORDER — PREDNISONE 20 MG PO TABS
40.0000 mg | ORAL_TABLET | Freq: Every day | ORAL | Status: DC
  Administered 2022-12-06 – 2022-12-08 (×3): 40 mg via ORAL
  Filled 2022-12-05 (×3): qty 2

## 2022-12-05 MED ORDER — PANTOPRAZOLE SODIUM 40 MG PO TBEC
40.0000 mg | DELAYED_RELEASE_TABLET | Freq: Every morning | ORAL | Status: DC
  Administered 2022-12-06 – 2022-12-08 (×3): 40 mg via ORAL
  Filled 2022-12-05 (×3): qty 1

## 2022-12-05 MED ORDER — ONDANSETRON HCL 4 MG/2ML IJ SOLN: 4.0000 mg | Freq: Four times a day (QID) | INTRAMUSCULAR | Status: DC | PRN

## 2022-12-05 MED ORDER — PREDNISONE 50 MG PO TABS
60.0000 mg | ORAL_TABLET | Freq: Once | ORAL | Status: AC
  Administered 2022-12-05: 60 mg via ORAL
  Filled 2022-12-05: qty 1

## 2022-12-05 MED ORDER — POTASSIUM CHLORIDE CRYS ER 20 MEQ PO TBCR
40.0000 meq | EXTENDED_RELEASE_TABLET | Freq: Once | ORAL | Status: AC
  Administered 2022-12-05: 40 meq via ORAL
  Filled 2022-12-05: qty 2

## 2022-12-05 MED ORDER — ONDANSETRON HCL 4 MG PO TABS
4.0000 mg | ORAL_TABLET | Freq: Four times a day (QID) | ORAL | Status: DC | PRN
  Administered 2022-12-05: 4 mg via ORAL
  Filled 2022-12-05: qty 1

## 2022-12-05 MED ORDER — SODIUM CHLORIDE 0.9 % IV SOLN: INTRAVENOUS | Status: AC

## 2022-12-05 MED ORDER — OXYCODONE-ACETAMINOPHEN 5-325 MG PO TABS
1.0000 | ORAL_TABLET | Freq: Once | ORAL | Status: AC
  Administered 2022-12-05: 1 via ORAL
  Filled 2022-12-05: qty 1

## 2022-12-05 MED ORDER — INSULIN ASPART 100 UNIT/ML IJ SOLN: 0.0000 [IU] | Freq: Every day | INTRAMUSCULAR | Status: DC

## 2022-12-05 MED ORDER — POLYETHYLENE GLYCOL 3350 17 G PO PACK: 17.0000 g | PACK | Freq: Every day | ORAL | Status: DC | PRN

## 2022-12-05 MED ORDER — HEPARIN SODIUM (PORCINE) 5000 UNIT/ML IJ SOLN
5000.0000 [IU] | Freq: Three times a day (TID) | INTRAMUSCULAR | Status: DC
  Administered 2022-12-05 – 2022-12-08 (×8): 5000 [IU] via SUBCUTANEOUS
  Filled 2022-12-05 (×8): qty 1

## 2022-12-05 MED ORDER — ACETAMINOPHEN 650 MG RE SUPP: 650.0000 mg | Freq: Four times a day (QID) | RECTAL | Status: DC | PRN

## 2022-12-05 MED ORDER — ACETAMINOPHEN 325 MG PO TABS: 650.0000 mg | ORAL_TABLET | Freq: Four times a day (QID) | ORAL | Status: DC | PRN

## 2022-12-05 MED ORDER — SODIUM CHLORIDE 0.9 % IV SOLN: INTRAVENOUS | Status: DC

## 2022-12-05 MED ORDER — LACTATED RINGERS IV BOLUS
1000.0000 mL | Freq: Once | INTRAVENOUS | Status: AC
  Administered 2022-12-05: 1000 mL via INTRAVENOUS

## 2022-12-05 NOTE — H&P (Signed)
History and Physical    [name]:[id number] DOB: 10/04/1934 DOA: 12/05/2022  PCP: Pcp, No   Patient coming from: Home  I have personally briefly reviewed patient's old medical records in [facility]  Chief Complaint: Foot pain  HPI: [name] is a 88 y.o. female with medical history significant for diabetes mellitus, hypertension, coronary artery disease.  Patient presented to the ED with complaints of bilateral foot pain over the past 2 to 3 weeks.  It is worse on the right foot with weightbearing making it difficulty to walk, and involving the 1st  MTP, and calcaneus.  She has some redness to the first MTP joint but she reports this is mostly chronic. She denies vomiting, no loose stools and has maintained okay oral intake.  No Fevers no chills.  ED Course: Temperature 98.1.  Heart rate 58-81.  Respirate rate 18.  Blood pressure systolic 108-149.  X-ray of the left and right foot negative for acute abnormality, shows severe first MTP joint arthritis  hallux valgus deformity and hammertoe deformities. Creatinine elevated at 2.46. 1 L LR bolus given, 60 mg prednisone given for possible gout.  Review of Systems: As per HPI all other systems reviewed and negative.  Past Medical History:  Diagnosis Date   CAD (coronary artery disease)    a. s/p NSTEMI in 07/2021 with cath showing minimal CAD and no culprit lesion identified   Constipation    Cramps, extremity    legs and hands   Diabetes (HCC)    type II   Enterococcus UTI 02/03/2016   H/O cardiovascular stress test 03/05/2009   normal, no ischemia   H/O Doppler ultrasound    lower ext arterial doppler 04/13/09-no evidence of arterial insufficiency, normal values; distal aorta 2.4x2.7   HTN (hypertension)    Murmur, cardiac    echo 02/25/13- EF 60-65%, impaired relaxation-grade 1 diastolic dysfunction, mild concentric lvh, mild to moderate aortic regurgitation   Myocardial infarction [facility])    january    Pancreatic cyst    Pneumonia    May 2017   Postoperative anemia due to acute blood loss 02/02/2016   Primary localized osteoarthritis of right knee    Recurrent upper respiratory infection (URI)    TIA (transient ischemic attack)    carotid doppler 05/08/11-normal patency    Past Surgical History:  Procedure Laterality Date   ABDOMINAL HYSTERECTOMY     BIOPSY  03/31/2022   Procedure: BIOPSY;  Surgeon: Lemar Lofty., MD;  Location: [facility];  Service: Gastroenterology;;   BUNIONECTOMY Left    COLONOSCOPY  04/29/2010   Dr. Matthias Hughs; diverticulosis in the entire colon, otherwise normal exam. Recommended repeat colonoscopy in 5 years for surveillance due to personal history of polyps.   ESOPHAGOGASTRODUODENOSCOPY (EGD) WITH PROPOFOL N/A 03/31/2022   Procedure: ESOPHAGOGASTRODUODENOSCOPY (EGD) WITH PROPOFOL;  Surgeon: Meridee Score Netty Starring., MD;  Location: [facility];  Service: Gastroenterology;  Laterality: N/A;   EUS N/A 03/31/2022   Procedure: UPPER ENDOSCOPIC ULTRASOUND (EUS) LINEAR;  Surgeon: Lemar Lofty., MD;  Location: [facility];  Service: Gastroenterology;  Laterality: N/A;   FINE NEEDLE ASPIRATION  03/31/2022   Procedure: FINE NEEDLE ASPIRATION (FNA) LINEAR;  Surgeon: Lemar Lofty., MD;  Location: [facility];  Service: Gastroenterology;;   Gaspar Bidding DILATION N/A 03/31/2022   Procedure: Gaspar Bidding DILATION;  Surgeon: Lemar Lofty., MD;  Location: [facility];  Service: Gastroenterology;  Laterality: N/A;   TOTAL KNEE ARTHROPLASTY Right 02/01/2016   Procedure: TOTAL KNEE ARTHROPLASTY;  Surgeon: Salvatore Marvel, MD;  Location: [facility] OR;  Service: Orthopedics;  Laterality: Right;     reports that she has never smoked. She has never used smokeless tobacco. She reports that she does not drink alcohol and does not use drugs.  Allergies  Allergen Reactions   Other Itching    Chest monitor adhesives    Codeine Nausea Only   Milk-Related  Compounds Diarrhea    Family History  Problem Relation Age of Onset   Stroke Mother    Cancer Father    Lung cancer Father    Cancer Sister    Diabetes Brother    Diabetes Brother    Diabetes Brother    Pancreatic cancer Neg Hx    Allergic rhinitis Neg Hx    Angioedema Neg Hx    Asthma Neg Hx    Eczema Neg Hx     Prior to Admission medications   Medication Sig Start Date End Date Taking? Authorizing Provider  aspirin EC 81 MG tablet Take 81 mg by mouth daily. Swallow whole.    [provider]  atorvastatin (LIPITOR) 80 MG tablet Take 80 mg by mouth daily at 6 PM. 08/17/21   [provider]  cefUROXime (CEFTIN) 250 MG tablet Take 250 mg by mouth 2 (two) times daily with a meal.    [provider]  ciclopirox (PENLAC) 8 % solution Apply 1 Application topically at bedtime. Apply over nail and surrounding skin. Apply daily over previous coat. After seven (7) days, may remove with alcohol and continue cycle.    [provider]  famotidine (PEPCID) 20 MG tablet Take 20 mg by mouth 2 (two) times daily.    [provider]  fluticasone (FLONASE) 50 MCG/ACT nasal spray Place 1-2 sprays each nostril daily. Aim upward and outward. 09/05/22   Birder Robson, MD  furosemide (LASIX) 40 MG tablet Take 1 tablet (40 mg total) by mouth daily. 05/11/22   Christell Constant, MD  gabapentin (NEURONTIN) 100 MG capsule TAKE ONE CAPSULE BY MOUTH THREE TIMES DAILY 09/28/22   Nyoka Cowden, MD  glipiZIDE (GLUCOTROL XL) 5 MG 24 hr tablet Take 5 mg by mouth 2 (two) times daily. 10/08/14   [provider]  ipratropium (ATROVENT) 0.03 % nasal spray Place 2 sprays in each nostril up to three times daily as needed for runny nose 09/05/22   Birder Robson, MD  latanoprost (XALATAN) 0.005 % ophthalmic solution Place 1 drop into both eyes at bedtime. 09/13/21   [provider]  metoprolol succinate (TOPROL-XL) 25 MG 24 hr tablet Take 25 mg by mouth daily.     [provider]  nitroGLYCERIN (NITROSTAT) 0.4 MG SL tablet Place 1 tablet (0.4 mg total) under the tongue every 5 (five) minutes as needed. 09/08/21   Chandrasekhar, Rondel Jumbo, MD  piroxicam (FELDENE) 10 MG capsule Take 10 mg by mouth daily. 10/03/22   [provider]  valsartan (DIOVAN) 160 MG tablet TAKE 1 TABLET BY MOUTH EVERY DAY 08/26/22   Christell Constant, MD    Physical Exam: Vitals:   12/05/22 1157 12/05/22 1230 12/05/22 1500 12/05/22 1526  BP: 108/69 122/73 130/63   Pulse: 81 73 (!) 58   Resp: 18 18 18    Temp: 98.1 F (36.7 C)   97.8 F (36.6 C)  TempSrc: Oral   Oral  SpO2: 97% 93% 96%     Constitutional: NAD, calm, comfortable Vitals:   12/05/22 1157 12/05/22 1230 12/05/22 1500 12/05/22  1526  BP: 108/69 122/73 130/63   Pulse: 81 73 (!) 58   Resp: 18 18 18    Temp: 98.1 F (36.7 C)   97.8 F (36.6 C)  TempSrc: Oral   Oral  SpO2: 97% 93% 96%    Eyes: PERRL, lids and conjunctivae normal ENMT: Mucous membranes are moist.  Neck: normal, supple, no masses, no thyromegaly Respiratory: clear to auscultation bilaterally, no wheezing, no crackles. Normal respiratory effort. No accessory muscle use.  Cardiovascular: Regular rate and rhythm, no murmurs / rubs / gallops. No extremity edema. Abdomen: no tenderness, no masses palpated. No hepatosplenomegaly. Bowel sounds positive.  Musculoskeletal: no clubbing / cyanosis.  Significant valgus deformity to first MTP joint on the right foot .  Erythema/discoloration to same joint, but patient reports this is mostly chronic, with differential warmth. Skin: no rashes, lesions, ulcers. No induration Neurologic: Facial asymmetry, speech fluent, 4+5 strength in all extremity, slightly reduced grip strength bilateral upper extremity, chronic likely from arthritis involving her fingers Psychiatric: Normal judgment and insight. Alert and oriented x 3. Normal mood.   Labs on Admission: I have personally reviewed following  labs and imaging studies  CBC: Recent Labs  Lab 12/05/22 1409  WBC 8.1  NEUTROABS 6.5  HGB 11.4*  HCT 34.8*  MCV 93.0  PLT 128*   Basic Metabolic Panel: Recent Labs  Lab 12/05/22 1409  NA 133*  K 3.5  CL 97*  CO2 22  GLUCOSE 129*  BUN 38*  CREATININE 2.46*  CALCIUM 9.0    Radiological Exams on Admission: DG Foot Complete Right  Result Date: 12/05/2022 CLINICAL DATA:  Bilateral foot pain.  Worse on the right. EXAM: RIGHT FOOT COMPLETE - 3+ VIEW; LEFT FOOT - COMPLETE 3+ VIEW COMPARISON:  None Available. FINDINGS: Left foot: There is no evidence of fracture or dislocation. Degenerative changes at the first metatarsophalangeal joint. Hallux valgus and hammertoe deformities. Plantar calcaneal spurring and Achilles enthesopathy. Soft tissues are unremarkable. Right foot: There is no evidence of fracture or dislocation. Severe first metatarsophalangeal joint arthritis and hallux valgus deformity. Hammertoe deformities. Plantar calcaneal spurring and Achilles enthesopathy. Mild soft tissue swelling about the dorsum of the foot. IMPRESSION: LEFT FOOT: 1. No acute fracture or dislocation. 2. Hallux valgus and hammertoe deformities. 3. Mild first metatarsophalangeal joint arthritis. RIGHT FOOT: 1. No acute fracture or dislocation. 2. Severe first metatarsophalangeal joint arthritis and hallux valgus deformity and hammertoe deformities. Electronically Signed   By: Larose Hires D.O.   On: 12/05/2022 14:34   DG Foot Complete Left  Result Date: 12/05/2022 CLINICAL DATA:  Bilateral foot pain.  Worse on the right. EXAM: RIGHT FOOT COMPLETE - 3+ VIEW; LEFT FOOT - COMPLETE 3+ VIEW COMPARISON:  None Available. FINDINGS: Left foot: There is no evidence of fracture or dislocation. Degenerative changes at the first metatarsophalangeal joint. Hallux valgus and hammertoe deformities. Plantar calcaneal spurring and Achilles enthesopathy. Soft tissues are unremarkable. Right foot: There is no evidence of  fracture or dislocation. Severe first metatarsophalangeal joint arthritis and hallux valgus deformity. Hammertoe deformities. Plantar calcaneal spurring and Achilles enthesopathy. Mild soft tissue swelling about the dorsum of the foot. IMPRESSION: LEFT FOOT: 1. No acute fracture or dislocation. 2. Hallux valgus and hammertoe deformities. 3. Mild first metatarsophalangeal joint arthritis. RIGHT FOOT: 1. No acute fracture or dislocation. 2. Severe first metatarsophalangeal joint arthritis and hallux valgus deformity and hammertoe deformities. Electronically Signed   By: Larose Hires D.O.   On: 12/05/2022 14:34    EKG: None  Assessment/Plan Principal Problem:   AKI (acute kidney injury) (HCC) Active Problems:   Pain in right foot   CKD (chronic kidney disease) stage 4, GFR 15-29 ml/min (HCC)   Diabetes (HCC)   HTN (hypertension)  Assessment and Plan: * AKI (acute kidney injury) (HCC) AKI on CKD 4.  Creatinine elevated 2.46, baseline about 1.7 over the past year.  Likely prerenal, poor free water intake denies GI losses.  In the setting of valsartan, Lasix 40 mg daily- though questionable compliance - 1 Liter bolus given, continue N/s 100cc/hr x 15hrs -Hold valsartan, Lasix  Pain in right foot Mostly fifth MTP, which shows significant valgus normality, also pain to the calcaneus.  Acute pain likely from gout versus chronic problem.  X-rays of both feet negative for acute abnormality. She iIs not on chronic medication for gout. - 60mg  prednisone given in ED, continue 40 mg daily - avoided use of colchicine due to AKI, with GFR of 19  HTN (hypertension) Stable.  Questionable compliance with antihypertensives valsartan, metoprolol and Lasix.   - Will hold medications and monitor for now  Diabetes (HCC) - HgbA1c - SSI- S -Hold glipizide -Monitor glucose while on steroids   DVT prophylaxis: Heparin Code Status: FULL code- Confirmed with patient at bedside Family Communication: None at  bedside Disposition Plan: ~ 1 day Consults called: None  Admission status: Obs Med-surg   Author: Onnie Boer, MD 12/05/2022 6:35 PM  For on call review [email or web address].

## 2022-12-05 NOTE — ED Triage Notes (Signed)
[facility] reports pt coming from home. Pt c/o bilateral foot pain. Pt thinks she has gout. Pain for the past 2-3 weeks with increased difficulty walking the past few days.

## 2022-12-05 NOTE — Assessment & Plan Note (Addendum)
Mostly fifth MTP, which shows significant valgus normality, also pain to the calcaneus.  Acute pain likely from gout versus chronic problem.  X-rays of both feet negative for acute abnormality. She iIs not on chronic medication for gout. - 60mg  prednisone given in ED, continue 40 mg daily - avoided use of colchicine due to AKI, with GFR of 19

## 2022-12-05 NOTE — Assessment & Plan Note (Addendum)
-  Continue holding nephrotoxic agents -Creatinine 2.2 after fluid resuscitation -Continue to follow renal function trend.

## 2022-12-05 NOTE — Assessment & Plan Note (Addendum)
-  Continue to hold oral hypoglycemic agent -Follow A1c results -Continue sliding scale insulin -Patient advised to follow modified carbohydrate diet.

## 2022-12-05 NOTE — ED Provider Notes (Signed)
[place] EMERGENCY DEPARTMENT AT [facility] Provider Note   CSN: [id number] Arrival date & time: 12/05/22  1148     History  Chief Complaint  Patient presents with   Foot Pain    [name] is a 88 y.o. female.   Foot Pain  Patient presents for foot pain.  Medical history includes CAD, DM, HTN, arthritis, GERD.  She was seen at [facility] 3 weeks ago for right foot pain.  X-ray at that time showed hallux valgus with first MTP degenerative changes.  She was diagnosed with a bunion.  She states that she was given 5 days of medication but is does not know what the medication is or what it was for.  Per chart review, it appears that she was prescribed tramadol.  She has had ongoing pain in the area of her right first MTP.  She has also had pain in her right calcaneus and left plantar forefoot.  Pain does not seem worse during the morning.  She states that pain is persistent throughout the day.  It is worsened with ambulation.  She has continued to be able to hobble.  She has a wheelchair at home but is difficult to get around on her wheelchair on her carpet.  She lives alone.  She has occasional help from neighbors.  She is followed by podiatrist.  She states that she goes to a podiatrist for them to trim her toenails.  She has not seen her podiatrist since onset of these symptoms over the past 3 weeks.     Home Medications Prior to Admission medications   Medication Sig Start Date End Date Taking? Authorizing Provider  aspirin EC 81 MG tablet Take 81 mg by mouth daily. Swallow whole.   Yes [provider]  atorvastatin (LIPITOR) 80 MG tablet Take 80 mg by mouth daily. 08/17/21  Yes [provider]  ciclopirox (PENLAC) 8 % solution Apply 1 Application topically at bedtime. Apply over nail and surrounding skin. Apply daily over previous coat. After seven (7) days, may remove with alcohol and continue cycle.   Yes [provider]  famotidine  (PEPCID) 20 MG tablet Take 20 mg by mouth 2 (two) times daily.   Yes [provider]  fluticasone (FLONASE) 50 MCG/ACT nasal spray Place 1-2 sprays each nostril daily. Aim upward and outward. 09/05/22  Yes Birder Robson, MD  furosemide (LASIX) 40 MG tablet Take 1 tablet (40 mg total) by mouth daily. 05/11/22  Yes Chandrasekhar, Mahesh A, MD  gabapentin (NEURONTIN) 100 MG capsule TAKE ONE CAPSULE BY MOUTH THREE TIMES DAILY 09/28/22  Yes Nyoka Cowden, MD  glipiZIDE (GLUCOTROL XL) 5 MG 24 hr tablet Take 5 mg by mouth 2 (two) times daily. 10/08/14  Yes [provider]  ipratropium (ATROVENT) 0.03 % nasal spray Place 2 sprays in each nostril up to three times daily as needed for runny nose 09/05/22  Yes Patel, Priya P, MD  latanoprost (XALATAN) 0.005 % ophthalmic solution Place 1 drop into both eyes at bedtime. 09/13/21  Yes [provider]  metoprolol succinate (TOPROL-XL) 25 MG 24 hr tablet Take 25 mg by mouth daily.   Yes [provider]  nitroGLYCERIN (NITROSTAT) 0.4 MG SL tablet Place 1 tablet (0.4 mg total) under the tongue every 5 (five) minutes as needed. 09/08/21  Yes Chandrasekhar, Mahesh A, MD  pantoprazole (PROTONIX) 40 MG tablet Take 40 mg by mouth every morning. 11/03/22  Yes [provider]  valsartan (DIOVAN) 160 MG tablet TAKE 1 TABLET BY MOUTH EVERY DAY 08/26/22  Yes Chandrasekhar, Mahesh A, MD      Allergies    Other, Codeine, and Milk-related compounds    Review of Systems   Review of Systems  Musculoskeletal:  Positive for arthralgias.  All other systems reviewed and are negative.   Physical Exam Updated Vital Signs BP (!) 149/66   Pulse (!) 59   Temp 97.7 F (36.5 C) (Oral)   Resp 18   Ht 5\' 6"  (1.676 m)   Wt 74.2 kg   SpO2 100%   BMI 26.40 kg/m  Physical Exam Vitals and nursing note reviewed.  Constitutional:      General: She is not in acute distress.    Appearance: Normal appearance. She is well-developed. She is not  ill-appearing, toxic-appearing or diaphoretic.  HENT:     Head: Normocephalic and atraumatic.     Right Ear: External ear normal.     Left Ear: External ear normal.     Nose: Nose normal.     Mouth/Throat:     Mouth: Mucous membranes are moist.  Eyes:     Extraocular Movements: Extraocular movements intact.     Conjunctiva/sclera: Conjunctivae normal.  Cardiovascular:     Rate and Rhythm: Normal rate and regular rhythm.  Pulmonary:     Effort: Pulmonary effort is normal. No respiratory distress.  Abdominal:     General: There is no distension.     Palpations: Abdomen is soft.  Musculoskeletal:        General: Swelling and tenderness present.     Cervical back: Normal range of motion and neck supple.  Skin:    General: Skin is warm and dry.     Capillary Refill: Capillary refill takes less than 2 seconds.  Neurological:     General: No focal deficit present.     Mental Status: She is alert and oriented to person, place, and time.  Psychiatric:        Mood and Affect: Mood normal.        Behavior: Behavior normal.     ED Results / Procedures / Treatments   Labs (all labs ordered are listed, but only abnormal results are displayed) Labs Reviewed  BASIC METABOLIC PANEL - Abnormal; Notable for the following components:      Result Value   Sodium 133 (*)    Chloride 97 (*)    Glucose, Bld 129 (*)    BUN 38 (*)    Creatinine, Ser 2.46 (*)    GFR, Estimated 19 (*)    All other components within normal limits  CBC WITH DIFFERENTIAL/PLATELET - Abnormal; Notable for the following components:   RBC 3.74 (*)    Hemoglobin 11.4 (*)    HCT 34.8 (*)    Platelets 128 (*)    Lymphs Abs 0.6 (*)    All other components within normal limits  GLUCOSE, CAPILLARY - Abnormal; Notable for the following components:   Glucose-Capillary 127 (*)    All other components within normal limits  BASIC METABOLIC PANEL  CBC    EKG None  Radiology DG Foot Complete Right  Result Date:  12/05/2022 CLINICAL DATA:  Bilateral foot pain.  Worse on the right. EXAM: RIGHT FOOT COMPLETE - 3+ VIEW; LEFT FOOT - COMPLETE 3+ VIEW COMPARISON:  None Available. FINDINGS: Left foot: There is no evidence of fracture or dislocation. Degenerative changes at the first metatarsophalangeal joint. Hallux valgus and hammertoe deformities. Plantar  calcaneal spurring and Achilles enthesopathy. Soft tissues are unremarkable. Right foot: There is no evidence of fracture or dislocation. Severe first metatarsophalangeal joint arthritis and hallux valgus deformity. Hammertoe deformities. Plantar calcaneal spurring and Achilles enthesopathy. Mild soft tissue swelling about the dorsum of the foot. IMPRESSION: LEFT FOOT: 1. No acute fracture or dislocation. 2. Hallux valgus and hammertoe deformities. 3. Mild first metatarsophalangeal joint arthritis. RIGHT FOOT: 1. No acute fracture or dislocation. 2. Severe first metatarsophalangeal joint arthritis and hallux valgus deformity and hammertoe deformities. Electronically Signed   By: Larose Hires D.O.   On: 12/05/2022 14:34   DG Foot Complete Left  Result Date: 12/05/2022 CLINICAL DATA:  Bilateral foot pain.  Worse on the right. EXAM: RIGHT FOOT COMPLETE - 3+ VIEW; LEFT FOOT - COMPLETE 3+ VIEW COMPARISON:  None Available. FINDINGS: Left foot: There is no evidence of fracture or dislocation. Degenerative changes at the first metatarsophalangeal joint. Hallux valgus and hammertoe deformities. Plantar calcaneal spurring and Achilles enthesopathy. Soft tissues are unremarkable. Right foot: There is no evidence of fracture or dislocation. Severe first metatarsophalangeal joint arthritis and hallux valgus deformity. Hammertoe deformities. Plantar calcaneal spurring and Achilles enthesopathy. Mild soft tissue swelling about the dorsum of the foot. IMPRESSION: LEFT FOOT: 1. No acute fracture or dislocation. 2. Hallux valgus and hammertoe deformities. 3. Mild first metatarsophalangeal  joint arthritis. RIGHT FOOT: 1. No acute fracture or dislocation. 2. Severe first metatarsophalangeal joint arthritis and hallux valgus deformity and hammertoe deformities. Electronically Signed   By: Larose Hires D.O.   On: 12/05/2022 14:34    Procedures Procedures    Medications Ordered in ED Medications  insulin aspart (novoLOG) injection 0-9 Units (has no administration in time range)  insulin aspart (novoLOG) injection 0-5 Units (has no administration in time range)  heparin injection 5,000 Units (has no administration in time range)  acetaminophen (TYLENOL) tablet 650 mg (has no administration in time range)    Or  acetaminophen (TYLENOL) suppository 650 mg (has no administration in time range)  0.9 %  sodium chloride infusion ( Intravenous New Bag/Given 12/05/22 1702)  polyethylene glycol (MIRALAX / GLYCOLAX) packet 17 g (has no administration in time range)  ondansetron (ZOFRAN) tablet 4 mg (has no administration in time range)    Or  ondansetron (ZOFRAN) injection 4 mg (has no administration in time range)  oxyCODONE-acetaminophen (PERCOCET/ROXICET) 5-325 MG per tablet 1 tablet (1 tablet Oral Given 12/05/22 1325)  predniSONE (DELTASONE) tablet 60 mg (60 mg Oral Given 12/05/22 1514)  lactated ringers bolus 1,000 mL (0 mLs Intravenous Stopped 12/05/22 1659)    ED Course/ Medical Decision Making/ A&P                             Medical Decision Making Amount and/or Complexity of Data Reviewed Labs: ordered. Radiology: ordered.  Risk Prescription drug management. Decision regarding hospitalization.   This patient presents to the ED for concern of foot pain, this involves an extensive number of treatment options, and is a complaint that carries with it a high risk of complications and morbidity.  The differential diagnosis includes gout, osteoarthritis, rheumatoid arthritis, plantar fasciitis, osteomyelitis   Co morbidities that complicate the patient evaluation  CAD, DM,  HTN, arthritis, GERD   Additional history obtained:  Additional history obtained from N/A External records from outside source obtained and reviewed including EMR   Lab Tests:  I Ordered, and personally interpreted labs.  The pertinent results include: AKI  is present.   Imaging Studies ordered:  I ordered imaging studies including x-ray of bilateral feet I independently visualized and interpreted imaging which showed no acute findings I agree with the radiologist interpretation   Cardiac Monitoring: / EKG:  The patient was maintained on a cardiac monitor.  I personally viewed and interpreted the cardiac monitored which showed an underlying rhythm of: Sinus rhythm   Problem List / ED Course / Critical interventions / Medication management  Patient presents for foot pain.  Pain is located in areas of right first MTP, right calcaneus, and left forefoot.  Pain has been ongoing over the past 3 weeks.  She was seen at outside hospital 3 weeks ago and prescribed 5 days of tramadol.  She has not been taking anything else for analgesia.  On arrival, vital signs are normal.  Patient is well-appearing on exam.  Left foot has redness and swelling to area first MTP.  This is suggestive of gout.  She has some mild tenderness to area of left forefoot.  There is no associated swelling or skin changes.  I suspect possible plantar fasciitis.  Percocet was ordered for analgesia in the ED.  Will check lab work to assess kidney function.  X-ray imaging showed chronic changes only.  Lab work showed AKI.  When speaking with the patient about this, she does state that she has had limited p.o. intake over the past several days due to her increased difficulty getting around her home.  Bolus of IV fluids was ordered.  Patient was started on prednisone for empiric treatment of gout.  She was admitted for further management. I ordered medication including IV fluids for AKI; prednisone for gout Reevaluation of the  patient after these medicines showed that the patient improved I have reviewed the patients home medicines and have made adjustments as needed   Social Determinants of Health:  Lives independently        Final Clinical Impression(s) / ED Diagnoses Final diagnoses:  AKI (acute kidney injury) (HCC)  Podagra    Rx / DC Orders ED Discharge Orders     None         Gloris Manchester, MD 12/05/22 1745

## 2022-12-05 NOTE — Assessment & Plan Note (Addendum)
Stable.  Questionable compliance with antihypertensives valsartan, metoprolol and Lasix.   - Will hold medications and monitor for now

## 2022-12-05 NOTE — ED Notes (Signed)
ED TO INPATIENT HANDOFF REPORT  ED Nurse Name and Phone #: Delice Bison, RN  S Name/Age/Gender [name] 88 y.o. female Room/Bed: APA08/APA08  Code Status   Code Status: Prior  Home/SNF/Other Home Patient oriented to: self, place, time, and situation Is this baseline? Yes   Triage Complete: Triage complete  Chief Complaint AKI (acute kidney injury) [facility]) [N17.9]  Triage Note [facility] reports pt coming from home. Pt c/o bilateral foot pain. Pt thinks she has gout. Pain for the past 2-3 weeks with increased difficulty walking the past few days.   Allergies Allergies  Allergen Reactions   Other Itching    Chest monitor adhesives    Codeine Nausea Only   Milk-Related Compounds Diarrhea    Level of Care/Admitting Diagnosis ED Disposition     ED Disposition  Admit   Condition  --   Comment  Hospital Area: [facility] [[id number]]  Level of Care: Med-Surg [16]  Covid Evaluation: Asymptomatic - no recent exposure (last 10 days) testing not required  Diagnosis: AKI (acute kidney injury) [facility]) [[id number]]  Admitting Physician: Onnie Boer [[id number]]  Attending Physician: Onnie Boer Xenia.Douglas          B Medical/Surgery History Past Medical History:  Diagnosis Date   CAD (coronary artery disease)    a. s/p NSTEMI in 07/2021 with cath showing minimal CAD and no culprit lesion identified   Constipation    Cramps, extremity    legs and hands   Diabetes (HCC)    type II   Enterococcus UTI 02/03/2016   H/O cardiovascular stress test 03/05/2009   normal, no ischemia   H/O Doppler ultrasound    lower ext arterial doppler 04/13/09-no evidence of arterial insufficiency, normal values; distal aorta 2.4x2.7   HTN (hypertension)    Murmur, cardiac    echo 02/25/13- EF 60-65%, impaired relaxation-grade 1 diastolic dysfunction, mild concentric lvh, mild to moderate aortic regurgitation   Myocardial infarction [facility])    january   Pancreatic cyst     Pneumonia    May 2017   Postoperative anemia due to acute blood loss 02/02/2016   Primary localized osteoarthritis of right knee    Recurrent upper respiratory infection (URI)    TIA (transient ischemic attack)    carotid doppler 05/08/11-normal patency   Past Surgical History:  Procedure Laterality Date   ABDOMINAL HYSTERECTOMY     BIOPSY  03/31/2022   Procedure: BIOPSY;  Surgeon: Lemar Lofty., MD;  Location: [facility];  Service: Gastroenterology;;   BUNIONECTOMY Left    COLONOSCOPY  04/29/2010   Dr. Matthias Hughs; diverticulosis in the entire colon, otherwise normal exam. Recommended repeat colonoscopy in 5 years for surveillance due to personal history of polyps.   ESOPHAGOGASTRODUODENOSCOPY (EGD) WITH PROPOFOL N/A 03/31/2022   Procedure: ESOPHAGOGASTRODUODENOSCOPY (EGD) WITH PROPOFOL;  Surgeon: Meridee Score Netty Starring., MD;  Location: [facility];  Service: Gastroenterology;  Laterality: N/A;   EUS N/A 03/31/2022   Procedure: UPPER ENDOSCOPIC ULTRASOUND (EUS) LINEAR;  Surgeon: Lemar Lofty., MD;  Location: [facility];  Service: Gastroenterology;  Laterality: N/A;   FINE NEEDLE ASPIRATION  03/31/2022   Procedure: FINE NEEDLE ASPIRATION (FNA) LINEAR;  Surgeon: Lemar Lofty., MD;  Location: [facility];  Service: Gastroenterology;;   Gaspar Bidding DILATION N/A 03/31/2022   Procedure: Gaspar Bidding DILATION;  Surgeon: Lemar Lofty., MD;  Location: [facility];  Service: Gastroenterology;  Laterality: N/A;   TOTAL KNEE ARTHROPLASTY Right 02/01/2016   Procedure: TOTAL KNEE ARTHROPLASTY;  Surgeon: Salvatore Marvel, MD;  Location: [facility] OR;  Service: Orthopedics;  Laterality: Right;     A IV Location/Drains/Wounds Patient Lines/Drains/Airways Status     Active Line/Drains/Airways     Name Placement date Placement time Site Days   Peripheral IV 12/05/22 20 G Anterior;Right Forearm 12/05/22  1511  Forearm  less than 1            Intake/Output Last 24  hours No intake or output data in the 24 hours ending 12/05/22 1613  Labs/Imaging Results for orders placed or performed during the hospital encounter of 12/05/22 (from the past 48 hour(s))  Basic metabolic panel     Status: Abnormal   Collection Time: 12/05/22  2:09 PM  Result Value Ref Range   Sodium 133 (L) 135 - 145 mmol/L   Potassium 3.5 3.5 - 5.1 mmol/L   Chloride 97 (L) 98 - 111 mmol/L   CO2 22 22 - 32 mmol/L   Glucose, Bld 129 (H) 70 - 99 mg/dL    Comment: Glucose reference range applies only to samples taken after fasting for at least 8 hours.   BUN 38 (H) 8 - 23 mg/dL   Creatinine, Ser 1.61 (H) 0.44 - 1.00 mg/dL   Calcium 9.0 8.9 - 09.6 mg/dL   GFR, Estimated 19 (L) >60 mL/min    Comment: (NOTE) Calculated using the CKD-EPI Creatinine Equation (2021)    Anion gap 14 5 - 15    Comment: Performed at [facility], [address], [place], [place] [address]  CBC with Differential     Status: Abnormal   Collection Time: 12/05/22  2:09 PM  Result Value Ref Range   WBC 8.1 4.0 - 10.5 K/uL   RBC 3.74 (L) 3.87 - 5.11 MIL/uL   Hemoglobin 11.4 (L) 12.0 - 15.0 g/dL   HCT 98.1 (L) 19.1 - 47.8 %   MCV 93.0 80.0 - 100.0 fL   MCH 30.5 26.0 - 34.0 pg   MCHC 32.8 30.0 - 36.0 g/dL   RDW 29.5 62.1 - 30.8 %   Platelets 128 (L) 150 - 400 K/uL   nRBC 0.0 0.0 - 0.2 %   Neutrophils Relative % 80 %   Neutro Abs 6.5 1.7 - 7.7 K/uL   Lymphocytes Relative 8 %   Lymphs Abs 0.6 (L) 0.7 - 4.0 K/uL   Monocytes Relative 11 %   Monocytes Absolute 0.9 0.1 - 1.0 K/uL   Eosinophils Relative 0 %   Eosinophils Absolute 0.0 0.0 - 0.5 K/uL   Basophils Relative 1 %   Basophils Absolute 0.1 0.0 - 0.1 K/uL   Immature Granulocytes 0 %   Abs Immature Granulocytes 0.02 0.00 - 0.07 K/uL    Comment: Performed at [facility], [address], [place], [place] [address]   DG Foot Complete Right  Result Date: 12/05/2022 CLINICAL DATA:  Bilateral foot pain.  Worse on the right. EXAM: RIGHT FOOT COMPLETE - 3+  VIEW; LEFT FOOT - COMPLETE 3+ VIEW COMPARISON:  None Available. FINDINGS: Left foot: There is no evidence of fracture or dislocation. Degenerative changes at the first metatarsophalangeal joint. Hallux valgus and hammertoe deformities. Plantar calcaneal spurring and Achilles enthesopathy. Soft tissues are unremarkable. Right foot: There is no evidence of fracture or dislocation. Severe first metatarsophalangeal joint arthritis and hallux valgus deformity. Hammertoe deformities. Plantar calcaneal spurring and Achilles enthesopathy. Mild soft tissue swelling about the dorsum of the foot. IMPRESSION: LEFT FOOT: 1. No acute fracture or dislocation. 2. Hallux valgus and hammertoe deformities. 3. Mild first  metatarsophalangeal joint arthritis. RIGHT FOOT: 1. No acute fracture or dislocation. 2. Severe first metatarsophalangeal joint arthritis and hallux valgus deformity and hammertoe deformities. Electronically Signed   By: Larose Hires D.O.   On: 12/05/2022 14:34   DG Foot Complete Left  Result Date: 12/05/2022 CLINICAL DATA:  Bilateral foot pain.  Worse on the right. EXAM: RIGHT FOOT COMPLETE - 3+ VIEW; LEFT FOOT - COMPLETE 3+ VIEW COMPARISON:  None Available. FINDINGS: Left foot: There is no evidence of fracture or dislocation. Degenerative changes at the first metatarsophalangeal joint. Hallux valgus and hammertoe deformities. Plantar calcaneal spurring and Achilles enthesopathy. Soft tissues are unremarkable. Right foot: There is no evidence of fracture or dislocation. Severe first metatarsophalangeal joint arthritis and hallux valgus deformity. Hammertoe deformities. Plantar calcaneal spurring and Achilles enthesopathy. Mild soft tissue swelling about the dorsum of the foot. IMPRESSION: LEFT FOOT: 1. No acute fracture or dislocation. 2. Hallux valgus and hammertoe deformities. 3. Mild first metatarsophalangeal joint arthritis. RIGHT FOOT: 1. No acute fracture or dislocation. 2. Severe first metatarsophalangeal  joint arthritis and hallux valgus deformity and hammertoe deformities. Electronically Signed   By: Larose Hires D.O.   On: 12/05/2022 14:34    Pending Labs Unresulted Labs (From admission, onward)    None       Vitals/Pain Today's Vitals   12/05/22 1157 12/05/22 1230 12/05/22 1500 12/05/22 1526  BP: 108/69 122/73 130/63   Pulse: 81 73 (!) 58   Resp: 18 18 18    Temp: 98.1 F (36.7 C)   97.8 F (36.6 C)  TempSrc: Oral   Oral  SpO2: 97% 93% 96%   PainSc:        Isolation Precautions No active isolations  Medications Medications  oxyCODONE-acetaminophen (PERCOCET/ROXICET) 5-325 MG per tablet 1 tablet (1 tablet Oral Given 12/05/22 1325)  predniSONE (DELTASONE) tablet 60 mg (60 mg Oral Given 12/05/22 1514)  lactated ringers bolus 1,000 mL (1,000 mLs Intravenous New Bag/Given 12/05/22 1511)    Mobility walks     Focused Assessments Cardiac Assessment Handoff:    No results found for: "CKTOTAL", "CKMB", "CKMBINDEX", "TROPONINI" No results found for: "DDIMER" Does the Patient currently have chest pain? No    R Recommendations: See Admitting Provider Note  Report given to:   Additional Notes:

## 2022-12-06 DIAGNOSIS — M109 Gout, unspecified: Secondary | ICD-10-CM

## 2022-12-06 DIAGNOSIS — M79671 Pain in right foot: Secondary | ICD-10-CM | POA: Diagnosis not present

## 2022-12-06 DIAGNOSIS — E1322 Other specified diabetes mellitus with diabetic chronic kidney disease: Secondary | ICD-10-CM | POA: Diagnosis not present

## 2022-12-06 DIAGNOSIS — N184 Chronic kidney disease, stage 4 (severe): Secondary | ICD-10-CM | POA: Diagnosis not present

## 2022-12-06 DIAGNOSIS — I10 Essential (primary) hypertension: Secondary | ICD-10-CM | POA: Diagnosis not present

## 2022-12-06 DIAGNOSIS — N179 Acute kidney failure, unspecified: Secondary | ICD-10-CM | POA: Diagnosis not present

## 2022-12-06 LAB — BASIC METABOLIC PANEL
Anion gap: 13 (ref 5–15)
BUN: 42 mg/dL — ABNORMAL HIGH (ref 8–23)
CO2: 19 mmol/L — ABNORMAL LOW (ref 22–32)
Calcium: 8.5 mg/dL — ABNORMAL LOW (ref 8.9–10.3)
Chloride: 101 mmol/L (ref 98–111)
Creatinine, Ser: 2.26 mg/dL — ABNORMAL HIGH (ref 0.44–1.00)
GFR, Estimated: 21 mL/min — ABNORMAL LOW (ref >60)
Glucose, Bld: 199 mg/dL — ABNORMAL HIGH (ref 70–99)
Potassium: 4.3 mmol/L (ref 3.5–5.1)
Sodium: 133 mmol/L — ABNORMAL LOW (ref 135–145)

## 2022-12-06 LAB — GLUCOSE, CAPILLARY
Glucose-Capillary: 173 mg/dL — ABNORMAL HIGH (ref 70–99)
Glucose-Capillary: 194 mg/dL — ABNORMAL HIGH (ref 70–99)
Glucose-Capillary: 272 mg/dL — ABNORMAL HIGH (ref 70–99)
Glucose-Capillary: 106 mg/dL — ABNORMAL HIGH (ref 70–99)

## 2022-12-06 LAB — CBC
HCT: 33.4 % — ABNORMAL LOW (ref 36.0–46.0)
Hemoglobin: 11 g/dL — ABNORMAL LOW (ref 12.0–15.0)
MCH: 30.6 pg (ref 26.0–34.0)
MCHC: 32.9 g/dL (ref 30.0–36.0)
MCV: 93 fL (ref 80.0–100.0)
Platelets: 124 10*3/uL — ABNORMAL LOW (ref 150–400)
RBC: 3.59 MIL/uL — ABNORMAL LOW (ref 3.87–5.11)
RDW: 11.9 % (ref 11.5–15.5)
WBC: 5.3 10*3/uL (ref 4.0–10.5)
nRBC: 0 % (ref 0.0–0.2)

## 2022-12-06 LAB — URIC ACID: Uric Acid, Serum: 8.5 mg/dL — ABNORMAL HIGH (ref 2.5–7.1)

## 2022-12-06 LAB — HEMOGLOBIN A1C
Hgb A1c MFr Bld: 7.5 % — ABNORMAL HIGH (ref 4.8–5.6)
Mean Plasma Glucose: 169 mg/dL

## 2022-12-06 MED ORDER — ALLOPURINOL 100 MG PO TABS
100.0000 mg | ORAL_TABLET | Freq: Two times a day (BID) | ORAL | Status: DC
  Administered 2022-12-06 – 2022-12-08 (×5): 100 mg via ORAL
  Filled 2022-12-06 (×5): qty 1

## 2022-12-06 MED ORDER — METOPROLOL TARTRATE 25 MG PO TABS
12.5000 mg | ORAL_TABLET | Freq: Two times a day (BID) | ORAL | Status: DC
  Administered 2022-12-06 – 2022-12-08 (×4): 12.5 mg via ORAL
  Filled 2022-12-06 (×4): qty 1

## 2022-12-06 MED ORDER — DICLOFENAC SODIUM 1 % EX GEL
2.0000 g | Freq: Three times a day (TID) | CUTANEOUS | Status: DC | PRN
  Administered 2022-12-07: 2 g via TOPICAL
  Filled 2022-12-06 (×2): qty 100

## 2022-12-06 MED ORDER — SIMETHICONE 80 MG PO CHEW
80.0000 mg | CHEWABLE_TABLET | Freq: Four times a day (QID) | ORAL | Status: DC | PRN
  Administered 2022-12-06: 80 mg via ORAL
  Filled 2022-12-06: qty 1

## 2022-12-06 NOTE — Discharge Instructions (Signed)
  Providers Accepting New Patients in [place], [place]    [facility] [address]  [place], [place] [address] [phone] Accepts most insurances  [facility] [address] [place], [place] [address] [phone] Accepts most insurances  [facility] [address] [place], [place] [address]  [phone] Must meet requirements  [facility] [address] [place], [place] [address] [phone] Accepts most insurances  [facility] [address]  [place], [place] [address] [phone] Accepts most insurances  [facility] [address]   [place], [place]   [phone] Accepts most insurances  [facility])  [phone] [address]  [place], [place] [address] [phone] Accepts most insurances     [facility] [address]  [place], [place] [address] [phone] Accepts most insurances  [facility] Department [address] [place], [place] [address] [phone] option 1 Accepts Medicaid and [facility] [address]  [place], [place] [address] [phone] Accepts most insurances  [place]on Gully, MD [address] [place], [place] [address] [phone] Accepts most insurances  [facility] at [facility] [address]  [place], [place] [address] [phone] Accepts most insurances  [facility] [phone] [address] [place], [place] [address] [phone] Accepts most insurances  [place] [address] [place], [place] [address] [phone]  Accepts most insurances      1)Avoid ibuprofen/Advil/Aleve/Motrin/Goody Powders/Naproxen/BC powders/Meloxicam/Diclofenac/Indomethacin and other Nonsteroidal anti-inflammatory medications as these will make you more likely to bleed and can cause stomach ulcers, can also cause Kidney  problems.   2)Repeat BMP blood test in 5 to 7 days advised  3)Stop Lasix 40 mg daily, reduce Lasix to 20 mg daily starting 12/10/2022  4) take prednisone and allopurinol as prescribed for gout

## 2022-12-06 NOTE — Evaluation (Signed)
Physical Therapy Evaluation Patient Details Name: [name] MRN: [id number] DOB: [birth year] Today's Date: 12/06/2022  History of Present Illness  [name] is a 88 y.o. female with medical history significant for diabetes mellitus, hypertension, coronary artery disease.  Patient presented to the ED with complaints of bilateral foot pain over the past 2 to 3 weeks.  It is worse on the right foot with weightbearing making it difficulty to walk, and involving the 1st  MTP, and calcaneus.  She has some redness to the first MTP joint but she reports this is mostly chronic.  She denies vomiting, no loose stools and has maintained okay oral intake.  No Fevers no chills.   Clinical Impression  Patient able to sit up at bedside moving BLE without problem, unsteady having to lean on armrest of chair during transfer without AD, safer using RW and demonstrated good return for ambulating in room/hallway without loss of balance.  Patient tolerated sitting up in chair after therapy.  Patient will benefit from continued skilled physical therapy in hospital and recommended venue below to increase strength, balance, endurance for safe ADLs and gait.         Recommendations for follow up therapy are one component of a multi-disciplinary discharge planning process, led by the attending physician.  Recommendations may be updated based on patient status, additional functional criteria and insurance authorization.  Follow Up Recommendations       Assistance Recommended at Discharge Set up Supervision/Assistance  Patient can return home with the following  A little help with walking and/or transfers;A little help with bathing/dressing/bathroom;Help with stairs or ramp for entrance;Assistance with cooking/housework    Equipment Recommendations None recommended by PT  Recommendations for Other Services       Functional Status Assessment Patient has had a recent decline in their functional status  and demonstrates the ability to make significant improvements in function in a reasonable and predictable amount of time.     Precautions / Restrictions Precautions Precautions: Fall Restrictions Weight Bearing Restrictions: No      Mobility  Bed Mobility Overal bed mobility: Modified Independent                  Transfers Overall transfer level: Needs assistance Equipment used: Rolling walker (2 wheels) Transfers: Sit to/from Stand, Bed to chair/wheelchair/BSC Sit to Stand: Supervision   Step pivot transfers: Supervision       General transfer comment: good return for transfers using RW    Ambulation/Gait Ambulation/Gait assistance: Supervision Gait Distance (Feet): 85 Feet Assistive device: Rolling walker (2 wheels) Gait Pattern/deviations: Decreased step length - right, Decreased step length - left, Decreased stride length Gait velocity: decreased     General Gait Details: slightly labored cadence without loss of balance with mild/moderate increase in right foot pain when weightbearing  Stairs            Wheelchair Mobility    Modified Rankin (Stroke Patients Only)       Balance Overall balance assessment: Needs assistance Sitting-balance support: Feet supported, No upper extremity supported Sitting balance-Leahy Scale: Good Sitting balance - Comments: seated at EOB   Standing balance support: During functional activity, No upper extremity supported Standing balance-Leahy Scale: Poor Standing balance comment: fair/good using RW                             Pertinent Vitals/Pain Pain Assessment Pain Assessment: 0-10 Pain Score: 6  Pain Location:  right foot mostly medial side Pain Descriptors / Indicators: Sore, Guarding, Sharp Pain Intervention(s): Limited activity within patient's tolerance, Monitored during session, Repositioned    Home Living Family/patient expects to be discharged to:: Private residence Living  Arrangements: Alone Available Help at Discharge: Family;Available PRN/intermittently Type of Home: House Home Access: Stairs to enter Entrance Stairs-Rails: Right;Left;Can reach both Entrance Stairs-Number of Steps: 3   Home Layout: One level Home Equipment: Agricultural consultant (2 wheels);Cane - single point      Prior Function Prior Level of Function : Independent/Modified Independent;Driving             Mobility Comments: Tourist information centre manager, drives ADLs Comments: Independent     Hand Dominance        Extremity/Trunk Assessment   Upper Extremity Assessment Upper Extremity Assessment: Overall WFL for tasks assessed    Lower Extremity Assessment Lower Extremity Assessment: Generalized weakness    Cervical / Trunk Assessment Cervical / Trunk Assessment: Normal  Communication   Communication: No difficulties  Cognition Arousal/Alertness: Awake/alert Behavior During Therapy: WFL for tasks assessed/performed Overall Cognitive Status: Within Functional Limits for tasks assessed                                          General Comments      Exercises     Assessment/Plan    PT Assessment Patient needs continued PT services  PT Problem List Decreased strength;Decreased activity tolerance;Decreased balance;Decreased mobility       PT Treatment Interventions DME instruction;Gait training;Stair training;Functional mobility training;Therapeutic activities;Therapeutic exercise;Patient/family education;Balance training    PT Goals (Current goals can be found in the Care Plan section)  Acute Rehab PT Goals Patient Stated Goal: return home with family to assist PT Goal Formulation: With patient Time For Goal Achievement: 12/09/22 Potential to Achieve Goals: Good    Frequency Min 3X/week     Co-evaluation               AM-PAC PT "6 Clicks" Mobility  Outcome Measure Help needed turning from your back to your side while in a flat bed without  using bedrails?: None Help needed moving from lying on your back to sitting on the side of a flat bed without using bedrails?: None Help needed moving to and from a bed to a chair (including a wheelchair)?: A Little Help needed standing up from a chair using your arms (e.g., wheelchair or bedside chair)?: A Little Help needed to walk in hospital room?: A Little Help needed climbing 3-5 steps with a railing? : A Lot 6 Click Score: 19    End of Session   Activity Tolerance: Patient tolerated treatment well;Patient limited by fatigue Patient left: in chair;with call bell/phone within reach Nurse Communication: Mobility status PT Visit Diagnosis: Unsteadiness on feet (R26.81);Other abnormalities of gait and mobility (R26.89);Muscle weakness (generalized) (M62.81)    Time: 4098-1191 PT Time Calculation (min) (ACUTE ONLY): 24 min   Charges:   PT Evaluation $PT Eval Moderate Complexity: 1 Mod PT Treatments $Therapeutic Activity: 23-37 mins        3:31 PM, 12/06/22 Ocie Bob, MPT Physical Therapist with [facility] [phone] office [phone] mobile phone

## 2022-12-06 NOTE — TOC Initial Note (Signed)
Transition of Care [facility]) - Initial/Assessment Note    Patient Details  Name: [name] MRN: [id number] Date of Birth: July 03, 1935  Transition of Care [facility]) CM/SW Contact:    Elliot Gault, LCSW Phone Number: 12/06/2022, 3:29 PM  Clinical Narrative:                  Pt admitted from home. PT recommending HH PT at dc. MD anticipating dc tomorrow.  Met with pt at bedside to assess and review dc planning. Per pt, she is independent in ADLs at home. She drives and is able to get to appointments and obtain medications as needed. Pt reports that her PCP is Dr. Sherril Croon though she is looking into changing to [facility].  Discussed HHPT recommendation and pt agreeable. CMS [facility] Provider options reviewed and referral made as requested. Cory at [place] accepts the referral. Information added to pt's AVS.  Will follow.    Expected Discharge Plan: Home w Home Health Services Barriers to Discharge: Continued Medical Work up   Patient Goals and CMS Choice Patient states their goals for this hospitalization and ongoing recovery are:: return home CMS Medicare.gov Compare Post Acute Care list provided to:: Patient Choice offered to / list presented to : Patient      Expected Discharge Plan and Services In-house Referral: Clinical Social Work   Post Acute Care Choice: Home Health Living arrangements for the past 2 months: Single Family Home                           HH Arranged: PT HH Agency: [facility] Date [facility] Agency Contacted: 12/06/22   Representative spoke with at [facility] Agency: Kandee Keen  Prior Living Arrangements/Services Living arrangements for the past 2 months: Single Family Home Lives with:: Self Patient language and need for interpreter reviewed:: Yes Do you feel safe going back to the place where you live?: Yes      Need for Family Participation in Patient Care: No (Comment)     Criminal Activity/Legal Involvement Pertinent to Current  Situation/Hospitalization: No - Comment as needed  Activities of Daily Living Home Assistive Devices/Equipment: Cane (specify quad or straight), Eyeglasses, Walker (specify type) ADL Screening (condition at time of admission) Patient's cognitive ability adequate to safely complete daily activities?: Yes Is the patient deaf or have difficulty hearing?: Yes Does the patient have difficulty seeing, even when wearing glasses/contacts?: No Does the patient have difficulty concentrating, remembering, or making decisions?: No Patient able to express need for assistance with ADLs?: Yes Does the patient have difficulty dressing or bathing?: No Independently performs ADLs?: Yes (appropriate for developmental age) Does the patient have difficulty walking or climbing stairs?: Yes Weakness of Legs: Both Weakness of Arms/Hands: None  Permission Sought/Granted Permission sought to share information with : Oceanographer granted to share information with : Yes, Verbal Permission Granted     Permission granted to share info w AGENCY: HH        Emotional Assessment Appearance:: Appears younger than stated age Attitude/Demeanor/Rapport: Engaged Affect (typically observed): Pleasant Orientation: : Oriented to Self, Oriented to Place, Oriented to  Time, Oriented to Situation Alcohol / Substance Use: Not Applicable Psych Involvement: No (comment)  Admission diagnosis:  AKI (acute kidney injury) (HCC) [N17.9] Patient Active Problem List   Diagnosis Date Noted   AKI (acute kidney injury) (HCC) 12/05/2022   Pain in right foot 12/05/2022   CKD (chronic kidney disease) stage  4, GFR 15-29 ml/min (HCC) 12/05/2022   Gastroesophageal reflux disease 10/12/2022   UIP (usual interstitial pneumonitis) (HCC) 09/01/2022   Thyroid nodule incidentally noted on imaging study 09/01/2022   Loss of weight 01/14/2022   Dysphagia 01/13/2022   Chronic constipation 01/13/2022   Pancreatic  lesion 01/13/2022   Chronic cough 01/24/2020   DOE (dyspnea on exertion) 01/24/2020   Enterococcus UTI 02/03/2016   Postoperative anemia due to acute blood loss 02/02/2016   Primary localized osteoarthritis of right knee    Diabetes (HCC)    HTN (hypertension)    Murmur, cardiac    TIA (transient ischemic attack)    AI (aortic insufficiency) 12/26/2014   H/O cardiovascular stress test 03/05/2009   PCP:  Pcp, No Pharmacy:   [facility] - [place], [place] - [address] [place] [address] Phone: [phone] Fax: [phone]     Social Determinants of Health (SDOH) Social History: SDOH Screenings   Food Insecurity: No Food Insecurity (12/05/2022)  Housing: Low Risk  (12/05/2022)  Transportation Needs: No Transportation Needs (12/05/2022)  Utilities: Not At Risk (12/05/2022)  Depression (PHQ2-9): High Risk (12/20/2021)  Tobacco Use: Low Risk  (12/05/2022)   SDOH Interventions:     Readmission Risk Interventions     No data to display

## 2022-12-06 NOTE — Plan of Care (Signed)
  Problem: Acute Rehab PT Goals(only PT should resolve) Goal: Pt Will Go Supine/Side To Sit Outcome: Progressing Flowsheets (Taken 12/06/2022 1533) Pt will go Supine/Side to Sit:  Independently  with modified independence Goal: Patient Will Transfer Sit To/From Stand Outcome: Progressing Flowsheets (Taken 12/06/2022 1533) Patient will transfer sit to/from stand: with modified independence Goal: Pt Will Transfer Bed To Chair/Chair To Bed Outcome: Progressing Flowsheets (Taken 12/06/2022 1533) Pt will Transfer Bed to Chair/Chair to Bed: with modified independence Goal: Pt Will Ambulate Outcome: Progressing Flowsheets (Taken 12/06/2022 1533) Pt will Ambulate:  > 125 feet  with moderate assist  with rolling walker   3:34 PM, 12/06/22 Ocie Bob, MPT Physical Therapist with [facility] [phone] office [phone] mobile phone

## 2022-12-06 NOTE — Progress Notes (Signed)
Progress Note   Patient: [name] ZOX:[id number] DOB: [birth year] DOA: 12/05/2022     0 DOS: the patient was seen and examined on 12/06/2022   Brief hospital admission narrative: As per H&P written by Dr. Mariea Clonts on 12/05/2022 ASHELYNN VANBRAMER is a 88 y.o. female with medical history significant for diabetes mellitus, hypertension, coronary artery disease.  Patient presented to the ED with complaints of bilateral foot pain over the past 2 to 3 weeks.  It is worse on the right foot with weightbearing making it difficulty to walk, and involving the 1st  MTP, and calcaneus.  She has some redness to the first MTP joint but she reports this is mostly chronic. She denies vomiting, no loose stools and has maintained okay oral intake.  No Fevers no chills.   ED Course: Temperature 98.1.  Heart rate 58-81.  Respirate rate 18.  Blood pressure systolic 108-149.  X-ray of the left and right foot negative for acute abnormality, shows severe first MTP joint arthritis  hallux valgus deformity and hammertoe deformities. Creatinine elevated at 2.46. 1 L LR bolus given, 60 mg prednisone given for possible gout.  Assessment and Plan: * AKI (acute kidney injury) (HCC) -Continue holding nephrotoxic agents -Creatinine 2.2 after fluid resuscitation -Continue to follow renal function trend.  Pain in right foot -Mostly first MTP, which shows significant valgus abnormality, also pain to the calcaneus area.   -Patient reports porting prior history of gout and pain, swelling, erythema and severity resembling prior experience but more worse. -Patient also having complaints on her left foot but milder.   -Continue treatment with steroids, starting allopurinol and using Voltaren gel.   -Physical therapy has examined patient with recommendation for home health PT at discharge -Follow clinical response.   -Continue the use of Tylenol as needed to assist with pain.  -No NSAIDs in the setting of stage IV renal  failure.  CKD (chronic kidney disease) stage 4, GFR 15-29 ml/min (HCC) -Creatinine at baseline 1.7-1.8 -Continue minimizing nephrotoxic agents, contrast and hypotension -Continue to maintain adequate hydration and follow renal function trend. -Patient's creatinine down to 2.2. -Continue supportive care.  HTN (hypertension) -Blood pressure stable for the most part, slightly rising. -Continue home pain nephrotoxic agents -Will resume adjusted dose of metoprolol. -Follow-up vital signs. -Heart healthy diet discussed with patient.  Diabetes (HCC) -Continue to hold oral hypoglycemic agent -Follow A1c results -Continue sliding scale insulin -Patient advised to follow modified carbohydrate diet.   Subjective:  No fever, no chest pain, no nausea or vomiting.  Reports still ongoing pain in her feet bilaterally (right more than left).  Expressed increased urine output.  Physical Exam: Vitals:   12/05/22 2045 12/06/22 0103 12/06/22 0437 12/06/22 1307  BP: 132/67 (!) 110/58 129/66 (!) 136/54  Pulse: 62 (!) 56 61 64  Resp: 18 18 18 18   Temp:  98 F (36.7 C) 97.7 F (36.5 C) 97.8 F (36.6 C)  TempSrc:  Oral Oral Oral  SpO2: 100% 94% 93% 96%  Weight:      Height:       General exam: Alert, awake, oriented x 3; in no acute distress; reporting pain in her feet bilaterally and poor balance. Respiratory system: Clear to auscultation. Respiratory effort normal. Cardiovascular system:RRR. No rubs or gallops. Gastrointestinal system: Abdomen is nondistended, soft and nontender. No organomegaly or masses felt. Normal bowel sounds heard. Central nervous system: No focal neurological deficits. Extremities: No cyanosis or clubbing; valgus deformation affecting first metatarsals in her feet (  right more than left); erythematous changes and more swelling seen in her right foot.  Patient expressed tenderness with palpation. Skin: No petechiae. Psychiatry: Judgement and insight appear normal. Mood &  affect appropriate.    Data Reviewed: Basic metabolic panel: Sodium 133, potassium 4.3, chloride 101, bicarb 19, glucose 199, BUN 42, creatinine 2.26 and GFR 21 CBC: White blood cell 5.3, hemoglobin 11.0 and platelet count 124 Uric acid: Pending   Family Communication: Friend at bedside.  Disposition: Status is: Observation The patient remains OBS appropriate and will d/c before 2 midnights.   Planned Discharge Destination: Home with Home Health   Time spent: 35 minutes  Author: Vassie Loll, MD 12/06/2022 2:01 PM  For on call review [email or web address].

## 2022-12-06 NOTE — Assessment & Plan Note (Addendum)
-  Creatinine at baseline 1.7-1.8 -Continue minimizing nephrotoxic agents, contrast and hypotension -Continue to maintain adequate hydration and follow renal function trend. -Patient's creatinine down to 2.2. -Continue supportive care.

## 2022-12-07 ENCOUNTER — Other Ambulatory Visit: Payer: Self-pay

## 2022-12-07 DIAGNOSIS — I10 Essential (primary) hypertension: Secondary | ICD-10-CM

## 2022-12-07 DIAGNOSIS — R413 Other amnesia: Secondary | ICD-10-CM

## 2022-12-07 DIAGNOSIS — I351 Nonrheumatic aortic (valve) insufficiency: Secondary | ICD-10-CM

## 2022-12-07 DIAGNOSIS — R0609 Other forms of dyspnea: Secondary | ICD-10-CM

## 2022-12-07 DIAGNOSIS — G459 Transient cerebral ischemic attack, unspecified: Secondary | ICD-10-CM

## 2022-12-07 DIAGNOSIS — N179 Acute kidney failure, unspecified: Secondary | ICD-10-CM | POA: Diagnosis not present

## 2022-12-07 LAB — GLUCOSE, CAPILLARY
Glucose-Capillary: 156 mg/dL — ABNORMAL HIGH (ref 70–99)
Glucose-Capillary: 148 mg/dL — ABNORMAL HIGH (ref 70–99)
Glucose-Capillary: 151 mg/dL — ABNORMAL HIGH (ref 70–99)
Glucose-Capillary: 269 mg/dL — ABNORMAL HIGH (ref 70–99)
Glucose-Capillary: 163 mg/dL — ABNORMAL HIGH (ref 70–99)

## 2022-12-07 LAB — BASIC METABOLIC PANEL
Anion gap: 12 (ref 5–15)
BUN: 47 mg/dL — ABNORMAL HIGH (ref 8–23)
CO2: 19 mmol/L — ABNORMAL LOW (ref 22–32)
Calcium: 8.6 mg/dL — ABNORMAL LOW (ref 8.9–10.3)
Chloride: 102 mmol/L (ref 98–111)
Creatinine, Ser: 2 mg/dL — ABNORMAL HIGH (ref 0.44–1.00)
GFR, Estimated: 24 mL/min — ABNORMAL LOW (ref >60)
Glucose, Bld: 200 mg/dL — ABNORMAL HIGH (ref 70–99)
Potassium: 4.4 mmol/L (ref 3.5–5.1)
Sodium: 133 mmol/L — ABNORMAL LOW (ref 135–145)

## 2022-12-07 MED ORDER — SODIUM CHLORIDE 0.9 % IV SOLN: INTRAVENOUS | Status: AC

## 2022-12-07 NOTE — Progress Notes (Signed)
  Progress Note   Patient: [name] ZOX:[id number] DOB: Jul 22, 1934 DOA: 12/05/2022     0 DOS: the patient was seen and examined on 12/07/2022   Brief hospital admission narrative: As per H&P written by Dr. Mariea Clonts on 12/05/2022 AMBREIA HOGANCAMP is a 88 y.o. female with medical history significant for diabetes mellitus, hypertension, coronary artery disease.  Patient presented to the ED with complaints of bilateral foot pain over the past 2 to 3 weeks.  It is worse on the right foot with weightbearing making it difficulty to walk, and involving the 1st  MTP, and calcaneus.  She has some redness to the first MTP joint but she reports this is mostly chronic. She denies vomiting, no loose stools and has maintained okay oral intake.  No Fevers no chills.   ED Course: Temperature 98.1.  Heart rate 58-81.  Respirate rate 18.  Blood pressure systolic 108-149.  X-ray of the left and right foot negative for acute abnormality, shows severe first MTP joint arthritis  hallux valgus deformity and hammertoe deformities. Creatinine elevated at 2.46. 1 L LR bolus given, 60 mg prednisone given for possible gout.  Assessment and Plan: * AKI on CKD 4-due to dehydration/poor oral intake -Creatinine trending down with IV fluids -- renally adjust medications, avoid nephrotoxic agents / dehydration  / hypotension  Pain in right foot--gout suspected -Mostly first MTP, which shows significant valgus abnormality, also pain to the calcaneus area.   -Patient reports porting prior history of gout and pain, swelling, erythema and severity resembling prior experience but more worse. -Elevated uric acid levels noted -Continue allopurinol and using Voltaren gel.   -Physical therapy has examined patient with recommendation for home health PT at discharge -Prednisone as ordered -No NSAIDs in the setting of stage IV renal failure.  HTN (hypertension) -Blood pressure stable for the most part, slightly  rising. -Continue home pain nephrotoxic agents -c/n adjusted dose of metoprolol. -Follow-up vital signs. -Heart healthy diet discussed with patient.  DM2 --A1c 7.4 reflecting uncontrolled diabetes with hyperglycemia PTA Use Novolog/Humalog Sliding scale insulin with Accu-Cheks/Fingersticks as ordered    Subjective:  -Poor appetite poor oral intake but no vomiting, no nausea -Had BM on 12/06/2022 -Voiding well today -Right foot pain persist but is not worse No fever  Or chills   Physical Exam: Vitals:   12/06/22 2024 12/07/22 0443 12/07/22 0800 12/07/22 1300  BP: 120/60 112/60 125/70 123/62  Pulse: 62 (!) 51 (!) 57 69  Resp: 20 20  18   Temp: 97.9 F (36.6 C) 97.8 F (36.6 C)  97.8 F (36.6 C)  TempSrc: Oral Oral  Oral  SpO2: 96% 97%  98%  Weight:      Height:        Physical Exam  Gen:- Awake Alert, in no acute distress  HEENT:- .AT, No sclera icterus Neck-Supple Neck,No JVD,.  Lungs-  CTAB , fair air movement bilaterally  CV- S1, S2 normal, RRR Abd-  +ve B.Sounds, Abd Soft, No tenderness,    Psych-affect is appropriate, oriented x3 Neuro-no new focal deficits, no tremors MSK--valgus deformation affecting first metatarsals in her feet (right more than left); erythematous changes and more swelling seen in her right foot.  Patient expressed tenderness with palpation. -Good pedal pulses     Family Communication: No family at bedside.  Disposition: Home in 1 to 2 days if renal function improves  Author: Shon Hale, MD 12/07/2022 3:56 PM  For on call review [email or web address].

## 2022-12-07 NOTE — Progress Notes (Signed)
Physical Therapy Treatment Patient Details Name: [name] MRN: [id number] DOB: [birth year] Today's Date: 12/07/2022   History of Present Illness [name] is a 88 y.o. female with medical history significant for diabetes mellitus, hypertension, coronary artery disease.  Patient presented to the ED with complaints of bilateral foot pain over the past 2 to 3 weeks.  It is worse on the right foot with weightbearing making it difficulty to walk, and involving the 1st  MTP, and calcaneus.  She has some redness to the first MTP joint but she reports this is mostly chronic.  She denies vomiting, no loose stools and has maintained okay oral intake.  No Fevers no chills.    PT Comments    Pt in bed, eager for therapy this morning.  Pt did not require assistance or cues to transfer to EOB.  Good seated balance, CGA to stand and use of walker to complete full loop in back hall.  Pt did c/o pain in Rt medial ankle with weight bearing to 5/10 but none at rest.  Pt instructed with therex to complete (as below with addition of seated gastroc stretch using towel). Pt able to complete all these with minimal assist and was given written instructions.  Pt remained seated upon leaving with call bell/phone within reach.  Distance of approx 140 without fatigue, however slow cadence with some pain.    Recommendations for follow up therapy are one component of a multi-disciplinary discharge planning process, led by the attending physician.  Recommendations may be updated based on patient status, additional functional criteria and insurance authorization.  Follow Up Recommendations       Assistance Recommended at Discharge Set up Supervision/Assistance  Patient can return home with the following A little help with walking and/or transfers;A little help with bathing/dressing/bathroom;Help with stairs or ramp for entrance;Assistance with cooking/housework   Equipment Recommendations  None recommended  by PT       Precautions / Restrictions Precautions Precautions: Fall Restrictions Weight Bearing Restrictions: No     Mobility  Bed Mobility Overal bed mobility: Modified Independent                  Transfers Overall transfer level: Needs assistance Equipment used: Rolling walker (2 wheels) Transfers: Sit to/from Stand, Bed to chair/wheelchair/BSC Sit to Stand: Supervision   Step pivot transfers: Supervision       General transfer comment: good return for transfers using RW    Ambulation/Gait Ambulation/Gait assistance: Supervision Gait Distance (Feet): 140 Feet Assistive device: Rolling walker (2 wheels) Gait Pattern/deviations: Decreased step length - right, Decreased step length - left, Decreased stride length Gait velocity: decreased     General Gait Details: slow cadence without loss of balance with mild/moderate increase in right foot pain when weightbearing            Cognition Arousal/Alertness: Awake/alert Behavior During Therapy: WFL for tasks assessed/performed Overall Cognitive Status: Within Functional Limits for tasks assessed                                          Exercises General Exercises - Lower Extremity Ankle Circles/Pumps: AROM, Both, 10 reps Long Arc Quad: AROM, Both, 10 reps Hip ABduction/ADduction: AROM, Both, 10 reps Straight Leg Raises: AROM, Both, 10 reps Hip Flexion/Marching: AROM, Both, 10 reps      PT Goals (current goals can now be found in  the care plan section) Acute Rehab PT Goals Patient Stated Goal: return home with family to assist PT Goal Formulation: With patient Time For Goal Achievement: 12/09/22 Potential to Achieve Goals: Good Progress towards PT goals: Progressing toward goals    Frequency    Min 3X/week      PT Plan  Continue per progression.       AM-PAC PT "6 Clicks" Mobility   Outcome Measure  Help needed turning from your back to your side while in a flat bed  without using bedrails?: None Help needed moving from lying on your back to sitting on the side of a flat bed without using bedrails?: None Help needed moving to and from a bed to a chair (including a wheelchair)?: A Little Help needed standing up from a chair using your arms (e.g., wheelchair or bedside chair)?: A Little Help needed to walk in hospital room?: A Little Help needed climbing 3-5 steps with a railing? : A Lot 6 Click Score: 19    End of Session Equipment Utilized During Treatment: Gait belt Activity Tolerance: Patient tolerated treatment well;Patient limited by fatigue Patient left: in chair;with call bell/phone within reach Nurse Communication: Mobility status PT Visit Diagnosis: Unsteadiness on feet (R26.81);Other abnormalities of gait and mobility (R26.89);Muscle weakness (generalized) (M62.81)     Time: 4098-1191 PT Time Calculation (min) (ACUTE ONLY): 35 min  Charges:  $Gait Training: 8-22 mins $Therapeutic Exercise: 8-22 mins                      [name], [name] 12/07/2022, 10:03 AM

## 2022-12-08 DIAGNOSIS — N179 Acute kidney failure, unspecified: Secondary | ICD-10-CM | POA: Diagnosis not present

## 2022-12-08 LAB — BASIC METABOLIC PANEL
Anion gap: 6 (ref 5–15)
BUN: 44 mg/dL — ABNORMAL HIGH (ref 8–23)
CO2: 21 mmol/L — ABNORMAL LOW (ref 22–32)
Calcium: 8.4 mg/dL — ABNORMAL LOW (ref 8.9–10.3)
Chloride: 108 mmol/L (ref 98–111)
Creatinine, Ser: 1.94 mg/dL — ABNORMAL HIGH (ref 0.44–1.00)
GFR, Estimated: 25 mL/min — ABNORMAL LOW (ref >60)
Glucose, Bld: 167 mg/dL — ABNORMAL HIGH (ref 70–99)
Potassium: 4 mmol/L (ref 3.5–5.1)
Sodium: 135 mmol/L (ref 135–145)

## 2022-12-08 LAB — CBC
HCT: 29.4 % — ABNORMAL LOW (ref 36.0–46.0)
Hemoglobin: 9.7 g/dL — ABNORMAL LOW (ref 12.0–15.0)
MCH: 31.1 pg (ref 26.0–34.0)
MCHC: 33 g/dL (ref 30.0–36.0)
MCV: 94.2 fL (ref 80.0–100.0)
Platelets: 150 10*3/uL (ref 150–400)
RBC: 3.12 MIL/uL — ABNORMAL LOW (ref 3.87–5.11)
RDW: 12.1 % (ref 11.5–15.5)
WBC: 7.6 10*3/uL (ref 4.0–10.5)
nRBC: 0 % (ref 0.0–0.2)

## 2022-12-08 LAB — GLUCOSE, CAPILLARY
Glucose-Capillary: 149 mg/dL — ABNORMAL HIGH (ref 70–99)
Glucose-Capillary: 183 mg/dL — ABNORMAL HIGH (ref 70–99)

## 2022-12-08 MED ORDER — ALLOPURINOL 100 MG PO TABS: 100.0000 mg | ORAL_TABLET | Freq: Every day | ORAL | 1 refills | Status: AC

## 2022-12-08 MED ORDER — FUROSEMIDE 20 MG PO TABS
20.0000 mg | ORAL_TABLET | Freq: Every day | ORAL | 1 refills | Status: AC
Start: 2022-12-10 — End: 2024-05-30

## 2022-12-08 MED ORDER — PREDNISONE 20 MG PO TABS: 20.0000 mg | ORAL_TABLET | Freq: Every day | ORAL | 0 refills | Status: AC

## 2022-12-08 NOTE — TOC Transition Note (Signed)
Transition of Care [facility]) - CM/SW Discharge Note   Patient Details  Name: [name] MRN: [id number] Date of Birth: 1934-10-12  [facility] CM/SW Contact:  Villa Herb, LCSWA Phone Number: 12/08/2022, 10:19 AM   Clinical Narrative:    CSW updated that pt will be discharging home today. CSW updated Kandee Keen with [facility] of plan for D/C today. MD placed [facility] PT orders. TOC signing off.   Final next level of care: Home w Home Health Services Barriers to Discharge: Barriers Resolved   Patient Goals and CMS Choice CMS Medicare.gov Compare Post Acute Care list provided to:: Patient Choice offered to / list presented to : Patient  Discharge Placement                         Discharge Plan and Services Additional resources added to the After Visit Summary for   In-house Referral: Clinical Social Work   Post Acute Care Choice: Home Health                    HH Arranged: PT [facility] Agency: [facility] Date [facility] Agency Contacted: 12/08/22   Representative spoke with at [facility] Agency: Kandee Keen  Social Determinants of Health (SDOH) Interventions SDOH Screenings   Food Insecurity: No Food Insecurity (12/05/2022)  Housing: Low Risk  (12/05/2022)  Transportation Needs: No Transportation Needs (12/05/2022)  Utilities: Not At Risk (12/05/2022)  Depression (PHQ2-9): High Risk (12/20/2021)  Tobacco Use: Low Risk  (12/05/2022)     Readmission Risk Interventions     No data to display

## 2022-12-08 NOTE — Progress Notes (Signed)
Nsg Discharge Note  Admit Date:  12/05/2022 Discharge date: 12/08/2022   DENISHA BEIN to be D/C'd Home per MD order.  AVS completed.  Patient/caregiver able to verbalize understanding.  Discharge Medication: Allergies as of 12/08/2022       Reactions   Other Itching   Chest monitor adhesives    Codeine Nausea Only   Milk-related Compounds Diarrhea        Medication List     STOP taking these medications    famotidine 20 MG tablet Commonly known as: PEPCID       TAKE these medications    allopurinol 100 MG tablet Commonly known as: ZYLOPRIM Take 1 tablet (100 mg total) by mouth daily.   aspirin EC 81 MG tablet Take 81 mg by mouth daily. Swallow whole.   atorvastatin 80 MG tablet Commonly known as: LIPITOR Take 80 mg by mouth daily.   ciclopirox 8 % solution Commonly known as: PENLAC Apply 1 Application topically at bedtime. Apply over nail and surrounding skin. Apply daily over previous coat. After seven (7) days, may remove with alcohol and continue cycle.   fluticasone 50 MCG/ACT nasal spray Commonly known as: FLONASE Place 1-2 sprays each nostril daily. Aim upward and outward.   furosemide 20 MG tablet Commonly known as: Lasix Take 1 tablet (20 mg total) by mouth daily. Start taking on: December 10, 2022 What changed:  medication strength how much to take These instructions start on December 10, 2022. If you are unsure what to do until then, ask your doctor or other care provider.   gabapentin 100 MG capsule Commonly known as: NEURONTIN TAKE ONE CAPSULE BY MOUTH THREE TIMES DAILY   glipiZIDE 5 MG 24 hr tablet Commonly known as: GLUCOTROL XL Take 5 mg by mouth 2 (two) times daily.   ipratropium 0.03 % nasal spray Commonly known as: ATROVENT Place 2 sprays in each nostril up to three times daily as needed for runny nose   latanoprost 0.005 % ophthalmic solution Commonly known as: XALATAN Place 1 drop into both eyes at bedtime.   metoprolol  succinate 25 MG 24 hr tablet Commonly known as: TOPROL-XL Take 25 mg by mouth daily.   nitroGLYCERIN 0.4 MG SL tablet Commonly known as: NITROSTAT Place 1 tablet (0.4 mg total) under the tongue every 5 (five) minutes as needed.   pantoprazole 40 MG tablet Commonly known as: PROTONIX Take 40 mg by mouth every morning.   predniSONE 20 MG tablet Commonly known as: DELTASONE Take 1 tablet (20 mg total) by mouth daily with breakfast for 5 days. For gout   valsartan 160 MG tablet Commonly known as: DIOVAN TAKE 1 TABLET BY MOUTH EVERY DAY        Discharge Assessment: Vitals:   12/08/22 0441 12/08/22 0807  BP: (!) 107/55 134/63  Pulse: (!) 52 (!) 52  Resp: 19   Temp: 98 F (36.7 C)   SpO2: 98%    Skin clean, dry and intact without evidence of skin break down, no evidence of skin tears noted. IV catheter discontinued intact. Site without signs and symptoms of complications - no redness or edema noted at insertion site, patient denies c/o pain - only slight tenderness at site.  Dressing with slight pressure applied.  D/c Instructions-Education: Discharge instructions given to patient/family with verbalized understanding. D/c education completed with patient/family including follow up instructions, medication list, d/c activities limitations if indicated, with other d/c instructions as indicated by MD - patient able to verbalize understanding,  all questions fully answered. Patient instructed to return to ED, call 911, or call MD for any changes in condition.  Patient escorted via WC, and D/C home via private auto.  Laurena Spies, RN 12/08/2022 1:00 PM

## 2022-12-08 NOTE — Discharge Summary (Signed)
[name], is a 88 y.o. female  DOB Aug 14, 1934  MRN [id number].  Admission date:  12/05/2022  Admitting Physician  Onnie Boer, MD  Discharge Date:  12/08/2022   Primary MD  Pcp, No  Recommendations for primary care physician for things to follow:   1)Avoid ibuprofen/Advil/Aleve/Motrin/Goody Powders/Naproxen/BC powders/Meloxicam/Diclofenac/Indomethacin and other Nonsteroidal anti-inflammatory medications as these will make you more likely to bleed and can cause stomach ulcers, can also cause Kidney problems.   2)Repeat BMP blood test in 5 to 7 days advised  3)Stop Lasix 40 mg daily, reduce Lasix to 20 mg daily starting 12/10/2022  4) take prednisone and allopurinol as prescribed for gout  Admission Diagnosis  AKI (acute kidney injury) (HCC) [N17.9]   Discharge Diagnosis  AKI (acute kidney injury) (HCC) [N17.9]    Principal Problem:   AKI (acute kidney injury) (HCC) Active Problems:   Pain in right foot   CKD (chronic kidney disease) stage 4, GFR 15-29 ml/min (HCC)   Diabetes (HCC)   HTN (hypertension)      Past Medical History:  Diagnosis Date   CAD (coronary artery disease)    a. s/p NSTEMI in 07/2021 with cath showing minimal CAD and no culprit lesion identified   Constipation    Cramps, extremity    legs and hands   Diabetes (HCC)    type II   Enterococcus UTI 02/03/2016   H/O cardiovascular stress test 03/05/2009   normal, no ischemia   H/O Doppler ultrasound    lower ext arterial doppler 04/13/09-no evidence of arterial insufficiency, normal values; distal aorta 2.4x2.7   HTN (hypertension)    Murmur, cardiac    echo 02/25/13- EF 60-65%, impaired relaxation-grade 1 diastolic dysfunction, mild concentric lvh, mild to moderate aortic regurgitation   Myocardial infarction [facility])    january   Pancreatic cyst    Pneumonia    May 2017   Postoperative anemia due to  acute blood loss 02/02/2016   Primary localized osteoarthritis of right knee    Recurrent upper respiratory infection (URI)    TIA (transient ischemic attack)    carotid doppler 05/08/11-normal patency    Past Surgical History:  Procedure Laterality Date   ABDOMINAL HYSTERECTOMY     BIOPSY  03/31/2022   Procedure: BIOPSY;  Surgeon: Lemar Lofty., MD;  Location: [facility];  Service: Gastroenterology;;   BUNIONECTOMY Left    COLONOSCOPY  04/29/2010   Dr. Matthias Hughs; diverticulosis in the entire colon, otherwise normal exam. Recommended repeat colonoscopy in 5 years for surveillance due to personal history of polyps.   ESOPHAGOGASTRODUODENOSCOPY (EGD) WITH PROPOFOL N/A 03/31/2022   Procedure: ESOPHAGOGASTRODUODENOSCOPY (EGD) WITH PROPOFOL;  Surgeon: Meridee Score Netty Starring., MD;  Location: [facility];  Service: Gastroenterology;  Laterality: N/A;   EUS N/A 03/31/2022   Procedure: UPPER ENDOSCOPIC ULTRASOUND (EUS) LINEAR;  Surgeon: Lemar Lofty., MD;  Location: [facility];  Service: Gastroenterology;  Laterality: N/A;   FINE NEEDLE ASPIRATION  03/31/2022   Procedure: FINE NEEDLE ASPIRATION (FNA) LINEAR;  Surgeon: Corliss Parish  Montez Hageman., MD;  Location: [facility];  Service: Gastroenterology;;   Gaspar Bidding DILATION N/A 03/31/2022   Procedure: Jacklyn Shell;  Surgeon: Lemar Lofty., MD;  Location: [facility];  Service: Gastroenterology;  Laterality: N/A;   TOTAL KNEE ARTHROPLASTY Right 02/01/2016   Procedure: TOTAL KNEE ARTHROPLASTY;  Surgeon: Salvatore Marvel, MD;  Location: [facility] OR;  Service: Orthopedics;  Laterality: Right;       HPI  from the history and physical done on the day of admission:    Chief Complaint: Foot pain   HPI: [name] is a 88 y.o. female with medical history significant for diabetes mellitus, hypertension, coronary artery disease.  Patient presented to the ED with complaints of bilateral foot pain over the past 2 to 3 weeks.   It is worse on the right foot with weightbearing making it difficulty to walk, and involving the 1st  MTP, and calcaneus.  She has some redness to the first MTP joint but she reports this is mostly chronic. She denies vomiting, no loose stools and has maintained okay oral intake.  No Fevers no chills.   ED Course: Temperature 98.1.  Heart rate 58-81.  Respirate rate 18.  Blood pressure systolic 108-149.  X-ray of the left and right foot negative for acute abnormality, shows severe first MTP joint arthritis  hallux valgus deformity and hammertoe deformities. Creatinine elevated at 2.46. 1 L LR bolus given, 60 mg prednisone given for possible gout.   Review of Systems: As per HPI all other systems reviewed and negative    Hospital Course:   Assessment and Plan:  AKI on CKD 4-due to dehydration/poor oral intake -Creatinine trending down with IV fluids -Lasix dose adjusted at discharge -Repeat BMP in 5 to 7 days advised -Avoid NSAIDs   Pain in right foot--gout suspected -Mostly first MTP, which shows significant valgus abnormality, also pain to the calcaneus area.   -Patient reports porting prior history of gout and pain, swelling, erythema and severity resembling prior experience but more worse. -Elevated uric acid levels noted -Continue allopurinol    -Physical therapy has examined patient with recommendation for home health PT at discharge -Use Prednisone as ordered -No NSAIDs in the setting of stage IV renal failure.   HTN (hypertension) -c/n valsartan and  metoprolol.   DM2 --A1c 7.4 reflecting uncontrolled diabetes with hyperglycemia PTA -Restart glipizide  Discharge Condition: stable  Follow UP   Follow-up Information     Care, [facility] Follow up.   Specialty: Home Health Services Why: PT will call to schedule your first visit. Contact information: [address] [place] [address] [phone]                 Diet and Activity  recommendation:  As advised  Discharge Instructions    Discharge Instructions     Call MD for:  difficulty breathing, headache or visual disturbances   Complete by: As directed    Call MD for:  persistant dizziness or light-headedness   Complete by: As directed    Call MD for:  persistant nausea and vomiting   Complete by: As directed    Call MD for:  temperature >100.4   Complete by: As directed    Diet - low sodium heart healthy   Complete by: As directed    Discharge instructions   Complete by: As directed    1)Avoid ibuprofen/Advil/Aleve/Motrin/Goody Powders/Naproxen/BC powders/Meloxicam/Diclofenac/Indomethacin and other Nonsteroidal anti-inflammatory medications as these will make you more likely to bleed and  can cause stomach ulcers, can also cause Kidney problems.   2)Repeat BMP blood test in 5 to 7 days advised  3)Stop Lasix 40 mg daily, reduce Lasix to 20 mg daily starting 12/10/2022  4) take prednisone and allopurinol as prescribed for gout   Increase activity slowly   Complete by: As directed          Discharge Medications     Allergies as of 12/08/2022       Reactions   Other Itching   Chest monitor adhesives    Codeine Nausea Only   Milk-related Compounds Diarrhea        Medication List     STOP taking these medications    famotidine 20 MG tablet Commonly known as: PEPCID       TAKE these medications    allopurinol 100 MG tablet Commonly known as: ZYLOPRIM Take 1 tablet (100 mg total) by mouth daily.   aspirin EC 81 MG tablet Take 81 mg by mouth daily. Swallow whole.   atorvastatin 80 MG tablet Commonly known as: LIPITOR Take 80 mg by mouth daily.   ciclopirox 8 % solution Commonly known as: PENLAC Apply 1 Application topically at bedtime. Apply over nail and surrounding skin. Apply daily over previous coat. After seven (7) days, may remove with alcohol and continue cycle.   fluticasone 50 MCG/ACT nasal spray Commonly known as:  FLONASE Place 1-2 sprays each nostril daily. Aim upward and outward.   furosemide 20 MG tablet Commonly known as: Lasix Take 1 tablet (20 mg total) by mouth daily. Start taking on: December 10, 2022 What changed:  medication strength how much to take These instructions start on December 10, 2022. If you are unsure what to do until then, ask your doctor or other care provider.   gabapentin 100 MG capsule Commonly known as: NEURONTIN TAKE ONE CAPSULE BY MOUTH THREE TIMES DAILY   glipiZIDE 5 MG 24 hr tablet Commonly known as: GLUCOTROL XL Take 5 mg by mouth 2 (two) times daily.   ipratropium 0.03 % nasal spray Commonly known as: ATROVENT Place 2 sprays in each nostril up to three times daily as needed for runny nose   latanoprost 0.005 % ophthalmic solution Commonly known as: XALATAN Place 1 drop into both eyes at bedtime.   metoprolol succinate 25 MG 24 hr tablet Commonly known as: TOPROL-XL Take 25 mg by mouth daily.   nitroGLYCERIN 0.4 MG SL tablet Commonly known as: NITROSTAT Place 1 tablet (0.4 mg total) under the tongue every 5 (five) minutes as needed.   pantoprazole 40 MG tablet Commonly known as: PROTONIX Take 40 mg by mouth every morning.   predniSONE 20 MG tablet Commonly known as: DELTASONE Take 1 tablet (20 mg total) by mouth daily with breakfast for 5 days. For gout   valsartan 160 MG tablet Commonly known as: DIOVAN TAKE 1 TABLET BY MOUTH EVERY DAY       Major procedures and Radiology Reports - PLEASE review detailed and final reports for all details, in brief -   DG Foot Complete Right  Result Date: 12/05/2022 CLINICAL DATA:  Bilateral foot pain.  Worse on the right. EXAM: RIGHT FOOT COMPLETE - 3+ VIEW; LEFT FOOT - COMPLETE 3+ VIEW COMPARISON:  None Available. FINDINGS: Left foot: There is no evidence of fracture or dislocation. Degenerative changes at the first metatarsophalangeal joint. Hallux valgus and hammertoe deformities. Plantar calcaneal spurring and  Achilles enthesopathy. Soft tissues are unremarkable. Right foot: There is no evidence  of fracture or dislocation. Severe first metatarsophalangeal joint arthritis and hallux valgus deformity. Hammertoe deformities. Plantar calcaneal spurring and Achilles enthesopathy. Mild soft tissue swelling about the dorsum of the foot. IMPRESSION: LEFT FOOT: 1. No acute fracture or dislocation. 2. Hallux valgus and hammertoe deformities. 3. Mild first metatarsophalangeal joint arthritis. RIGHT FOOT: 1. No acute fracture or dislocation. 2. Severe first metatarsophalangeal joint arthritis and hallux valgus deformity and hammertoe deformities. Electronically Signed   By: Larose Hires D.O.   On: 12/05/2022 14:34   DG Foot Complete Left  Result Date: 12/05/2022 CLINICAL DATA:  Bilateral foot pain.  Worse on the right. EXAM: RIGHT FOOT COMPLETE - 3+ VIEW; LEFT FOOT - COMPLETE 3+ VIEW COMPARISON:  None Available. FINDINGS: Left foot: There is no evidence of fracture or dislocation. Degenerative changes at the first metatarsophalangeal joint. Hallux valgus and hammertoe deformities. Plantar calcaneal spurring and Achilles enthesopathy. Soft tissues are unremarkable. Right foot: There is no evidence of fracture or dislocation. Severe first metatarsophalangeal joint arthritis and hallux valgus deformity. Hammertoe deformities. Plantar calcaneal spurring and Achilles enthesopathy. Mild soft tissue swelling about the dorsum of the foot. IMPRESSION: LEFT FOOT: 1. No acute fracture or dislocation. 2. Hallux valgus and hammertoe deformities. 3. Mild first metatarsophalangeal joint arthritis. RIGHT FOOT: 1. No acute fracture or dislocation. 2. Severe first metatarsophalangeal joint arthritis and hallux valgus deformity and hammertoe deformities. Electronically Signed   By: Larose Hires D.O.   On: 12/05/2022 14:34    Today   Subjective    [name] today has no new complaints  No fever  Or chills   No Nausea, Vomiting or  Diarrhea -Voiding well        Patient has been seen and examined prior to discharge   Objective   Blood pressure 134/63, pulse (!) 52, temperature 98 F (36.7 C), temperature source Oral, resp. rate 19, height 5\' 6"  (1.676 m), weight 74.2 kg, SpO2 98 %.   Intake/Output Summary (Last 24 hours) at 12/08/2022 1252 Last data filed at 12/08/2022 0500 Gross per 24 hour  Intake 1925.02 ml  Output --  Net 1925.02 ml    Exam Gen:- Awake Alert, no acute distress  HEENT:- [place].AT, No sclera icterus Neck-Supple Neck,No JVD,.  Lungs-  CTAB , good air movement bilaterally CV- S1, S2 normal, regular Abd-  +ve B.Sounds, Abd Soft, No tenderness,    Extremity/Skin:- No  edema,   good pulses Psych-affect is appropriate, oriented x3 Neuro-no new focal deficits, no tremors  MSK--valgus deformation affecting first metatarsals in her feet (right more than left); improved erythema and improved swelling of her right foot    Data Review   CBC w Diff:  Lab Results  Component Value Date   WBC 7.6 12/08/2022   HGB 9.7 (L) 12/08/2022   HGB 12.6 01/13/2022   HCT 29.4 (L) 12/08/2022   HCT 38.2 01/13/2022   PLT 150 12/08/2022   PLT 198 01/13/2022   LYMPHOPCT 8 12/05/2022   MONOPCT 11 12/05/2022   EOSPCT 0 12/05/2022   BASOPCT 1 12/05/2022    CMP:  Lab Results  Component Value Date   NA 135 12/08/2022   NA 142 01/13/2022   K 4.0 12/08/2022   CL 108 12/08/2022   CO2 21 (L) 12/08/2022   BUN 44 (H) 12/08/2022   BUN 36 (H) 01/13/2022   CREATININE 1.94 (H) 12/08/2022   CREATININE 1.25 (H) 03/01/2013   PROT 7.5 10/02/2022   ALBUMIN 3.9 10/02/2022   BILITOT 1.5 (H) 10/02/2022  ALKPHOS 195 (H) 10/02/2022   AST 46 (H) 10/02/2022   ALT 29 10/02/2022  .  Total Discharge time is about 33 minutes  Shon Hale M.D on 12/08/2022 at 12:52 PM  Go to [email or web address] -  for contact info  [facility] - Office  [phone]

## 2022-12-12 ENCOUNTER — Telehealth: Payer: Self-pay | Admitting: Internal Medicine

## 2022-12-12 ENCOUNTER — Ambulatory Visit: Payer: Self-pay | Admitting: Internal Medicine

## 2022-12-12 NOTE — Telephone Encounter (Signed)
Called and spoke w/ pt she verbalized that she went to the ED for gout that got extremely to far. She states that she is better now but is still having some foot tingling. She just wanted MW to be aware. Routing as [employer]

## 2022-12-14 DIAGNOSIS — D631 Anemia in chronic kidney disease: Secondary | ICD-10-CM | POA: Diagnosis not present

## 2022-12-14 DIAGNOSIS — M19071 Primary osteoarthritis, right ankle and foot: Secondary | ICD-10-CM | POA: Diagnosis not present

## 2022-12-14 DIAGNOSIS — Z79899 Other long term (current) drug therapy: Secondary | ICD-10-CM | POA: Diagnosis not present

## 2022-12-14 DIAGNOSIS — Z9181 History of falling: Secondary | ICD-10-CM | POA: Diagnosis not present

## 2022-12-14 DIAGNOSIS — I252 Old myocardial infarction: Secondary | ICD-10-CM | POA: Diagnosis not present

## 2022-12-14 DIAGNOSIS — I2510 Atherosclerotic heart disease of native coronary artery without angina pectoris: Secondary | ICD-10-CM | POA: Diagnosis not present

## 2022-12-14 DIAGNOSIS — Z8701 Personal history of pneumonia (recurrent): Secondary | ICD-10-CM | POA: Diagnosis not present

## 2022-12-14 DIAGNOSIS — N184 Chronic kidney disease, stage 4 (severe): Secondary | ICD-10-CM | POA: Diagnosis not present

## 2022-12-14 DIAGNOSIS — I1310 Hypertensive heart and chronic kidney disease without heart failure, with stage 1 through stage 4 chronic kidney disease, or unspecified chronic kidney disease: Secondary | ICD-10-CM | POA: Diagnosis not present

## 2022-12-14 DIAGNOSIS — E1122 Type 2 diabetes mellitus with diabetic chronic kidney disease: Secondary | ICD-10-CM | POA: Diagnosis not present

## 2022-12-14 DIAGNOSIS — Z8673 Personal history of transient ischemic attack (TIA), and cerebral infarction without residual deficits: Secondary | ICD-10-CM | POA: Diagnosis not present

## 2022-12-14 DIAGNOSIS — Z96651 Presence of right artificial knee joint: Secondary | ICD-10-CM | POA: Diagnosis not present

## 2022-12-14 DIAGNOSIS — N179 Acute kidney failure, unspecified: Secondary | ICD-10-CM | POA: Diagnosis not present

## 2022-12-14 DIAGNOSIS — Z7982 Long term (current) use of aspirin: Secondary | ICD-10-CM | POA: Diagnosis not present

## 2022-12-14 DIAGNOSIS — Z7984 Long term (current) use of oral hypoglycemic drugs: Secondary | ICD-10-CM | POA: Diagnosis not present

## 2022-12-14 DIAGNOSIS — M19072 Primary osteoarthritis, left ankle and foot: Secondary | ICD-10-CM | POA: Diagnosis not present

## 2022-12-16 DIAGNOSIS — N184 Chronic kidney disease, stage 4 (severe): Secondary | ICD-10-CM | POA: Diagnosis not present

## 2022-12-16 DIAGNOSIS — I10 Essential (primary) hypertension: Secondary | ICD-10-CM | POA: Diagnosis not present

## 2022-12-16 DIAGNOSIS — M109 Gout, unspecified: Secondary | ICD-10-CM | POA: Diagnosis not present

## 2022-12-16 DIAGNOSIS — Z299 Encounter for prophylactic measures, unspecified: Secondary | ICD-10-CM | POA: Diagnosis not present

## 2022-12-16 DIAGNOSIS — E1122 Type 2 diabetes mellitus with diabetic chronic kidney disease: Secondary | ICD-10-CM | POA: Diagnosis not present

## 2022-12-20 DIAGNOSIS — I10 Essential (primary) hypertension: Secondary | ICD-10-CM | POA: Diagnosis not present

## 2022-12-20 DIAGNOSIS — Z299 Encounter for prophylactic measures, unspecified: Secondary | ICD-10-CM | POA: Diagnosis not present

## 2022-12-20 DIAGNOSIS — M10072 Idiopathic gout, left ankle and foot: Secondary | ICD-10-CM | POA: Diagnosis not present

## 2022-12-20 DIAGNOSIS — N1832 Chronic kidney disease, stage 3b: Secondary | ICD-10-CM | POA: Diagnosis not present

## 2022-12-20 DIAGNOSIS — E1165 Type 2 diabetes mellitus with hyperglycemia: Secondary | ICD-10-CM | POA: Diagnosis not present

## 2022-12-21 DIAGNOSIS — E1122 Type 2 diabetes mellitus with diabetic chronic kidney disease: Secondary | ICD-10-CM | POA: Diagnosis not present

## 2022-12-21 DIAGNOSIS — I1310 Hypertensive heart and chronic kidney disease without heart failure, with stage 1 through stage 4 chronic kidney disease, or unspecified chronic kidney disease: Secondary | ICD-10-CM | POA: Diagnosis not present

## 2022-12-21 DIAGNOSIS — M19072 Primary osteoarthritis, left ankle and foot: Secondary | ICD-10-CM | POA: Diagnosis not present

## 2022-12-21 DIAGNOSIS — M19071 Primary osteoarthritis, right ankle and foot: Secondary | ICD-10-CM | POA: Diagnosis not present

## 2022-12-22 ENCOUNTER — Encounter: Payer: Self-pay | Admitting: Gastroenterology

## 2022-12-25 ENCOUNTER — Other Ambulatory Visit: Payer: Self-pay | Admitting: Internal Medicine

## 2023-01-08 DIAGNOSIS — E1165 Type 2 diabetes mellitus with hyperglycemia: Secondary | ICD-10-CM | POA: Diagnosis not present

## 2023-01-08 DIAGNOSIS — I10 Essential (primary) hypertension: Secondary | ICD-10-CM | POA: Diagnosis not present

## 2023-01-25 ENCOUNTER — Other Ambulatory Visit: Payer: Self-pay | Admitting: Gastroenterology

## 2023-01-25 DIAGNOSIS — K219 Gastro-esophageal reflux disease without esophagitis: Secondary | ICD-10-CM

## 2023-02-01 ENCOUNTER — Ambulatory Visit (INDEPENDENT_AMBULATORY_CARE_PROVIDER_SITE_OTHER): Payer: HMO | Admitting: Orthopaedic Surgery

## 2023-02-01 ENCOUNTER — Encounter: Payer: Self-pay | Admitting: Orthopaedic Surgery

## 2023-02-01 VITALS — BP 135/76 | HR 75 | Ht 66.0 in | Wt 164.0 lb

## 2023-02-01 DIAGNOSIS — M79671 Pain in right foot: Secondary | ICD-10-CM | POA: Diagnosis not present

## 2023-02-01 DIAGNOSIS — G8929 Other chronic pain: Secondary | ICD-10-CM

## 2023-02-01 NOTE — Patient Instructions (Signed)
Aspercreme, Voltaren gel, or Biofreeze

## 2023-02-01 NOTE — Progress Notes (Signed)
Subjective:    Patient ID: [name], female    DOB: 04-27-1935, 88 y.o.   MRN: [id number]  HPI She has had lateral foot pain on the right foot since May.  She has no trauma.  She went to [facility] ER on 11-14-22 for the pain.  She had X-rays done which were negative except for changes in the right great toe.  I have reviewed the notes and the reports.  I do not have a CD to review.  She has gout and is on allopurinol.  She has no redness.  It hurts when she walks, usually after a few feet.  She uses a cane.  It has not improved.  She is concerned. She has no swelling, no redness, no numbness.  She cannot take NSAIDs.  Review of Systems  Constitutional:  Positive for activity change.  Musculoskeletal:  Positive for arthralgias and gait problem.  All other systems reviewed and are negative. For Review of Systems, all other systems reviewed and are negative.  The following is a summary of the past history medically, past history surgically, known current medicines, social history and family history.  This information is gathered electronically by the computer from prior information and documentation.  I review this each visit and have found including this information at this point in the chart is beneficial and informative.   Past Medical History:  Diagnosis Date   CAD (coronary artery disease)    a. s/p NSTEMI in 07/2021 with cath showing minimal CAD and no culprit lesion identified   Constipation    Cramps, extremity    legs and hands   Diabetes (HCC)    type II   Enterococcus UTI 02/03/2016   H/O cardiovascular stress test 03/05/2009   normal, no ischemia   H/O Doppler ultrasound    lower ext arterial doppler 04/13/09-no evidence of arterial insufficiency, normal values; distal aorta 2.4x2.7   HTN (hypertension)    Murmur, cardiac    echo 02/25/13- EF 60-65%, impaired relaxation-grade 1 diastolic dysfunction, mild concentric lvh, mild to moderate aortic regurgitation    Myocardial infarction [facility])    january   Pancreatic cyst    Pneumonia    May 2017   Postoperative anemia due to acute blood loss 02/02/2016   Primary localized osteoarthritis of right knee    Recurrent upper respiratory infection (URI)    TIA (transient ischemic attack)    carotid doppler 05/08/11-normal patency    Past Surgical History:  Procedure Laterality Date   ABDOMINAL HYSTERECTOMY     BIOPSY  03/31/2022   Procedure: BIOPSY;  Surgeon: Lemar Lofty., MD;  Location: [facility] ENDOSCOPY;  Service: Gastroenterology;;   BUNIONECTOMY Left    COLONOSCOPY  04/29/2010   Dr. Matthias Hughs; diverticulosis in the entire colon, otherwise normal exam. Recommended repeat colonoscopy in 5 years for surveillance due to personal history of polyps.   ESOPHAGOGASTRODUODENOSCOPY (EGD) WITH PROPOFOL N/A 03/31/2022   Procedure: ESOPHAGOGASTRODUODENOSCOPY (EGD) WITH PROPOFOL;  Surgeon: Meridee Score Netty Starring., MD;  Location: [facility]OSCOPY;  Service: Gastroenterology;  Laterality: N/A;   EUS N/A 03/31/2022   Procedure: UPPER ENDOSCOPIC ULTRASOUND (EUS) LINEAR;  Surgeon: Lemar Lofty., MD;  Location: [facility];  Service: Gastroenterology;  Laterality: N/A;   FINE NEEDLE ASPIRATION  03/31/2022   Procedure: FINE NEEDLE ASPIRATION (FNA) LINEAR;  Surgeon: Lemar Lofty., MD;  Location: [facility];  Service: Gastroenterology;;   Gaspar Bidding DILATION N/A 03/31/2022   Procedure: Gaspar Bidding DILATION;  Surgeon: Lemar Lofty., MD;  Location: [facility];  Service: Gastroenterology;  Laterality: N/A;   TOTAL KNEE ARTHROPLASTY Right 02/01/2016   Procedure: TOTAL KNEE ARTHROPLASTY;  Surgeon: Salvatore Marvel, MD;  Location[facility] OR;  Service: Orthopedics;  Laterality: Right;    Current Outpatient Medications on File Prior to Visit  Medication Sig Dispense Refill   allopurinol (ZYLOPRIM) 100 MG tablet Take 1 tablet (100 mg total) by mouth daily. 30 tablet 1   aspirin EC 81 MG tablet Take 81 mg by  mouth daily. Swallow whole.     atorvastatin (LIPITOR) 80 MG tablet Take 80 mg by mouth daily.     ciclopirox (PENLAC) 8 % solution Apply 1 Application topically at bedtime. Apply over nail and surrounding skin. Apply daily over previous coat. After seven (7) days, may remove with alcohol and continue cycle.     fluticasone (FLONASE) 50 MCG/ACT nasal spray Place 1-2 sprays each nostril daily. Aim upward and outward. 16 g 5   furosemide (LASIX) 20 MG tablet Take 1 tablet (20 mg total) by mouth daily. 30 tablet 1   gabapentin (NEURONTIN) 100 MG capsule TAKE ONE CAPSULE BY MOUTH THREE TIMES DAILY 90 capsule 2   glipiZIDE (GLUCOTROL XL) 5 MG 24 hr tablet Take 5 mg by mouth 2 (two) times daily.     ipratropium (ATROVENT) 0.03 % nasal spray Place 2 sprays in each nostril up to three times daily as needed for runny nose 30 mL 5   latanoprost (XALATAN) 0.005 % ophthalmic solution Place 1 drop into both eyes at bedtime.     metoprolol succinate (TOPROL-XL) 25 MG 24 hr tablet Take 25 mg by mouth daily.     nitroGLYCERIN (NITROSTAT) 0.4 MG SL tablet Place 1 tablet (0.4 mg total) under the tongue every 5 (five) minutes as needed. 25 tablet 3   pantoprazole (PROTONIX) 40 MG tablet TAKE 1 TABLET BY MOUTH DAILY BEFORE BREAKFAST 30 tablet 3   valsartan (DIOVAN) 160 MG tablet TAKE 1 TABLET BY MOUTH EVERY DAY 90 tablet 3   No current facility-administered medications on file prior to visit.    Social History   Socioeconomic History   Marital status: Widowed    Spouse name: Not on file   Number of children: 2   Years of education: Not on file   Highest education level: Not on file  Occupational History   Not on file  Tobacco Use   Smoking status: Never   Smokeless tobacco: Never  Vaping Use   Vaping status: Never Used  Substance and Sexual Activity   Alcohol use: No    Alcohol/week: 0.0 standard drinks of alcohol   Drug use: No   Sexual activity: Not on file  Other Topics Concern   Not on file   Social History Narrative   Step son lives with her.  She is a widow.   Social Determinants of Health   Financial Resource Strain: Not on file  Food Insecurity: No Food Insecurity (12/05/2022)   Hunger Vital Sign    Worried About Running Out of Food in the Last Year: Never true    Ran Out of Food in the Last Year: Never true  Transportation Needs: No Transportation Needs (12/05/2022)   PRAPARE - Administrator, Civil Service (Medical): No    Lack of Transportation (Non-Medical): No  Physical Activity: Not on file  Stress: Not on file  Social Connections: Not on file  Intimate Partner Violence: Not At Risk (12/05/2022)   Humiliation, Afraid, Rape, and  Kick questionnaire    Fear of Current or Ex-Partner: No    Emotionally Abused: No    Physically Abused: No    Sexually Abused: No    Family History  Problem Relation Age of Onset   Stroke Mother    Cancer Father    Lung cancer Father    Cancer Sister    Diabetes Brother    Diabetes Brother    Diabetes Brother    Pancreatic cancer Neg Hx    Allergic rhinitis Neg Hx    Angioedema Neg Hx    Asthma Neg Hx    Eczema Neg Hx     BP 135/76   Pulse 75   Ht 5\' 6"  (1.676 m)   Wt 164 lb (74.4 kg)   BMI 26.47 kg/m   Body mass index is 26.47 kg/m.      Objective:   Physical Exam Vitals and nursing note reviewed. Exam conducted with a chaperone present.  Constitutional:      Appearance: She is well-developed.  HENT:     Head: Normocephalic and atraumatic.  Eyes:     Conjunctiva/sclera: Conjunctivae normal.     Pupils: Pupils are equal, round, and reactive to light.  Cardiovascular:     Rate and Rhythm: Normal rate and regular rhythm.  Pulmonary:     Effort: Pulmonary effort is normal.  Abdominal:     Palpations: Abdomen is soft.  Musculoskeletal:     Cervical back: Normal range of motion and neck supple.       Feet:  Skin:    General: Skin is warm and dry.  Neurological:     Mental Status: She is  alert and oriented to person, place, and time.     Cranial Nerves: No cranial nerve deficit.     Motor: No abnormal muscle tone.     Coordination: Coordination normal.     Deep Tendon Reflexes: Reflexes are normal and symmetric. Reflexes normal.  Psychiatric:        Behavior: Behavior normal.        Thought Content: Thought content normal.        Judgment: Judgment normal.           Assessment & Plan:   Encounter Diagnosis  Name Primary?   Chronic foot pain, right Yes   I feel she has some tendinitis of the peroneal tendon brevis at the base of the right fifth metatarsal.  I will give CAM walker to use.  She may need repeat X-rays next visit.  She cannot take NSAIDs.  I have suggested Aspercreme, Biofreeze or Voltaren gel.  Return in two weeks.  I can see her in the [place] office then.  Call if any problem.  Precautions discussed.  Electronically Signed Darreld Mclean, MD 7/24/202410:31 AM

## 2023-02-02 DIAGNOSIS — E1165 Type 2 diabetes mellitus with hyperglycemia: Secondary | ICD-10-CM | POA: Diagnosis not present

## 2023-02-02 DIAGNOSIS — I252 Old myocardial infarction: Secondary | ICD-10-CM | POA: Diagnosis not present

## 2023-02-02 DIAGNOSIS — K219 Gastro-esophageal reflux disease without esophagitis: Secondary | ICD-10-CM | POA: Diagnosis not present

## 2023-02-02 DIAGNOSIS — I10 Essential (primary) hypertension: Secondary | ICD-10-CM | POA: Diagnosis not present

## 2023-02-02 DIAGNOSIS — Z6826 Body mass index (BMI) 26.0-26.9, adult: Secondary | ICD-10-CM | POA: Diagnosis not present

## 2023-02-02 DIAGNOSIS — I2510 Atherosclerotic heart disease of native coronary artery without angina pectoris: Secondary | ICD-10-CM | POA: Diagnosis not present

## 2023-02-02 DIAGNOSIS — M109 Gout, unspecified: Secondary | ICD-10-CM | POA: Diagnosis not present

## 2023-02-02 DIAGNOSIS — R251 Tremor, unspecified: Secondary | ICD-10-CM | POA: Diagnosis not present

## 2023-02-02 DIAGNOSIS — Z8673 Personal history of transient ischemic attack (TIA), and cerebral infarction without residual deficits: Secondary | ICD-10-CM | POA: Diagnosis not present

## 2023-02-02 DIAGNOSIS — R0989 Other specified symptoms and signs involving the circulatory and respiratory systems: Secondary | ICD-10-CM | POA: Diagnosis not present

## 2023-02-08 DIAGNOSIS — I10 Essential (primary) hypertension: Secondary | ICD-10-CM | POA: Diagnosis not present

## 2023-02-08 DIAGNOSIS — E1165 Type 2 diabetes mellitus with hyperglycemia: Secondary | ICD-10-CM | POA: Diagnosis not present

## 2023-02-10 DIAGNOSIS — R7989 Other specified abnormal findings of blood chemistry: Secondary | ICD-10-CM | POA: Diagnosis not present

## 2023-02-15 ENCOUNTER — Encounter: Payer: Self-pay | Admitting: Orthopaedic Surgery

## 2023-02-15 ENCOUNTER — Ambulatory Visit (INDEPENDENT_AMBULATORY_CARE_PROVIDER_SITE_OTHER): Payer: HMO | Admitting: Orthopaedic Surgery

## 2023-02-15 VITALS — BP 145/84 | HR 80

## 2023-02-15 DIAGNOSIS — R0989 Other specified symptoms and signs involving the circulatory and respiratory systems: Secondary | ICD-10-CM | POA: Diagnosis not present

## 2023-02-15 DIAGNOSIS — M79671 Pain in right foot: Secondary | ICD-10-CM | POA: Diagnosis not present

## 2023-02-15 DIAGNOSIS — G8929 Other chronic pain: Secondary | ICD-10-CM | POA: Diagnosis not present

## 2023-02-15 NOTE — Progress Notes (Signed)
I am a little better.  She has less pain of the lateral right foot after wearing the CAM walker.  She has no new trauma.  She has been walking better.  Right foot has no swelling. She has a bunion deformity of the great toe on the right.  NV intact.  There is no edema, no redness.  She has no pain over the lateral foot or peroneal area.  Encounter Diagnosis  Name Primary?   Chronic foot pain, right Yes   Gradually come out of the CAM walker.  I can see her in [place] in three weeks.  Call if any problem.  Precautions discussed.  Electronically Signed Darreld Mclean, MD 8/7/202410:26 AM

## 2023-03-02 DIAGNOSIS — S9031XA Contusion of right foot, initial encounter: Secondary | ICD-10-CM | POA: Diagnosis not present

## 2023-03-02 DIAGNOSIS — M79671 Pain in right foot: Secondary | ICD-10-CM | POA: Diagnosis not present

## 2023-03-09 ENCOUNTER — Ambulatory Visit: Payer: HMO | Admitting: Orthopaedic Surgery

## 2023-03-20 IMAGING — CT CT CHEST W/O CM
2 of 4 series · 15 of 36 positions shown, 18 images · non-contrast
Comparison: September 29, 2021 chest radiograph and CT of the chest

CLINICAL DATA: Dyspnea on exertion, abnormal chest radiograph.



[Series 2: routine chest without · axial · non-contrast · 0.64mm/px · z∈[+1395,+1637]mm · 12 of 143 slices shown, 15 images]
[im 11/143  mediastinal]
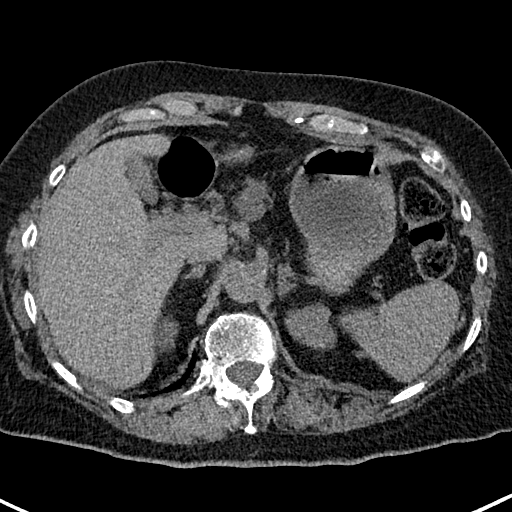
[im 11/143  lung]
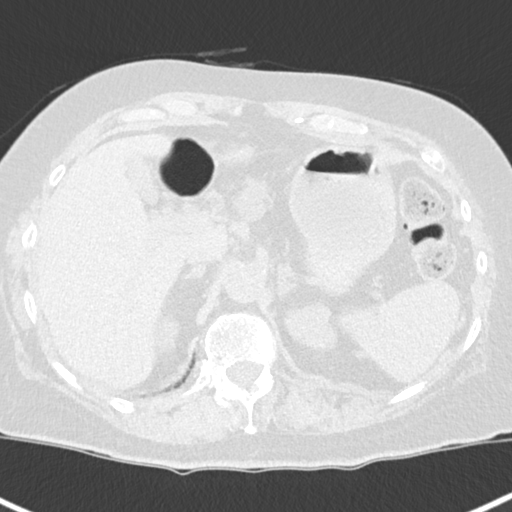
[im 22/143  lung]
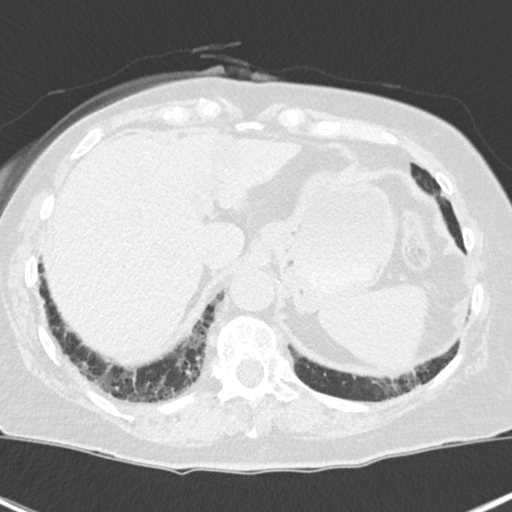
[im 33/143  lung]
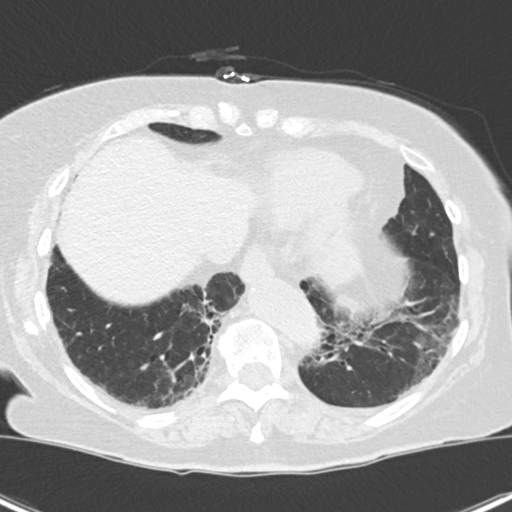
[im 44/143  lung]
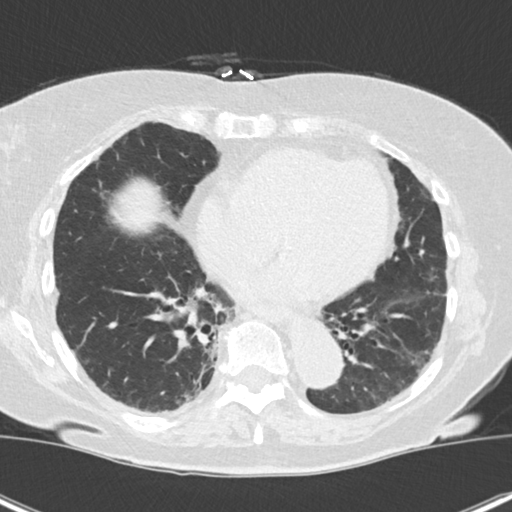
[im 55/143  mediastinal]
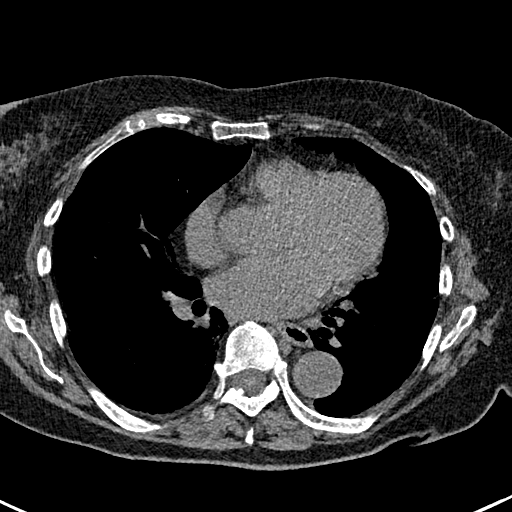
[im 55/143  lung]
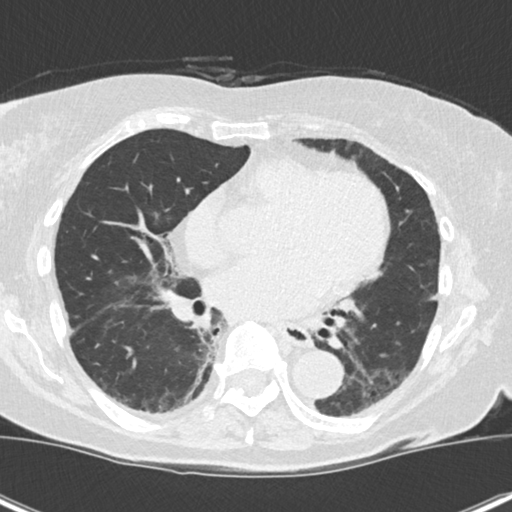
[im 66/143  lung]
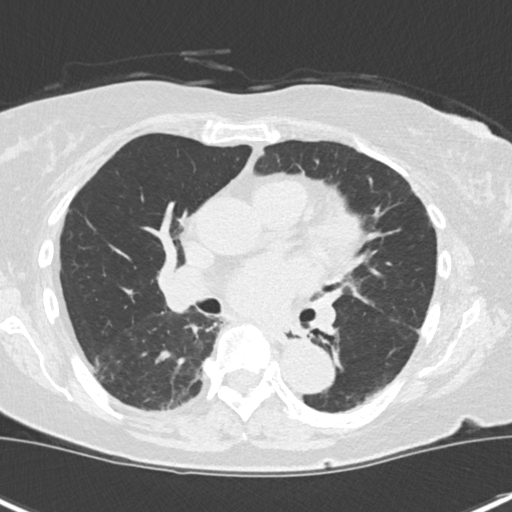
[im 77/143  lung]
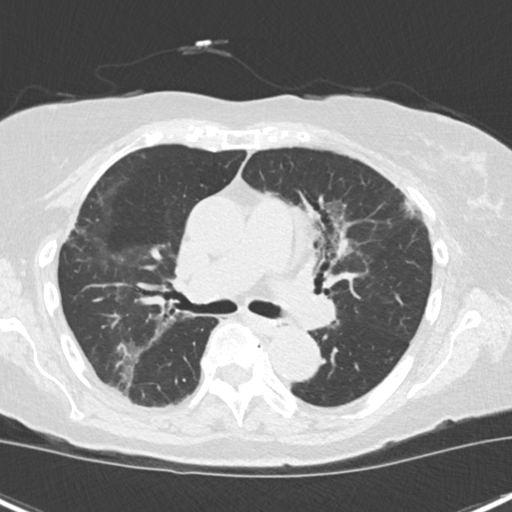
[im 88/143  lung]
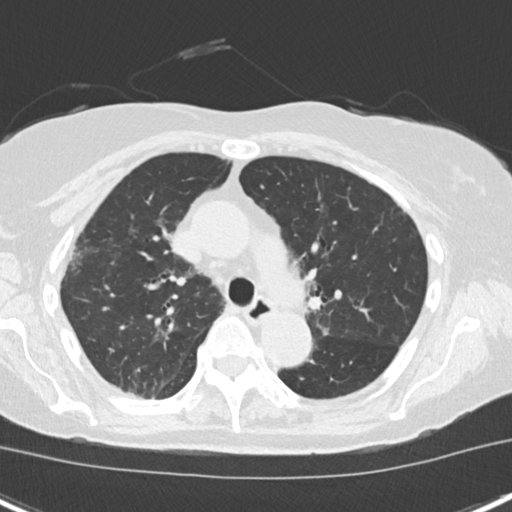
[im 99/143  mediastinal]
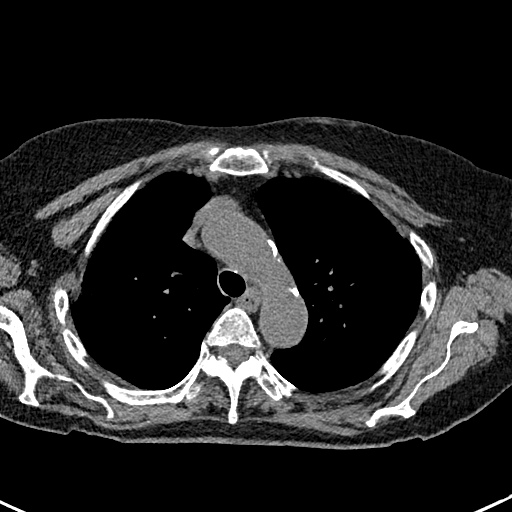
[im 99/143  lung]
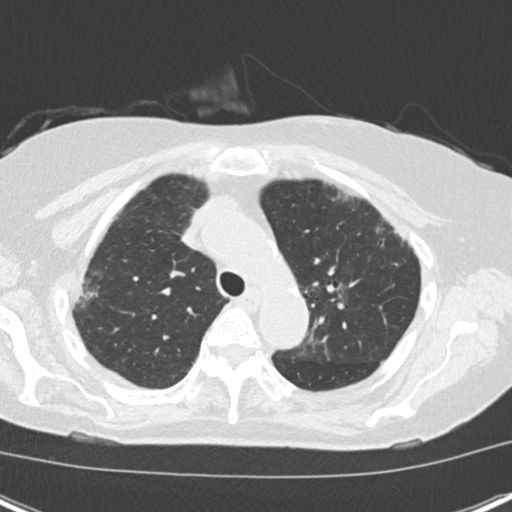
[im 110/143  lung]
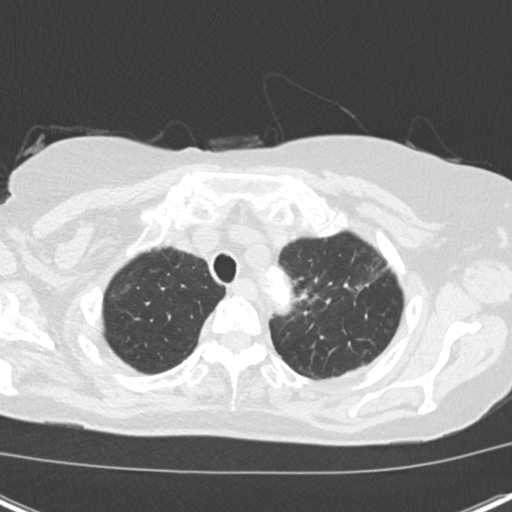
[im 121/143  lung]
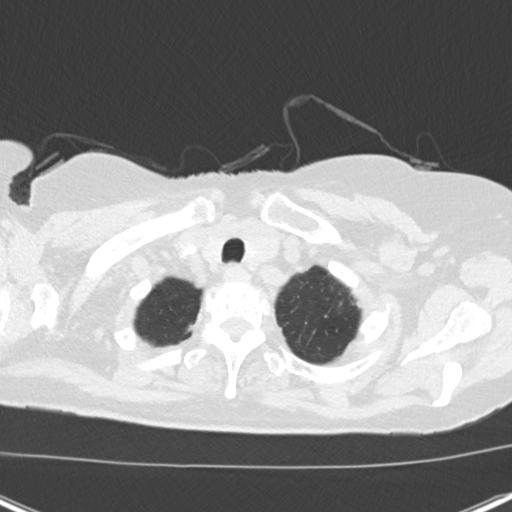
[im 132/143  lung]
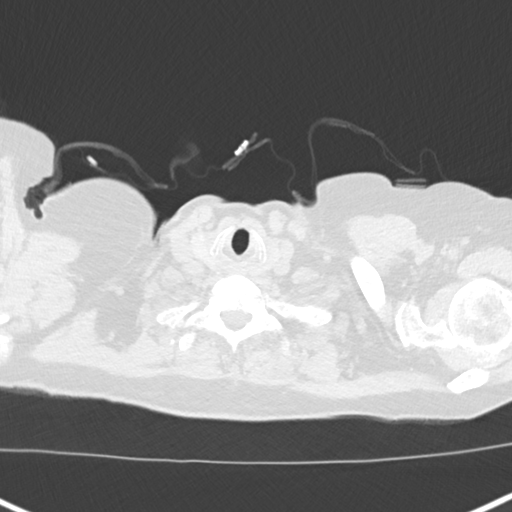

[Series 6: coronal · coronal · 0.59mm/px · 3 of 116 slices shown]
[im 24/116  lung]
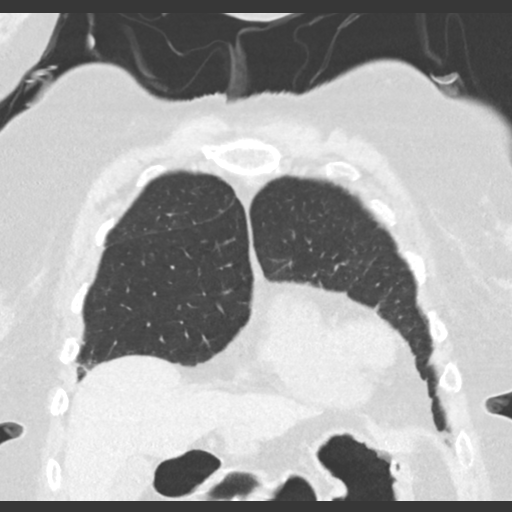
[im 47/116  lung]
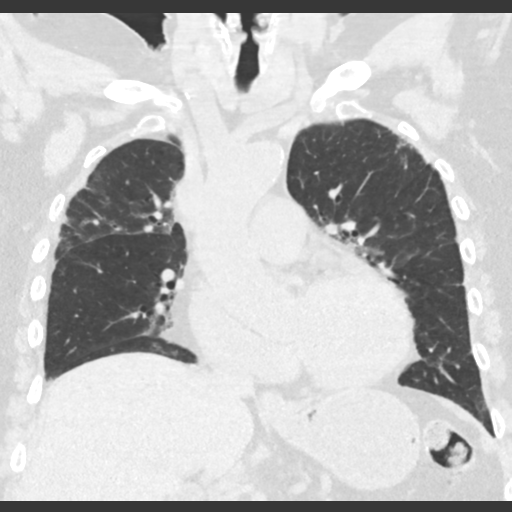
[im 70/116  lung]
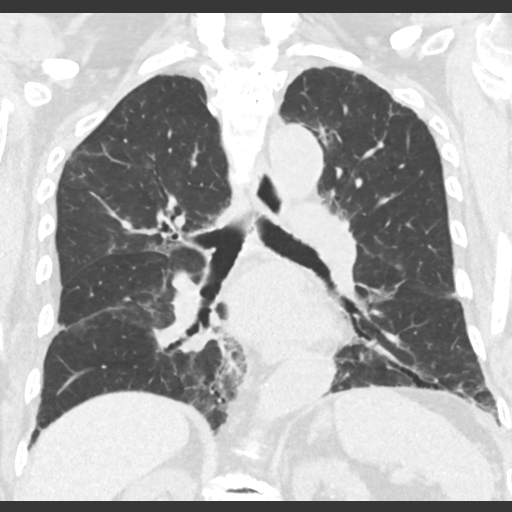

[15 of 36 positions shown; findings below may reference images not displayed]

FINDINGS: Cardiovascular: Calcified atheromatous plaque of the thoracic aorta.
No sign of aneurysm. Normal caliber of central pulmonary vessels.

Top-normal heart size without pericardial effusion or signs of
pericardial thickening.

Mediastinum/Nodes: No thoracic inlet, axillary, mediastinal or hilar
adenopathy. Esophagus grossly normal.

Lungs/Pleura: Scattered areas of subpleural reticulation and
ground-glass. Septal thickening at the lung bases with signs of
bronchiectasis. No signs of honeycombing. Findings mention on
previous imaging which is not currently available for review.
Airways are patent. No consolidation or effusion. No suspicious
pulmonary nodules

Upper Abdomen: Incidental imaging of upper abdominal contents with
no acute process. Well-circumscribed low-density lesion in the LEFT
hepatic lobe may represent a cyst or hemangioma. This previously
measured up to 4.2 cm in 5648 and is therefore more likely a cyst
extending inferiorly from the LEFT hemiliver likely with mild
complexity given 31 Hounsfield unit density. Signs of pancreatic
atrophy. Cystic lesion about the pancreas extending into the lesser
sac measuring 2.7 x 3.2 cm. This previously measured approximately 2
cm greatest axial dimension.

No stranding about the pancreas.  Renal cortical scarring.

Musculoskeletal: No acute bone finding. No destructive bone process.
Spinal degenerative changes.
IMPRESSION: Findings of suspected chronic interstitial lung disease. Findings
are worse when compared to November 15, 2015. Consider pulmonary
consultation as warranted if there are symptoms of shortness of
breath with high-resolution chest CT as indicated.

Enlarging cystic pancreatic lesion extends into the lesser sac and
measures greater than 3 cm. This could represent an intraductal
papillary mucinous neoplasm. Could consider 2 year follow-up or more
aggressive approach with endoscopic ultrasound sampling as
clinically warranted given patient age based on current guidelines.

No suspicious pulmonary nodules.

Aortic atherosclerosis.

Aortic Atherosclerosis ([id number]-[id number].N) and Emphysema ([id number]-GGC.S).

## 2023-03-21 ENCOUNTER — Other Ambulatory Visit: Payer: Self-pay | Admitting: Internal Medicine

## 2023-03-21 NOTE — Telephone Encounter (Signed)
This patient of Dr wert she is requesting a refill he is out the this afternoon and tomorrow,please advise

## 2023-04-03 ENCOUNTER — Encounter: Payer: Self-pay | Admitting: Internal Medicine

## 2023-04-03 ENCOUNTER — Other Ambulatory Visit: Payer: Self-pay | Admitting: Internal Medicine

## 2023-04-03 DIAGNOSIS — R251 Tremor, unspecified: Secondary | ICD-10-CM | POA: Diagnosis not present

## 2023-04-03 DIAGNOSIS — M109 Gout, unspecified: Secondary | ICD-10-CM | POA: Diagnosis not present

## 2023-04-03 DIAGNOSIS — K219 Gastro-esophageal reflux disease without esophagitis: Secondary | ICD-10-CM | POA: Diagnosis not present

## 2023-04-03 DIAGNOSIS — I252 Old myocardial infarction: Secondary | ICD-10-CM | POA: Diagnosis not present

## 2023-04-03 DIAGNOSIS — I2510 Atherosclerotic heart disease of native coronary artery without angina pectoris: Secondary | ICD-10-CM | POA: Diagnosis not present

## 2023-04-03 DIAGNOSIS — Z8673 Personal history of transient ischemic attack (TIA), and cerebral infarction without residual deficits: Secondary | ICD-10-CM | POA: Diagnosis not present

## 2023-04-03 DIAGNOSIS — E1165 Type 2 diabetes mellitus with hyperglycemia: Secondary | ICD-10-CM | POA: Diagnosis not present

## 2023-04-03 DIAGNOSIS — R0989 Other specified symptoms and signs involving the circulatory and respiratory systems: Secondary | ICD-10-CM | POA: Diagnosis not present

## 2023-04-03 DIAGNOSIS — I10 Essential (primary) hypertension: Secondary | ICD-10-CM | POA: Diagnosis not present

## 2023-04-03 DIAGNOSIS — Z6826 Body mass index (BMI) 26.0-26.9, adult: Secondary | ICD-10-CM | POA: Diagnosis not present

## 2023-04-05 DIAGNOSIS — Z6826 Body mass index (BMI) 26.0-26.9, adult: Secondary | ICD-10-CM | POA: Diagnosis not present

## 2023-04-05 DIAGNOSIS — S40029A Contusion of unspecified upper arm, initial encounter: Secondary | ICD-10-CM | POA: Diagnosis not present

## 2023-04-07 ENCOUNTER — Ambulatory Visit: Payer: HMO | Admitting: Nurse Practitioner

## 2023-04-10 ENCOUNTER — Encounter: Payer: HMO | Attending: Physician Assistant | Admitting: Nutrition

## 2023-04-10 ENCOUNTER — Encounter: Payer: Self-pay | Admitting: Nutrition

## 2023-04-10 VITALS — Ht 67.0 in | Wt 163.0 lb

## 2023-04-10 DIAGNOSIS — E162 Hypoglycemia, unspecified: Secondary | ICD-10-CM | POA: Diagnosis not present

## 2023-04-10 DIAGNOSIS — E1165 Type 2 diabetes mellitus with hyperglycemia: Secondary | ICD-10-CM | POA: Insufficient documentation

## 2023-04-10 DIAGNOSIS — G459 Transient cerebral ischemic attack, unspecified: Secondary | ICD-10-CM

## 2023-04-10 DIAGNOSIS — Z6825 Body mass index (BMI) 25.0-25.9, adult: Secondary | ICD-10-CM | POA: Diagnosis not present

## 2023-04-10 DIAGNOSIS — E118 Type 2 diabetes mellitus with unspecified complications: Secondary | ICD-10-CM

## 2023-04-10 DIAGNOSIS — I10 Essential (primary) hypertension: Secondary | ICD-10-CM

## 2023-04-10 DIAGNOSIS — Z713 Dietary counseling and surveillance: Secondary | ICD-10-CM | POA: Insufficient documentation

## 2023-04-10 DIAGNOSIS — N184 Chronic kidney disease, stage 4 (severe): Secondary | ICD-10-CM

## 2023-04-10 NOTE — Progress Notes (Signed)
Medical Nutrition Therapy  Appointment Start time:  1030  Appointment End time:  1130  Primary concerns today: Dm Type 2  Referral diagnosis: E11.8 Preferred learning style: No Preference  Learning readiness: Ready    NUTRITION ASSESSMENT  88 yr old Hispanic female that has been referred for Type 2 DM. Currently seeing Roma Kayser, PA at [facility]. Just established care with her. Previous provider was Dr. Sherril Croon. A1C 8.6%. Currently taking Glipizide 5 mg BID. Testing BS twice a day. Wants to buy a CGM over the counter to help her see what her blood sugars are doing. Admits to drinking some juice, 1/2 and 1/2 tea, gatorade, eating some sweets at times and goes out to eat some. Has had a CVA/TIA 2 years ago. Walks with a cane for balance. CAD- on medications. Had GOUT and got treated. CKD Stg 4.  Creatine 1.69 mg/dl and eGFR is 29. Hasn't seen a nephrologist yet. She didn't know she had CKD until recently when she changed MD's.   She is willing work on LIfestyle Medicine and focusing on a more whole plant based diet with the 6 pillars of health to improve her health and reduce risks of other health issues.   Clinical Medical Hx:  Past Medical History:  Diagnosis Date   CAD (coronary artery disease)    a. s/p NSTEMI in 07/2021 with cath showing minimal CAD and no culprit lesion identified   Constipation    Cramps, extremity    legs and hands   Diabetes (HCC)    type II   Enterococcus UTI 02/03/2016   H/O cardiovascular stress test 03/05/2009   normal, no ischemia   H/O Doppler ultrasound    lower ext arterial doppler 04/13/09-no evidence of arterial insufficiency, normal values; distal aorta 2.4x2.7   HTN (hypertension)    Murmur, cardiac    echo 02/25/13- EF 60-65%, impaired relaxation-grade 1 diastolic dysfunction, mild concentric lvh, mild to moderate aortic regurgitation   Myocardial infarction [facility])    january   Pancreatic cyst    Pneumonia    May 2017    Postoperative anemia due to acute blood loss 02/02/2016   Primary localized osteoarthritis of right knee    Recurrent upper respiratory infection (URI)    TIA (transient ischemic attack)    carotid doppler 05/08/11-normal patency    Medications:  Current Outpatient Medications on File Prior to Visit  Medication Sig Dispense Refill   allopurinol (ZYLOPRIM) 100 MG tablet Take 1 tablet (100 mg total) by mouth daily. 30 tablet 1   aspirin EC 81 MG tablet Take 81 mg by mouth daily. Swallow whole.     atorvastatin (LIPITOR) 80 MG tablet Take 80 mg by mouth daily.     ciclopirox (PENLAC) 8 % solution Apply 1 Application topically at bedtime. Apply over nail and surrounding skin. Apply daily over previous coat. After seven (7) days, may remove with alcohol and continue cycle.     furosemide (LASIX) 20 MG tablet Take 1 tablet (20 mg total) by mouth daily. 30 tablet 1   gabapentin (NEURONTIN) 100 MG capsule TAKE ONE CAPSULE BY MOUTH THREE TIMES DAILY 90 capsule 2   glipiZIDE (GLUCOTROL XL) 5 MG 24 hr tablet Take 5 mg by mouth 2 (two) times daily.     ipratropium (ATROVENT) 0.03 % nasal spray Place 2 sprays in each nostril up to three times daily as needed for runny nose 30 mL 5   latanoprost (XALATAN) 0.005 % ophthalmic solution Place 1  drop into both eyes at bedtime.     metoprolol succinate (TOPROL-XL) 25 MG 24 hr tablet Take 25 mg by mouth daily.     pantoprazole (PROTONIX) 40 MG tablet TAKE 1 TABLET BY MOUTH DAILY BEFORE BREAKFAST 30 tablet 3   valsartan (DIOVAN) 160 MG tablet TAKE 1 TABLET BY MOUTH EVERY DAY 90 tablet 3   fluticasone (FLONASE) 50 MCG/ACT nasal spray Place 1-2 sprays each nostril daily. Aim upward and outward. 16 g 5   nitroGLYCERIN (NITROSTAT) 0.4 MG SL tablet DISSOLVE 1 TABLET UNDER THE TONGUE EVERY 5 MINUTES AS NEEDED FOR CHEST PAIN. DO NOT EXCEED A TOTAL OF 3 DOSES IN 15 MINUTES. 25 tablet 3   No current facility-administered medications on file prior to visit.    Labs: A1C  8.6%. Creat 1.69 mg/dl, eGFR 29, ALK PHOS 762.  Notable Signs/Symptoms: None  Lifestyle & Dietary Hx LIves by herself and still drives. Eats out some and eats at home  Estimated daily fluid intake: 40-60 oz Supplements:  Sleep: good Stress / self-care:  Current average weekly physical activity: ADL's.  24-Hr Dietary Recall First Meal: 9 am  rasin bran with banana or frozen fruit, coffee with creamer and small glass with OJ. Snack: grapes, or apple Second Meal: 3 pm Salad- ice berg with broccoli, cauflowerd, tomatoes, carrots,  raspberry viengarette, water Snack:  Third Meal:  7pm  Salad leftovers, vinegareette no protein sources, Snack:  Beverages: 1/2 tea, diet sodas, gatorade zero, water  Estimated Energy Needs Calories: 1200-1500 Carbohydrate: 170g Protein: 112g Fat: 42g   NUTRITION DIAGNOSIS  NB-1.1 Food and nutrition-related knowledge deficit As related to Type 2 DM and CKD.  As evidenced by A1C 8.6% and Creatine 1.69 mg/dl..   NUTRITION INTERVENTION  Nutrition education (E-1) on the following topics:  Nutrition and Diabetes education provided on My Plate, CHO counting, meal planning, portion sizes, timing of meals, avoiding snacks between meals unless having a low blood sugar, target ranges for A1C and blood sugars, signs/symptoms and treatment of hyper/hypoglycemia, monitoring blood sugars, taking medications as prescribed, benefits of exercising 30 minutes per day and prevention of complications of DM.  Lifestyle Medicine  - Whole Food, Plant Predominant Nutrition is highly recommended: Eat Plenty of vegetables, Mushrooms, fruits, Legumes, Whole Grains, Nuts, seeds in lieu of processed meats, processed snacks/pastries red meat, poultry, eggs.    -It is better to avoid simple carbohydrates including: Cakes, Sweet Desserts, Ice Cream, Soda (diet and regular), Sweet Tea, Candies, Chips, Cookies, Store Bought Juices, Alcohol in Excess of  1-2 drinks a day, Lemonade,   Artificial Sweeteners, Doughnuts, Coffee Creamers, "Sugar-free" Products, etc, etc.  This is not a complete list.....  Exercise: If you are able: 30 -60 minutes a day ,4 days a week, or 150 minutes a week.  The longer the better.  Combine stretch, strength, and aerobic activities.  If you were told in the past that you have high risk for cardiovascular diseases, you may seek evaluation by your heart doctor prior to initiating moderate to intense exercise programs.   Handouts Provided Include  Lifestyle Medicine Handouts Know your numbers  Learning Style & Readiness for Change Teaching method utilized: Visual & Auditory  Demonstrated degree of understanding via: Teach Back  Barriers to learning/adherence to lifestyle change: None  Goals Established by Pt Goals  Cut out sweets, juices and processed foods, fast foods Eat three meals per day Avoid cheese Drink only water. No juice or tea, Gatorade  Measure foods out Focus on  whole plant based foods. Ask for referral to kidney specialist. Get A1C down to 7%. Look into getting a Stelo CGM    MONITORING & EVALUATION Dietary intake, weekly physical activity, and BS's in 1 month.  Recommend to refer to Nephrologist for her CKD.  Next Steps  Patient is to work on meal planning and cutting out foods with sugar.Marland Kitchen

## 2023-04-10 NOTE — Patient Instructions (Signed)
Goals  Cut out sweets, juices and processed foods, fast foods Eat three meals per day Avoid cheese Drink only water. No juice or tea, Gatorade  Measure foods out Focus on whole plant based foods. Ask for referral to kidney specialist. Get A1C down to 7%. Look into getting a Stelo CGM

## 2023-04-13 ENCOUNTER — Telehealth: Payer: Self-pay | Admitting: Internal Medicine

## 2023-04-13 NOTE — Telephone Encounter (Signed)
She is due for routine follow-up but doesn't have to be seen in the office prior to the MRI. May be better to see her after the MRI so we can discuss the MRI results further if needed.

## 2023-04-13 NOTE — Telephone Encounter (Signed)
[name], please advise if patient should come in prior? Looks like also rec 6-12 months for repeat MRI. Thanks !

## 2023-04-13 NOTE — Telephone Encounter (Signed)
Patient left a message that she got a letter to schedule MRI/MRCP with and without contrast

## 2023-04-23 NOTE — Progress Notes (Unsigned)
[name], female    DOB: 1935-05-02    MRN: [id number]   Brief patient profile:  75 yowf never smoker with allergic rhinitis worse in spring referred to pulmonary clinic in [place]  01/13/2022 by Selmer Dominion for sob > cough   Prior eval and rx by Dr Celine Mans for sob and cough x Nov 2020 (says with covid, not admitted)  assoc with pnds no better with prednisone and never saw allergy / initially otc antihistamines were helping  but not anymore    History of Present Illness  01/13/2022  Pulmonary/ 1st office eval/ Sherene Sires / [facility]  Chief Complaint  Patient presents with   New Patient (Initial Visit)    Was seeing Dr. Cherre Huger in [facility]. Patient states she was seeing him for cough and SOB.  Dyspnea:  assoc with cough since  Nov 2020 / says could only do "3 laps" at end of cardiac  rehab was supposed to do 6 - walking one side of house to the other (rehab notes say she did 800 feet in 6 min with lowest sats 90% on 12/17/21 last session)  Cough: typically worse p walks/ very small amt mucoid sputum, worse with voice use  Sleep: on back/side / bed is flat does not wake up with excess mucus  SABA use: none   Rec Pantoprazole (protonix) 40 mg   Take  30-60 min before first meal of the day and Pepcid (famotidine)  20 mg after supper  GERD  diet/bed blocks Labs: Eos 0.1 /  IgE 256  > referred to allergy > does not recall being referred)  Esr  48    02/23/2022  f/u ov/[place] office/Lawsyn Heiler re: onset doe Nov 2020 p covid / bronchiectasis on CT   maint on gerd rx   Chief Complaint  Patient presents with   Follow-up    Breathing and cough are about the same since last ov  Having a lot of foot pain in right foot.   Dyspnea:  now  pushing buggy at [employer], using [facility] parking once a week  Cough: some cough at hs but does not keep her up, some worse p stirring in am/ with deep breaths or signing  / still using cough drops  Allegra didn't help  Sleeping: bed is flat with pillows   SABA use: none  02: none  Covid status: x 4 vax  Rec Make sure you check your oxygen saturation at your highest level of activity to be sure it stays over 90% .  Add zyrtec 10 mg an hour before bed (can take it same time famotidine)  My office will be contacting you by phone for referral to Allergy   GERD rx     -  HRCT  08/30/22 slightly worsened since 12/10/2021 chest CT. Findings are categorized as probable UIP> f/u serially in office with walking sats    10/24/2022  f/u ov/[place] office/Benisha Hadaway re: UIP maint on pepcid only   Chief Complaint  Patient presents with   Follow-up    Pt f/u for chronic cough verbalized her breathing is baseline and does not use oxygen  Dyspnea:  still doing grocery store / walking with cane / not checking sats as rec  Cough: better on only pepcid  Sleeping: no problem flat bed, no pillows  SABA use: none  02: none   Rec Famotidine should be 20 mg tablets one after bfast and one after supper  Continue gabapentin 100 mg three times  daily  Make sure you check your oxygen saturation at your highest level of activity(NOT after you stop)  to be sure it stays over 90%       04/24/2023  60m  f/u ov/[place] office/Eyad Rochford re: onset doe Nov 2020 p covid / bronchiectasis ? UIP on CT  maint on no longer on gerd rx/ not really sure of meds    Chief Complaint  Patient presents with   UIP (usual interstitial pneumonitis)    Dyspnea:  does treadmill until L knee gives out, slt elevation maybe 5 min, not checking sats  Cough: started back up in last month sporadic day >> noct, not with exertion  Sleeping: bed is flat/ one pillow s resp cc  SABA use: none  02: none      No obvious day to day or daytime variability or assoc excess/ purulent sputum or mucus plugs or hemoptysis or cp or chest tightness, subjective wheeze or overt sinus or hb symptoms.    Also denies any obvious fluctuation of symptoms with weather or environmental changes or other aggravating  or alleviating factors except as outlined above   No unusual exposure hx or h/o childhood pna/ asthma or knowledge of premature birth.  Current Allergies, Complete Past Medical History, Past Surgical History, Family History, and Social History were reviewed in [employer] record.  ROS  The following are not active complaints unless bolded Hoarseness, sore throat, dysphagia, dental problems, itching, sneezing,  nasal congestion or discharge of excess mucus or purulent secretions, ear ache,   fever, chills, sweats, unintended wt loss or wt gain, classically pleuritic or exertional cp,  orthopnea pnd or arm/hand swelling  or leg swelling, presyncope, palpitations, abdominal pain, anorexia, nausea, vomiting, diarrhea  or change in bowel habits or change in bladder habits, change in stools or change in urine, dysuria, hematuria,  rash, arthralgias, visual complaints, headache, numbness, weakness or ataxia or problems with walking/ uses cane  or coordination,  change in mood or  memory.        Current Meds - - NOTE:   Unable to verify as accurately reflecting what pt takes    Medication Sig   allopurinol (ZYLOPRIM) 100 MG tablet Take 1 tablet (100 mg total) by mouth daily.   aspirin EC 81 MG tablet Take 81 mg by mouth daily. Swallow whole.   atorvastatin (LIPITOR) 80 MG tablet Take 80 mg by mouth daily.   ciclopirox (PENLAC) 8 % solution Apply 1 Application topically at bedtime. Apply over nail and surrounding skin. Apply daily over previous coat. After seven (7) days, may remove with alcohol and continue cycle.   colchicine 0.6 MG tablet Take 0.6 mg by mouth daily.   fluticasone (FLONASE) 50 MCG/ACT nasal spray Place 1-2 sprays each nostril daily. Aim upward and outward.   furosemide (LASIX) 20 MG tablet Take 1 tablet (20 mg total) by mouth daily.   gabapentin (NEURONTIN) 100 MG capsule TAKE ONE CAPSULE BY MOUTH THREE TIMES DAILY   glipiZIDE (GLUCOTROL XL) 5 MG 24 hr tablet Take  5 mg by mouth 2 (two) times daily.   ipratropium (ATROVENT) 0.03 % nasal spray Place 2 sprays in each nostril up to three times daily as needed for runny nose   latanoprost (XALATAN) 0.005 % ophthalmic solution Place 1 drop into both eyes at bedtime.   metoprolol succinate (TOPROL-XL) 25 MG 24 hr tablet Take 25 mg by mouth daily.   nitroGLYCERIN (NITROSTAT) 0.4 MG SL tablet DISSOLVE 1 TABLET  UNDER THE TONGUE EVERY 5 MINUTES AS NEEDED FOR CHEST PAIN. DO NOT EXCEED A TOTAL OF 3 DOSES IN 15 MINUTES.   pantoprazole (PROTONIX) 40 MG tablet TAKE 1 TABLET BY MOUTH DAILY BEFORE BREAKFAST   valsartan (DIOVAN) 160 MG tablet TAKE 1 TABLET BY MOUTH EVERY DAY        Past Medical History:  Diagnosis Date   CAD (coronary artery disease)    a. s/p NSTEMI in 07/2021 with cath showing minimal CAD and no culprit lesion identified   Cramps, extremity    legs and hands   Diabetes (HCC)    type II   Enterococcus UTI 02/03/2016   H/O cardiovascular stress test 03/05/2009   normal, no ischemia   H/O Doppler ultrasound    lower ext arterial doppler 04/13/09-no evidence of arterial insufficiency, normal values; distal aorta 2.4x2.7   HTN (hypertension)    Murmur, cardiac    echo 02/25/13- EF 60-65%, impaired relaxation-grade 1 diastolic dysfunction, mild concentric lvh, mild to moderate aortic regurgitation   Pneumonia    May 2017   Postoperative anemia due to acute blood loss 02/02/2016   Primary localized osteoarthritis of right knee    TIA (transient ischemic attack)    carotid doppler 05/08/11-normal patency        Objective:    Wts  04/24/2023     167  10/24/2022       165  07/13/2022         163  02/23/22 163 lb 12.8 oz (74.3 kg)  01/13/22 163 lb (73.9 kg)  01/13/22 163 lb (73.9 kg)     Vital signs reviewed  04/24/2023  - Note at rest 02 sats  92% on RA   General appearance:    elderly wf easily confused with details of care     HEENT : Oropharynx  clear         NECK :  without   apparent JVD/ palpable Nodes/TM    LUNGS: no acc muscle use,  Nl contour chest with trace crackles both bases    CV:  RRR  no s3 or murmur or increase in P2, and no edema   ABD:  soft and nontender with nl inspiratory excursion in the supine position. No bruits or organomegaly appreciated   MS:  Nl gait/ ext warm without deformities Or obvious joint restrictions  calf tenderness, cyanosis or clubbing    SKIN: warm and dry without lesions    NEURO:  alert, approp, nl sensorium with  no motor or cerebellar deficits apparent.          Assessment

## 2023-04-24 ENCOUNTER — Ambulatory Visit (INDEPENDENT_AMBULATORY_CARE_PROVIDER_SITE_OTHER): Payer: HMO | Admitting: Internal Medicine

## 2023-04-24 ENCOUNTER — Encounter: Payer: Self-pay | Admitting: Internal Medicine

## 2023-04-24 VITALS — BP 131/75 | HR 72 | Ht 67.0 in | Wt 167.0 lb

## 2023-04-24 DIAGNOSIS — J84112 Idiopathic pulmonary fibrosis: Secondary | ICD-10-CM

## 2023-04-24 DIAGNOSIS — M160 Bilateral primary osteoarthritis of hip: Secondary | ICD-10-CM | POA: Insufficient documentation

## 2023-04-24 DIAGNOSIS — R0609 Other forms of dyspnea: Secondary | ICD-10-CM

## 2023-04-24 DIAGNOSIS — K219 Gastro-esophageal reflux disease without esophagitis: Secondary | ICD-10-CM | POA: Diagnosis not present

## 2023-04-24 DIAGNOSIS — R053 Chronic cough: Secondary | ICD-10-CM | POA: Diagnosis not present

## 2023-04-24 DIAGNOSIS — M8580 Other specified disorders of bone density and structure, unspecified site: Secondary | ICD-10-CM | POA: Insufficient documentation

## 2023-04-24 DIAGNOSIS — I700 Atherosclerosis of aorta: Secondary | ICD-10-CM | POA: Insufficient documentation

## 2023-04-24 DIAGNOSIS — E1121 Type 2 diabetes mellitus with diabetic nephropathy: Secondary | ICD-10-CM | POA: Insufficient documentation

## 2023-04-24 MED ORDER — PANTOPRAZOLE SODIUM 40 MG PO TBEC
40.0000 mg | DELAYED_RELEASE_TABLET | Freq: Every day | ORAL | 11 refills | Status: AC
Start: 2023-04-24 — End: ?

## 2023-04-24 MED ORDER — FAMOTIDINE 20 MG PO TABS
ORAL_TABLET | ORAL | 11 refills | Status: DC
Start: 2023-04-24 — End: 2023-09-21

## 2023-04-24 NOTE — Assessment & Plan Note (Signed)
Onset Nov 2020 assoc with sensation of pnds but not better on antihistamines or steroids - assoc bronchiectasis on CT 12/10/21  - Allergy screen 01/13/2022 >  Eos 0.1 /  IgE 256  > referred to allergy 02/23/2022 >>> again 07/13/2022  - added gabapentin trial 07/13/2022  @ 100 mg tid if tol > not clearly taking it as of 04/24/2023   Not really sure what she's taking at this point but since also has possible UIP I rec resume GERD Use of PPI is associated with improved survival time and with decreased radiologic fibrosis per King's study published in AJRCCM vol 184 p1390.  Dec 2011 and also may have other beneficial effects as per the latest review in [place] vol 193 p1345 Jun 20016.    F/u in this clinic can be prn   Each maintenance medication was reviewed in detail including emphasizing most importantly the difference between maintenance and prns and under what circumstances the prns are to be triggered using an action plan format where appropriate.  Total time for H and P, chart review, counseling,  directly observing portions of ambulatory 02 saturation study/ and generating customized AVS unique to this summary final office visit / same day charting > 30 min for multiple  refractory respiratory  symptoms of uncertain etiology

## 2023-04-24 NOTE — Assessment & Plan Note (Signed)
Onset nov 2020 (? covid related)  - See HRCT  08/30/22 slightly worsened since 12/10/2021 chest CT. Findings are categorized as probable UIP  - 10/24/2022   Walked on RA  x  2  lap(s) =  approx 300  ft  @ mod pace, stopped due to hip pain  with lowest 02 sats 90%  and no sob  - 04/24/2023   Walked on RA  x  2  lap(s) =  approx 300  ft  @ mod pace, stopped due to knee and hip pain with lowest 02 sats 92%    No evidence of progression which would favor post ALI PF but HRCT favors UIP and pt now willing to go to [place] so referred to PF clinic.

## 2023-04-24 NOTE — Patient Instructions (Addendum)
Resume Pantoprazole (protonix) 40 mg   Take  30-60 min before first meal of the day and Pepcid (famotidine)  20 mg after supper   Make sure you check your oxygen saturation at your highest level of activity(NOT after you stop)  to be sure it stays over 90% and keep track of it at least once a week, more often if breathing getting worse, and let me know if losing ground. (Collect the dots to connect the dots approach)     My office will be contacting you by phone for referral to Pulmonary fibrosis clinic   - if you don't hear back from my office within one week please call us back or notify us thru MyChart and we'll address it right away.     Pulmonary follow up in this clinic is as needed

## 2023-05-03 ENCOUNTER — Telehealth: Payer: Self-pay | Admitting: Internal Medicine

## 2023-05-03 NOTE — Telephone Encounter (Signed)
PT said Dr. Sherene Sires was going to sched her an MRI but I see nothing of thison the AVS and no ordered for MRI in "Active Requests" Tab.  I do see this:  My office will be contacting you by phone for referral to Pulmonary fibrosis clinic   - if you don't hear back from my office within one week please call us back or notify us thru MyChart and we'll address it right away.     Maybe pt is confused. Please call her to advise @ [phone]

## 2023-05-03 NOTE — Telephone Encounter (Signed)
Lm for patient.  

## 2023-05-04 NOTE — Telephone Encounter (Signed)
Spoke with patient and advised. She is aware someone will contact her to schedule visit with MR. Nothing further needed at this time.

## 2023-05-24 ENCOUNTER — Ambulatory Visit: Payer: HMO | Admitting: Nutrition

## 2023-05-31 DIAGNOSIS — R059 Cough, unspecified: Secondary | ICD-10-CM | POA: Diagnosis not present

## 2023-05-31 DIAGNOSIS — Z6827 Body mass index (BMI) 27.0-27.9, adult: Secondary | ICD-10-CM | POA: Diagnosis not present

## 2023-05-31 DIAGNOSIS — J019 Acute sinusitis, unspecified: Secondary | ICD-10-CM | POA: Diagnosis not present

## 2023-06-04 NOTE — Progress Notes (Unsigned)
Referring Provider: Ignatius Specking, MD Primary Care Physician:  Ignatius Specking, MD Primary GI Physician: Dr. Marletta Lor  No chief complaint on file.   HPI:   [name] is a 88 y.o. female with history of CAD, NSTEMI January 2023, T2DM, HTN, TIA, GERD, dysphagia, constipation, hepatic cyst, pancreatic cyst s/p, presenting today for follow-up ***  Regarding her pancreatic cyst, she underwent EUS/FNA September 2023.  Cytology suggested mucinous cystic neoplasm or potentially IPMN. Final Interpace Pancreagen fluid analysis studies showing statistically higher risk cyst is noted based on this final testing.  CA 19-9 was normal October 2023.  Dr. Meridee Score discussed at [facility]. Ultimately recommended close surveillance with MRI/MRCP at 66-month interval from EUS (scheduled for tomorrow).  If no dramatic change, then 40-month follow-up, and if stable, then 1 year surveillance thereafter.   Last seen in the office 10/12/2022.  GERD not adequately managed on famotidine 20 mg nightly.  Recommended starting pantoprazole daily.  Dysphagia symptoms fairly stable, only having trouble if eating meat too quickly.  She was not interested in pursuing manometry/further evaluation.  Chronic constipation not adequately managed as she was not taking anything.  Recommended starting Colace 100-200 mg daily, increase water and fiber intake.  She had upcoming MRI for pancreatic cyst.  MRI/MRCP 10/13/2022: IMPRESSION: 1. Unchanged lobulated, thinly septated fluid signal cystic lesion arising from the superior aspect of the pancreatic neck measuring 3.2 x 2.8 cm. No solid component or suspicious contrast enhancement. This is most consistent with a pseudocyst or side branch IPMN and is almost certainly benign. Given advanced patient age and established initial stability, one additional surveillance MR can be considered in 2 years if clinically appropriate. 2. Unchanged, definitively benign liver cysts, for which no  further follow-up or characterization is required. 3. Cholelithiasis. No evidence of acute cholecystitis or biliary dilatation. 4. Cardiomegaly.  Dr. Meridee Score recommended MRI/MRCP in 6-12 months with primary GI.  Today:  GERD:  Constipation:   Pancreatic cyst:   Past Medical History:  Diagnosis Date   CAD (coronary artery disease)    a. s/p NSTEMI in 07/2021 with cath showing minimal CAD and no culprit lesion identified   Constipation    Cramps, extremity    legs and hands   Diabetes (HCC)    type II   Enterococcus UTI 02/03/2016   H/O cardiovascular stress test 03/05/2009   normal, no ischemia   H/O Doppler ultrasound    lower ext arterial doppler 04/13/09-no evidence of arterial insufficiency, normal values; distal aorta 2.4x2.7   HTN (hypertension)    Murmur, cardiac    echo 02/25/13- EF 60-65%, impaired relaxation-grade 1 diastolic dysfunction, mild concentric lvh, mild to moderate aortic regurgitation   Myocardial infarction [facility])    january   Pancreatic cyst    Pneumonia    May 2017   Postoperative anemia due to acute blood loss 02/02/2016   Primary localized osteoarthritis of right knee    Recurrent upper respiratory infection (URI)    TIA (transient ischemic attack)    carotid doppler 05/08/11-normal patency    Past Surgical History:  Procedure Laterality Date   ABDOMINAL HYSTERECTOMY     BIOPSY  03/31/2022   Procedure: BIOPSY;  Surgeon: Lemar Lofty., MD;  Location: [facility];  Service: Gastroenterology;;   BUNIONECTOMY Left    COLONOSCOPY  04/29/2010   Dr. Matthias Hughs; diverticulosis in the entire colon, otherwise normal exam. Recommended repeat colonoscopy in 5 years for surveillance due to personal history of polyps.  ESOPHAGOGASTRODUODENOSCOPY (EGD) WITH PROPOFOL N/A 03/31/2022   Procedure: ESOPHAGOGASTRODUODENOSCOPY (EGD) WITH PROPOFOL;  Surgeon: Meridee Score Netty Starring., MD;  Location: [facility];  Service: Gastroenterology;   Laterality: N/A;   EUS N/A 03/31/2022   Procedure: UPPER ENDOSCOPIC ULTRASOUND (EUS) LINEAR;  Surgeon: Lemar Lofty., MD;  Location: [facility];  Service: Gastroenterology;  Laterality: N/A;   FINE NEEDLE ASPIRATION  03/31/2022   Procedure: FINE NEEDLE ASPIRATION (FNA) LINEAR;  Surgeon: Lemar Lofty., MD;  Location: [facility];  Service: Gastroenterology;;   Gaspar Bidding DILATION N/A 03/31/2022   Procedure: Gaspar Bidding DILATION;  Surgeon: Lemar Lofty., MD;  Location: [facility];  Service: Gastroenterology;  Laterality: N/A;   TOTAL KNEE ARTHROPLASTY Right 02/01/2016   Procedure: TOTAL KNEE ARTHROPLASTY;  Surgeon: Salvatore Marvel, MD;  Location: [facility] OR;  Service: Orthopedics;  Laterality: Right;    Current Outpatient Medications  Medication Sig Dispense Refill   allopurinol (ZYLOPRIM) 100 MG tablet Take 1 tablet (100 mg total) by mouth daily. 30 tablet 1   aspirin EC 81 MG tablet Take 81 mg by mouth daily. Swallow whole.     atorvastatin (LIPITOR) 80 MG tablet Take 80 mg by mouth daily.     ciclopirox (PENLAC) 8 % solution Apply 1 Application topically at bedtime. Apply over nail and surrounding skin. Apply daily over previous coat. After seven (7) days, may remove with alcohol and continue cycle.     colchicine 0.6 MG tablet Take 0.6 mg by mouth daily.     famotidine (PEPCID) 20 MG tablet One after supper 30 tablet 11   fluticasone (FLONASE) 50 MCG/ACT nasal spray Place 1-2 sprays each nostril daily. Aim upward and outward. 16 g 5   furosemide (LASIX) 20 MG tablet Take 1 tablet (20 mg total) by mouth daily. 30 tablet 1   gabapentin (NEURONTIN) 100 MG capsule TAKE ONE CAPSULE BY MOUTH THREE TIMES DAILY 90 capsule 2   glipiZIDE (GLUCOTROL XL) 5 MG 24 hr tablet Take 5 mg by mouth 2 (two) times daily.     ipratropium (ATROVENT) 0.03 % nasal spray Place 2 sprays in each nostril up to three times daily as needed for runny nose 30 mL 5   latanoprost (XALATAN) 0.005 %  ophthalmic solution Place 1 drop into both eyes at bedtime.     metoprolol succinate (TOPROL-XL) 25 MG 24 hr tablet Take 25 mg by mouth daily.     nitroGLYCERIN (NITROSTAT) 0.4 MG SL tablet DISSOLVE 1 TABLET UNDER THE TONGUE EVERY 5 MINUTES AS NEEDED FOR CHEST PAIN. DO NOT EXCEED A TOTAL OF 3 DOSES IN 15 MINUTES. 25 tablet 3   pantoprazole (PROTONIX) 40 MG tablet Take 1 tablet (40 mg total) by mouth daily before breakfast. 30 tablet 11   valsartan (DIOVAN) 160 MG tablet TAKE 1 TABLET BY MOUTH EVERY DAY 90 tablet 3   No current facility-administered medications for this visit.    Allergies as of 06/05/2023 - Review Complete 04/24/2023  Allergen Reaction Noted   Latex Itching 04/24/2023   Other Itching 03/28/2022   Codeine Nausea Only 10/17/2014   Milk-related compounds Diarrhea 11/03/2015    Family History  Problem Relation Age of Onset   Stroke Mother    Cancer Father    Lung cancer Father    Cancer Sister    Diabetes Brother    Diabetes Brother    Diabetes Brother    Pancreatic cancer Neg Hx    Allergic rhinitis Neg Hx    Angioedema Neg Hx  Asthma Neg Hx    Eczema Neg Hx     Social History   Socioeconomic History   Marital status: Widowed    Spouse name: Not on file   Number of children: 2   Years of education: Not on file   Highest education level: Not on file  Occupational History   Not on file  Tobacco Use   Smoking status: Never   Smokeless tobacco: Never  Vaping Use   Vaping status: Never Used  Substance and Sexual Activity   Alcohol use: No    Alcohol/week: 0.0 standard drinks of alcohol   Drug use: No   Sexual activity: Not on file  Other Topics Concern   Not on file  Social History Narrative   Step son lives with her.  She is a widow.   Social Determinants of Health   Financial Resource Strain: Not on file  Food Insecurity: No Food Insecurity (12/05/2022)   Hunger Vital Sign    Worried About Running Out of Food in the Last Year: Never true     Ran Out of Food in the Last Year: Never true  Transportation Needs: No Transportation Needs (12/05/2022)   PRAPARE - Administrator, Civil Service (Medical): No    Lack of Transportation (Non-Medical): No  Physical Activity: Not on file  Stress: Not on file  Social Connections: Not on file    Review of Systems: Gen: Denies fever, chills, anorexia. Denies fatigue, weakness, weight loss.  CV: Denies chest pain, palpitations, syncope, peripheral edema, and claudication. Resp: Denies dyspnea at rest, cough, wheezing, coughing up blood, and pleurisy. GI: Denies vomiting blood, jaundice, and fecal incontinence.   Denies dysphagia or odynophagia. Derm: Denies rash, itching, dry skin Psych: Denies depression, anxiety, memory loss, confusion. No homicidal or suicidal ideation.  Heme: Denies bruising, bleeding, and enlarged lymph nodes.  Physical Exam: There were no vitals taken for this visit. General:   Alert and oriented. No distress noted. Pleasant and cooperative.  Head:  Normocephalic and atraumatic. Eyes:  Conjuctiva clear without scleral icterus. Heart:  S1, S2 present without murmurs appreciated. Lungs:  Clear to auscultation bilaterally. No wheezes, rales, or rhonchi. No distress.  Abdomen:  +BS, soft, non-tender and non-distended. No rebound or guarding. No HSM or masses noted. Msk:  Symmetrical without gross deformities. Normal posture. Extremities:  Without edema. Neurologic:  Alert and  oriented x4 Psych:  Normal mood and affect.    Assessment:     Plan:  ***   Ermalinda Memos, PA-C [facility]enter[facility] 06/05/2023

## 2023-06-05 ENCOUNTER — Encounter: Payer: Self-pay | Admitting: *Deleted

## 2023-06-05 ENCOUNTER — Encounter: Payer: Self-pay | Admitting: Gastroenterology

## 2023-06-05 ENCOUNTER — Telehealth: Payer: Self-pay | Admitting: *Deleted

## 2023-06-05 ENCOUNTER — Ambulatory Visit: Payer: Medicare HMO | Admitting: Gastroenterology

## 2023-06-05 ENCOUNTER — Other Ambulatory Visit: Payer: Self-pay | Admitting: *Deleted

## 2023-06-05 VITALS — BP 137/87 | HR 69 | Temp 97.9°F | Ht 67.0 in | Wt 171.8 lb

## 2023-06-05 DIAGNOSIS — K862 Cyst of pancreas: Secondary | ICD-10-CM | POA: Diagnosis not present

## 2023-06-05 DIAGNOSIS — K5909 Other constipation: Secondary | ICD-10-CM | POA: Diagnosis not present

## 2023-06-05 DIAGNOSIS — K219 Gastro-esophageal reflux disease without esophagitis: Secondary | ICD-10-CM | POA: Diagnosis not present

## 2023-06-05 DIAGNOSIS — E1122 Type 2 diabetes mellitus with diabetic chronic kidney disease: Secondary | ICD-10-CM | POA: Diagnosis not present

## 2023-06-05 NOTE — Patient Instructions (Signed)
We will get your MRI rescheduled.  Continue pantoprazole 40 mg daily.  Continue Gummies that you are taking for constipation.  We will plan to see back in 1 year or sooner if needed.  It was great to see you today!  I hope you have a fantastic Thanksgiving and Christmas!  Ermalinda Memos, PA-C [facility]

## 2023-06-05 NOTE — Telephone Encounter (Signed)
Attempted to call pt x 2, states call can not be completed as dialed.   Spoke to Norva Pavlov (pt's son on [place]) and he will have pt call office regarding MRI appointment.    MRI scheduled for Friday, 06/09/23, arrive at 3:45 pm to check in , NPO 4 hours prior.

## 2023-06-06 NOTE — Telephone Encounter (Signed)
Pt informed of MRI appt date, time, instructions and location. Verbalized understanding.

## 2023-06-06 NOTE — Telephone Encounter (Signed)
[employer] PA: Approved Tracking [phone]  Dates of service 06/09/2023 - 08/08/2023

## 2023-06-09 ENCOUNTER — Other Ambulatory Visit (HOSPITAL_COMMUNITY): Payer: Self-pay | Admitting: Gastroenterology

## 2023-06-09 ENCOUNTER — Ambulatory Visit (HOSPITAL_COMMUNITY)
Admission: RE | Admit: 2023-06-09 | Discharge: 2023-06-09 | Disposition: A | Discharge status: Home / Self Care | Payer: Medicare HMO | Source: Ambulatory Visit | Attending: Gastroenterology | Admitting: Gastroenterology

## 2023-06-09 DIAGNOSIS — K862 Cyst of pancreas: Secondary | ICD-10-CM | POA: Insufficient documentation

## 2023-06-09 DIAGNOSIS — R932 Abnormal findings on diagnostic imaging of liver and biliary tract: Secondary | ICD-10-CM | POA: Diagnosis not present

## 2023-06-09 DIAGNOSIS — K8020 Calculus of gallbladder without cholecystitis without obstruction: Secondary | ICD-10-CM | POA: Diagnosis not present

## 2023-06-09 MED ORDER — GADOBUTROL 1 MMOL/ML IV SOLN
7.5000 mL | Freq: Once | INTRAVENOUS | Status: AC | PRN
  Administered 2023-06-09: 7.5 mL via INTRAVENOUS

## 2023-06-22 ENCOUNTER — Other Ambulatory Visit: Payer: Self-pay | Admitting: Primary Care

## 2023-06-22 NOTE — Progress Notes (Signed)
Cardiology Office Note    Patient Name: [name] Date of Encounter: 06/22/2023  Primary Care Provider:  Ignatius Specking, MD Primary Cardiologist:  Christell Constant, MD Primary Electrophysiologist: None   Past Medical History    Past Medical History:  Diagnosis Date   CAD (coronary artery disease)    a. s/p NSTEMI in 07/2021 with cath showing minimal CAD and no culprit lesion identified   Constipation    Cramps, extremity    legs and hands   Diabetes (HCC)    type II   Enterococcus UTI 02/03/2016   H/O cardiovascular stress test 03/05/2009   normal, no ischemia   H/O Doppler ultrasound    lower ext arterial doppler 04/13/09-no evidence of arterial insufficiency, normal values; distal aorta 2.4x2.7   HTN (hypertension)    Murmur, cardiac    echo 02/25/13- EF 60-65%, impaired relaxation-grade 1 diastolic dysfunction, mild concentric lvh, mild to moderate aortic regurgitation   Myocardial infarction [facility])    january   Pancreatic cyst    Pneumonia    May 2017   Postoperative anemia due to acute blood loss 02/02/2016   Primary localized osteoarthritis of right knee    Recurrent upper respiratory infection (URI)    TIA (transient ischemic attack)    carotid doppler 05/08/11-normal patency    History of Present Illness  [name] is a 88 y.o. female with PMH of CAD s/p NSTEMI 07/2021 with minimal nonobstructive CAD, moderate aortic regurg, HTN, HLD type II, prior TIA, CKD stage III who presents today for   overdue 42-month follow-up.  [name] was last seen on 10/2022 and reported doing well with less shortness of breath after following up with her allergist and stable blood pressure. She reported occasional episodes of cramping in her legs and was advised to increase fluid intake to at least 64 ounces daily.  She was euvolemic on examination and advised to continue Lasix 40 mg daily.  She noted some memory deficits and was referred to neurology for  further evaluation.  She was admitted on 12/05/2022 with bilateral foot pain and found to have acute kidney injury with creatinine elevated to 2.46.  She was treated with IV fluids and creatinine trended downward.  She was found to have elevated uric acid levels and foot pain with associated with gout with advisement to continue allopurinol and to avoid NSAIDs due to stage IV renal failure.  Ms. [name] presents today with  multiple complaints. The chief complaint is foot pain due to gout. The patient was previously hospitalized for this condition and was discharged with allopurinol. The patient reports that the foot pain has not worsened since the last visit but is still present. She also reports experiencing sciatic pain that radiates down the leg to the ankle. The pain is described as tingling, similar to the sensation of a limb falling asleep. The pain is sometimes worse at night and can cause cramping. The patient is currently on Neurontin for this condition. The patient also mentions having memory problems and difficulties with handwriting, which have been ongoing for about two years. The patient was previously referred to a neurologist for these issues but does not recall if they followed up.  Patient denies chest pain, palpitations, dyspnea, PND, orthopnea, nausea, vomiting, dizziness, syncope, edema, weight gain, or early satiety.   Review of Systems  Please see the history of present illness.    All other systems reviewed and are otherwise negative except as noted above.  Physical Exam    Wt Readings from Last 3 Encounters:  06/05/23 171 lb 12.8 oz (77.9 kg)  04/24/23 167 lb (75.8 kg)  04/10/23 163 lb (73.9 kg)   YQ:MVHQI were no vitals filed for this visit.,There is no height or weight on file to calculate BMI. GEN: Well nourished, well developed in no acute distress Neck: No JVD; No carotid bruits Pulmonary: Clear to auscultation without rales, wheezing or rhonchi  Cardiovascular:  Normal rate. Regular rhythm. Normal S1. Normal S2.   Murmurs: There is no murmur.  ABDOMEN: Soft, non-tender, non-distended EXTREMITIES:  No edema; No deformity   EKG/LABS/ Recent Cardiac Studies   ECG personally reviewed by me today -sinus rhythm with rate of 63 bpm and no acute changes consistent with previous EKG.  Risk Assessment/Calculations:          Lab Results  Component Value Date   WBC 7.6 12/08/2022   HGB 9.7 (L) 12/08/2022   HCT 29.4 (L) 12/08/2022   MCV 94.2 12/08/2022   PLT 150 12/08/2022   Lab Results  Component Value Date   CREATININE 1.94 (H) 12/08/2022   BUN 44 (H) 12/08/2022   NA 135 12/08/2022   K 4.0 12/08/2022   CL 108 12/08/2022   CO2 21 (L) 12/08/2022   Lab Results  Component Value Date   CHOL 165 03/01/2013   HDL 43 03/01/2013   LDLCALC 79 03/01/2013   TRIG 214 (H) 03/01/2013   CHOLHDL 3.8 03/01/2013    Lab Results  Component Value Date   HGBA1C 7.5 (H) 12/05/2022   Assessment & Plan    1.CAD: -s/p NSTEMI in 07/2021 with minimal coronary artery disease per [facility]. -Today patient reports no chest pain or angina since previous follow-up.  2. Memory Deficit and Dysgraphia Reports of worsening memory and handwriting over the past two years. -Refer to neurologist for further evaluation and management.  3.Nonrheumatic aortic insufficiency: -Most recent 2D echo completed showing EF of 60 to 65% with mean gradient of 7.1 mmHg -Order echocardiogram to assess current status of aortic valve. -Continue Lasix as prescribed.  4.  Essential hypertension: -Patient's blood pressure today was initially elevated at 144/70 and was 132/64 on recheck. -She will continue Toprol XL 25 mg daily  5.  CKD stage IIIb: -Patient's most recent creatinine was 1.6  -Continue follow-up with PCP    Disposition: Follow-up with Christell Constant, MD or APP in 6 months    Signed, Napoleon Form, Leodis Rains, NP 06/22/2023, 9:38 AM [facility]

## 2023-06-23 ENCOUNTER — Encounter: Payer: Self-pay | Admitting: Nurse Practitioner

## 2023-06-23 ENCOUNTER — Ambulatory Visit: Payer: Medicare HMO | Attending: Nurse Practitioner | Admitting: Nurse Practitioner

## 2023-06-23 VITALS — BP 144/70 | HR 63 | Ht 68.0 in | Wt 174.4 lb

## 2023-06-23 DIAGNOSIS — I10 Essential (primary) hypertension: Secondary | ICD-10-CM | POA: Diagnosis not present

## 2023-06-23 DIAGNOSIS — I351 Nonrheumatic aortic (valve) insufficiency: Secondary | ICD-10-CM | POA: Diagnosis not present

## 2023-06-23 DIAGNOSIS — G459 Transient cerebral ischemic attack, unspecified: Secondary | ICD-10-CM | POA: Diagnosis not present

## 2023-06-23 DIAGNOSIS — N1832 Chronic kidney disease, stage 3b: Secondary | ICD-10-CM

## 2023-06-23 DIAGNOSIS — I2510 Atherosclerotic heart disease of native coronary artery without angina pectoris: Secondary | ICD-10-CM

## 2023-06-23 DIAGNOSIS — E1122 Type 2 diabetes mellitus with diabetic chronic kidney disease: Secondary | ICD-10-CM | POA: Diagnosis not present

## 2023-06-23 NOTE — Patient Instructions (Signed)
Medication Instructions:  No Changes *If you need a refill on your cardiac medications before your next appointment, please call your pharmacy*   Lab Work: No Labs If you have labs (blood work) drawn today and your tests are completely normal, you will receive your results only by: MyChart Message (if you have MyChart) OR A paper copy in the mail If you have any lab test that is abnormal or we need to change your treatment, we will call you to review the results.   Testing/Procedures: [address], [address]. Your physician has requested that you have an echocardiogram. Echocardiography is a painless test that uses sound waves to create images of your heart. It provides your doctor with information about the size and shape of your heart and how well your heart's chambers and valves are working. This procedure takes approximately one hour. There are no restrictions for this procedure. Please do NOT wear cologne, perfume, aftershave, or lotions (deodorant is allowed). Please arrive 15 minutes prior to your appointment time.  Please note: We ask at that you not bring children with you during ultrasound (echo/ vascular) testing. Due to room size and safety concerns, children are not allowed in the ultrasound rooms during exams. Our front office staff cannot provide observation of children in our lobby area while testing is being conducted. An adult accompanying a patient to their appointment will only be allowed in the ultrasound room at the discretion of the ultrasound technician under special circumstances. We apologize for any inconvenience.    Follow-Up: At [facility], you and your health needs are our priority.  As part of our continuing mission to provide you with exceptional heart care, we have created designated Provider Care Teams.  These Care Teams include your primary Cardiologist (physician) and Advanced Practice Providers (APPs -  Physician Assistants and Nurse  Practitioners) who all work together to provide you with the care you need, when you need it.  We recommend signing up for the patient portal called "MyChart".  Sign up information is provided on this After Visit Summary.  MyChart is used to connect with patients for Virtual Visits (Telemedicine).  Patients are able to view lab/test results, encounter notes, upcoming appointments, etc.  Non-urgent messages can be sent to your provider as well.   To learn more about what you can do with MyChart, go to [email or web address].    Your next appointment:   6 month(s)  Provider:   Christell Constant, MD

## 2023-06-28 ENCOUNTER — Encounter: Payer: Self-pay | Admitting: *Deleted

## 2023-07-07 DIAGNOSIS — W109XXA Fall (on) (from) unspecified stairs and steps, initial encounter: Secondary | ICD-10-CM | POA: Diagnosis not present

## 2023-07-07 DIAGNOSIS — M7731 Calcaneal spur, right foot: Secondary | ICD-10-CM | POA: Diagnosis not present

## 2023-07-07 DIAGNOSIS — M19041 Primary osteoarthritis, right hand: Secondary | ICD-10-CM | POA: Diagnosis not present

## 2023-07-07 DIAGNOSIS — Z96651 Presence of right artificial knee joint: Secondary | ICD-10-CM | POA: Diagnosis not present

## 2023-07-07 DIAGNOSIS — Z043 Encounter for examination and observation following other accident: Secondary | ICD-10-CM | POA: Diagnosis not present

## 2023-07-07 DIAGNOSIS — M1712 Unilateral primary osteoarthritis, left knee: Secondary | ICD-10-CM | POA: Diagnosis not present

## 2023-07-07 DIAGNOSIS — S60011A Contusion of right thumb without damage to nail, initial encounter: Secondary | ICD-10-CM | POA: Diagnosis not present

## 2023-07-07 DIAGNOSIS — M79644 Pain in right finger(s): Secondary | ICD-10-CM | POA: Diagnosis not present

## 2023-07-07 DIAGNOSIS — M160 Bilateral primary osteoarthritis of hip: Secondary | ICD-10-CM | POA: Diagnosis not present

## 2023-07-13 DIAGNOSIS — M109 Gout, unspecified: Secondary | ICD-10-CM | POA: Diagnosis not present

## 2023-07-13 DIAGNOSIS — Z8673 Personal history of transient ischemic attack (TIA), and cerebral infarction without residual deficits: Secondary | ICD-10-CM | POA: Diagnosis not present

## 2023-07-13 DIAGNOSIS — I252 Old myocardial infarction: Secondary | ICD-10-CM | POA: Diagnosis not present

## 2023-07-13 DIAGNOSIS — I2510 Atherosclerotic heart disease of native coronary artery without angina pectoris: Secondary | ICD-10-CM | POA: Diagnosis not present

## 2023-07-13 DIAGNOSIS — N184 Chronic kidney disease, stage 4 (severe): Secondary | ICD-10-CM | POA: Diagnosis not present

## 2023-07-13 DIAGNOSIS — I10 Essential (primary) hypertension: Secondary | ICD-10-CM | POA: Diagnosis not present

## 2023-07-13 DIAGNOSIS — R251 Tremor, unspecified: Secondary | ICD-10-CM | POA: Diagnosis not present

## 2023-07-13 DIAGNOSIS — K219 Gastro-esophageal reflux disease without esophagitis: Secondary | ICD-10-CM | POA: Diagnosis not present

## 2023-07-13 DIAGNOSIS — E1165 Type 2 diabetes mellitus with hyperglycemia: Secondary | ICD-10-CM | POA: Diagnosis not present

## 2023-07-13 DIAGNOSIS — R0989 Other specified symptoms and signs involving the circulatory and respiratory systems: Secondary | ICD-10-CM | POA: Diagnosis not present

## 2023-07-14 LAB — LAB REPORT - SCANNED
A1c: 7.2
EGFR: 27
TSH: 1.15 (ref 0.41–5.90)

## 2023-08-03 ENCOUNTER — Ambulatory Visit (HOSPITAL_COMMUNITY): Payer: Medicare HMO | Attending: Cardiology

## 2023-08-03 DIAGNOSIS — I351 Nonrheumatic aortic (valve) insufficiency: Secondary | ICD-10-CM | POA: Diagnosis not present

## 2023-08-03 DIAGNOSIS — I10 Essential (primary) hypertension: Secondary | ICD-10-CM | POA: Diagnosis not present

## 2023-08-03 DIAGNOSIS — G459 Transient cerebral ischemic attack, unspecified: Secondary | ICD-10-CM | POA: Diagnosis not present

## 2023-08-03 DIAGNOSIS — I2510 Atherosclerotic heart disease of native coronary artery without angina pectoris: Secondary | ICD-10-CM | POA: Insufficient documentation

## 2023-08-03 DIAGNOSIS — N1832 Chronic kidney disease, stage 3b: Secondary | ICD-10-CM | POA: Insufficient documentation

## 2023-08-03 LAB — ECHOCARDIOGRAM COMPLETE
Area-P 1/2: 2.82 cm2
P 1/2 time: 782 ms
S' Lateral: 4.3 cm

## 2023-08-03 MED ORDER — PERFLUTREN LIPID MICROSPHERE
1.0000 mL | INTRAVENOUS | Status: AC | PRN
  Administered 2023-08-03: 2 mL via INTRAVENOUS

## 2023-08-24 DIAGNOSIS — M25562 Pain in left knee: Secondary | ICD-10-CM | POA: Diagnosis not present

## 2023-09-04 DIAGNOSIS — M109 Gout, unspecified: Secondary | ICD-10-CM | POA: Diagnosis not present

## 2023-09-04 DIAGNOSIS — R7989 Other specified abnormal findings of blood chemistry: Secondary | ICD-10-CM | POA: Diagnosis not present

## 2023-09-04 DIAGNOSIS — Z1321 Encounter for screening for nutritional disorder: Secondary | ICD-10-CM | POA: Diagnosis not present

## 2023-09-04 DIAGNOSIS — E1165 Type 2 diabetes mellitus with hyperglycemia: Secondary | ICD-10-CM | POA: Diagnosis not present

## 2023-09-04 DIAGNOSIS — D519 Vitamin B12 deficiency anemia, unspecified: Secondary | ICD-10-CM | POA: Diagnosis not present

## 2023-09-04 DIAGNOSIS — Z13220 Encounter for screening for lipoid disorders: Secondary | ICD-10-CM | POA: Diagnosis not present

## 2023-09-04 DIAGNOSIS — K219 Gastro-esophageal reflux disease without esophagitis: Secondary | ICD-10-CM | POA: Diagnosis not present

## 2023-09-04 DIAGNOSIS — I10 Essential (primary) hypertension: Secondary | ICD-10-CM | POA: Diagnosis not present

## 2023-09-07 DIAGNOSIS — B351 Tinea unguium: Secondary | ICD-10-CM | POA: Diagnosis not present

## 2023-09-07 DIAGNOSIS — L84 Corns and callosities: Secondary | ICD-10-CM | POA: Diagnosis not present

## 2023-09-07 DIAGNOSIS — M79676 Pain in unspecified toe(s): Secondary | ICD-10-CM | POA: Diagnosis not present

## 2023-09-07 DIAGNOSIS — E1142 Type 2 diabetes mellitus with diabetic polyneuropathy: Secondary | ICD-10-CM | POA: Diagnosis not present

## 2023-09-11 ENCOUNTER — Ambulatory Visit: Payer: Medicare HMO | Admitting: Gastroenterology

## 2023-09-11 DIAGNOSIS — I10 Essential (primary) hypertension: Secondary | ICD-10-CM | POA: Diagnosis not present

## 2023-09-11 DIAGNOSIS — E1122 Type 2 diabetes mellitus with diabetic chronic kidney disease: Secondary | ICD-10-CM | POA: Diagnosis not present

## 2023-09-11 DIAGNOSIS — Z6826 Body mass index (BMI) 26.0-26.9, adult: Secondary | ICD-10-CM | POA: Diagnosis not present

## 2023-09-11 DIAGNOSIS — N184 Chronic kidney disease, stage 4 (severe): Secondary | ICD-10-CM | POA: Diagnosis not present

## 2023-09-11 DIAGNOSIS — I5189 Other ill-defined heart diseases: Secondary | ICD-10-CM | POA: Diagnosis not present

## 2023-09-19 ENCOUNTER — Other Ambulatory Visit: Payer: Self-pay | Admitting: Primary Care

## 2023-09-19 NOTE — Progress Notes (Unsigned)
 Referring Provider: Ignatius Specking, MD Primary Care Physician:  Ignatius Specking, MD Primary GI Physician: Dr. Marletta Lor  Chief Complaint  Patient presents with   Follow-up    Follow up. No problems     HPI:   [name] is a 89 y.o. female with history of CAD, NSTEMI January 2023, T2DM, HTN, TIA, interstitial pneumonitis GERD, dysphagia, constipation, hepatic cyst, pancreatic cyst, presenting today with report of cough and shortness of breath with walking.   Regarding her pancreatic cyst, she underwent EUS/FNA September 2023.  Cytology suggested mucinous cystic neoplasm or potentially IPMN. Final Interpace Pancreagen fluid analysis studies showing statistically higher risk cyst is noted based on this final testing.  CA 19-9 was normal October 2023.  Dr. Meridee Score discussed at [facility]. Ultimately recommended close surveillance with MRI/MRCP at 73-month interval from EUS (scheduled for tomorrow).  If no dramatic change, then 40-month follow-up, and if stable, then 1 year surveillance thereafter.    Last seen in the office 06/05/2023.  Clinically doing well.  GERD controlled on pantoprazole 40 mg daily.  Constipation doing well on an over-the-counter gummy though she was not sure what it was.  No other significant GI symptoms.  Recommended continuing current medications, follow-up with in 1 year or sooner if needed.   Today:  GERD:  No reflux symptoms. Taking pantoprazole 40 mg daily.  No nausea, vomiting, dysphagia.  Constipation:  No issue with this.  Occasionally, if she gets in a hurry, trying to make herself have a bowel movement quickly, she will have some abdominal cramping, but this is not routine.  Has been dealing with cough and sob with walking. Hasn't seen pulmonology.     Regarding her history of pancreatic cyst, she underwent EUS/FNA September 2023.  Cytology suggested mucinous cystic neoplasm or potentially IPMN. Final Interpace Pancreagen fluid analysis studies showing  statistically higher risk cyst is noted based on this final testing.  CA 19-9 was normal October 2023.  Dr. Meridee Score discussed at [facility]. Ultimately recommended close surveillance with MRI/MRCP at 16-month interval from EUS (scheduled for tomorrow).  If no dramatic change, then 73-month follow-up, and if stable, then 1 year surveillance thereafter.    MRI/MRCP 10/13/2022: IMPRESSION: 1. Unchanged lobulated, thinly septated fluid signal cystic lesion arising from the superior aspect of the pancreatic neck measuring 3.2 x 2.8 cm. No solid component or suspicious contrast enhancement. This is most consistent with a pseudocyst or side branch IPMN and is almost certainly benign. Given advanced patient age and established initial stability, one additional surveillance MR can be considered in 2 years if clinically appropriate. 2. Unchanged, definitively benign liver cysts, for which no further follow-up or characterization is required. 3. Cholelithiasis. No evidence of acute cholecystitis or biliary dilatation. 4. Cardiomegaly.   Dr. Meridee Score recommended MRI/MRCP in 6-12 months with primary GI.  MRI/MRCP with and without contrast 06/09/2023: Impression:  1. Unchanged, lobulated thinly septated fluid signal cystic lesion arising from the superior pancreatic neck measuring 3.3 x 2.4 cm. No solid component or suspicious contrast enhancement. No pancreatic ductal dilatation or surrounding inflammatory changes. This remains most consistent with an IPMN, and is almost certainly benign. Given advanced patient age and established imaging stability, further follow-up is of doubtful clinical value. 2.  Cholelithiasis. 3.  No acute findings in the abdomen. Recommended 1 year surveillance as previously recommended by Dr. Barbarann Ehlers due to high risk cyst per EUS.  Past Medical History:  Diagnosis Date   CAD (coronary artery disease)  a. s/p NSTEMI in 07/2021 with cath showing minimal CAD and no  culprit lesion identified   Constipation    Cramps, extremity    legs and hands   Diabetes (HCC)    type II   Enterococcus UTI 02/03/2016   H/O cardiovascular stress test 03/05/2009   normal, no ischemia   H/O Doppler ultrasound    lower ext arterial doppler 04/13/09-no evidence of arterial insufficiency, normal values; distal aorta 2.4x2.7   HTN (hypertension)    Murmur, cardiac    echo 02/25/13- EF 60-65%, impaired relaxation-grade 1 diastolic dysfunction, mild concentric lvh, mild to moderate aortic regurgitation   Myocardial infarction [facility])    january   Pancreatic cyst    Pneumonia    May 2017   Postoperative anemia due to acute blood loss 02/02/2016   Primary localized osteoarthritis of right knee    Recurrent upper respiratory infection (URI)    TIA (transient ischemic attack)    carotid doppler 05/08/11-normal patency    Past Surgical History:  Procedure Laterality Date   ABDOMINAL HYSTERECTOMY     BIOPSY  03/31/2022   Procedure: BIOPSY;  Surgeon: Lemar Lofty., MD;  Location: [facility];  Service: Gastroenterology;;   BUNIONECTOMY Left    COLONOSCOPY  04/29/2010   Dr. Matthias Hughs; diverticulosis in the entire colon, otherwise normal exam. Recommended repeat colonoscopy in 5 years for surveillance due to personal history of polyps.   ESOPHAGOGASTRODUODENOSCOPY (EGD) WITH PROPOFOL N/A 03/31/2022   Procedure: ESOPHAGOGASTRODUODENOSCOPY (EGD) WITH PROPOFOL;  Surgeon: Meridee Score Netty Starring., MD;  Location: [facility];  Service: Gastroenterology;  Laterality: N/A;   EUS N/A 03/31/2022   Procedure: UPPER ENDOSCOPIC ULTRASOUND (EUS) LINEAR;  Surgeon: Lemar Lofty., MD;  Location: [facility];  Service: Gastroenterology;  Laterality: N/A;   FINE NEEDLE ASPIRATION  03/31/2022   Procedure: FINE NEEDLE ASPIRATION (FNA) LINEAR;  Surgeon: Lemar Lofty., MD;  Location: [facility];  Service: Gastroenterology;;   Gaspar Bidding DILATION N/A 03/31/2022    Procedure: Gaspar Bidding DILATION;  Surgeon: Lemar Lofty., MD;  Location: [facility];  Service: Gastroenterology;  Laterality: N/A;   TOTAL KNEE ARTHROPLASTY Right 02/01/2016   Procedure: TOTAL KNEE ARTHROPLASTY;  Surgeon: Salvatore Marvel, MD;  Location: [facility] OR;  Service: Orthopedics;  Laterality: Right;    Current Outpatient Medications  Medication Sig Dispense Refill   allopurinol (ZYLOPRIM) 100 MG tablet Take 1 tablet (100 mg total) by mouth daily. 30 tablet 1   aspirin EC 81 MG tablet Take 81 mg by mouth daily. Swallow whole.     atorvastatin (LIPITOR) 80 MG tablet Take 80 mg by mouth daily.     furosemide (LASIX) 20 MG tablet Take 1 tablet (20 mg total) by mouth daily. 30 tablet 1   gabapentin (NEURONTIN) 100 MG capsule Take 100 mg by mouth 4 (four) times daily.     glipiZIDE (GLUCOTROL XL) 5 MG 24 hr tablet Take 5 mg by mouth 2 (two) times daily.     metoprolol succinate (TOPROL-XL) 25 MG 24 hr tablet Take 25 mg by mouth daily.     nitroGLYCERIN (NITROSTAT) 0.4 MG SL tablet DISSOLVE 1 TABLET UNDER THE TONGUE EVERY 5 MINUTES AS NEEDED FOR CHEST PAIN. DO NOT EXCEED A TOTAL OF 3 DOSES IN 15 MINUTES. 25 tablet 3   pantoprazole (PROTONIX) 40 MG tablet Take 1 tablet (40 mg total) by mouth daily before breakfast. 30 tablet 11   valsartan (DIOVAN) 160 MG tablet TAKE 1 TABLET BY MOUTH EVERY DAY 90 tablet 3  ipratropium (ATROVENT) 0.03 % nasal spray Place 2 sprays in each nostril up to three times daily as needed for runny nose (Patient not taking: Reported on 09/21/2023) 30 mL 5   latanoprost (XALATAN) 0.005 % ophthalmic solution Place 1 drop into both eyes at bedtime. (Patient not taking: Reported on 09/21/2023)     No current facility-administered medications for this visit.    Allergies as of 09/21/2023 - Review Complete 09/21/2023  Allergen Reaction Noted   Latex Itching 04/24/2023   Other Itching 03/28/2022   Codeine Nausea Only 10/17/2014   Milk-related compounds Diarrhea 11/03/2015     Family History  Problem Relation Age of Onset   Stroke Mother    Cancer Father    Lung cancer Father    Cancer Sister    Diabetes Brother    Diabetes Brother    Diabetes Brother    Pancreatic cancer Neg Hx    Allergic rhinitis Neg Hx    Angioedema Neg Hx    Asthma Neg Hx    Eczema Neg Hx     Social History   Socioeconomic History   Marital status: Widowed    Spouse name: Not on file   Number of children: 2   Years of education: Not on file   Highest education level: Not on file  Occupational History   Not on file  Tobacco Use   Smoking status: Never   Smokeless tobacco: Never  Vaping Use   Vaping status: Never Used  Substance and Sexual Activity   Alcohol use: No    Alcohol/week: 0.0 standard drinks of alcohol   Drug use: No   Sexual activity: Not on file  Other Topics Concern   Not on file  Social History Narrative   Step son lives with her.  She is a widow.   Social Drivers of Corporate investment banker Strain: Not on file  Food Insecurity: No Food Insecurity (12/05/2022)   Hunger Vital Sign    Worried About Running Out of Food in the Last Year: Never true    Ran Out of Food in the Last Year: Never true  Transportation Needs: No Transportation Needs (12/05/2022)   PRAPARE - Administrator, Civil Service (Medical): No    Lack of Transportation (Non-Medical): No  Physical Activity: Not on file  Stress: Not on file  Social Connections: Not on file    Review of Systems: Gen: Denies fever, chills, cold or flulike symptoms, presyncope, syncope. CV: Denies chest pain, palpitations. Resp: See HPI GI: See HPI  Heme: See HPI  Physical Exam: BP 130/80 (BP Location: Right Arm, Patient Position: Sitting, Cuff Size: Normal)   Pulse 91   Temp 97.9 F (36.6 C) (Temporal)   Ht 5\' 7"  (1.702 m)   Wt 173 lb (78.5 kg)   BMI 27.10 kg/m  General:   Alert and oriented. No distress noted. Pleasant and cooperative.  Head:  Normocephalic and  atraumatic. Eyes:  Conjuctiva clear without scleral icterus. Heart:  S1, S2 present without murmurs appreciated. Lungs:  Clear to auscultation bilaterally. No wheezes, rales, or rhonchi. No distress.  Abdomen:  +BS, soft, non-tender and non-distended. No rebound or guarding. No HSM or masses noted. Msk:  Symmetrical without gross deformities. Normal posture. Extremities:  Without edema. Neurologic:  Alert and  oriented x4 Psych:  Normal mood and affect.    Assessment:  89 y.o. female with history of CAD, NSTEMI January 2023, T2DM, HTN, TIA,  interstitial pneumonitis, GERD, dysphagia,  constipation, hepatic cyst, pancreatic cyst, presenting today with chief complaint of cough and shortness of breath with walking.  She has no significant GI symptoms.  Denies any typical GERD symptoms on pantoprazole 40 mg daily.  No postnasal drainage or other recent cold or flulike symptoms.  While cough could be related to atypical GERD, SOB on exertion should not be. I do not see any reason to increase pantoprazole to twice a day at this time. Recommend following up with her pulmonologist for further evaluation.   She has a history of pancreatic cyst as detailed in HPI.  She is due for surveillance in late November 2025.  She is on recall for this.  Plan:  Continue pantoprazole 40 mg daily. Follow-up with pulmonology on cough and shortness of breath with exertion Due for MRI/MRCP for pancreatic cyst surveillance in November 2025.  She is on recall. Follow-up in 1 year or sooner if needed.    Ermalinda Memos, PA-C [facility] 09/21/2023

## 2023-09-21 ENCOUNTER — Encounter: Payer: Self-pay | Admitting: Gastroenterology

## 2023-09-21 ENCOUNTER — Ambulatory Visit (INDEPENDENT_AMBULATORY_CARE_PROVIDER_SITE_OTHER): Payer: Self-pay | Admitting: Gastroenterology

## 2023-09-21 VITALS — BP 130/80 | HR 91 | Temp 97.9°F | Ht 67.0 in | Wt 173.0 lb

## 2023-09-21 DIAGNOSIS — K219 Gastro-esophageal reflux disease without esophagitis: Secondary | ICD-10-CM | POA: Diagnosis not present

## 2023-09-21 DIAGNOSIS — R0602 Shortness of breath: Secondary | ICD-10-CM

## 2023-09-21 DIAGNOSIS — K862 Cyst of pancreas: Secondary | ICD-10-CM

## 2023-09-21 DIAGNOSIS — R059 Cough, unspecified: Secondary | ICD-10-CM

## 2023-09-21 DIAGNOSIS — Z8719 Personal history of other diseases of the digestive system: Secondary | ICD-10-CM

## 2023-09-21 NOTE — Patient Instructions (Signed)
 Continue pantoprazole 40 mg daily.  Follow-up with pulmonology on your cough and shortness of breath.  I will plan to see back in 1 year or sooner if needed.  Ermalinda Memos, PA-C [facility]

## 2023-10-03 DIAGNOSIS — M1712 Unilateral primary osteoarthritis, left knee: Secondary | ICD-10-CM | POA: Diagnosis not present

## 2023-11-01 DIAGNOSIS — H401432 Capsular glaucoma with pseudoexfoliation of lens, bilateral, moderate stage: Secondary | ICD-10-CM | POA: Diagnosis not present

## 2023-11-01 DIAGNOSIS — H26493 Other secondary cataract, bilateral: Secondary | ICD-10-CM | POA: Diagnosis not present

## 2023-11-14 ENCOUNTER — Ambulatory Visit: Payer: Self-pay | Admitting: Pulmonary Disease

## 2023-11-14 ENCOUNTER — Encounter: Payer: Self-pay | Admitting: Pulmonary Disease

## 2023-11-14 VITALS — BP 131/77 | HR 77 | Ht 66.0 in | Wt 171.6 lb

## 2023-11-14 DIAGNOSIS — M069 Rheumatoid arthritis, unspecified: Secondary | ICD-10-CM | POA: Diagnosis not present

## 2023-11-14 DIAGNOSIS — I10 Essential (primary) hypertension: Secondary | ICD-10-CM | POA: Diagnosis not present

## 2023-11-14 DIAGNOSIS — Z23 Encounter for immunization: Secondary | ICD-10-CM | POA: Diagnosis not present

## 2023-11-14 DIAGNOSIS — J849 Interstitial pulmonary disease, unspecified: Secondary | ICD-10-CM

## 2023-11-14 DIAGNOSIS — N184 Chronic kidney disease, stage 4 (severe): Secondary | ICD-10-CM | POA: Diagnosis not present

## 2023-11-14 DIAGNOSIS — I5189 Other ill-defined heart diseases: Secondary | ICD-10-CM | POA: Diagnosis not present

## 2023-11-14 DIAGNOSIS — E1122 Type 2 diabetes mellitus with diabetic chronic kidney disease: Secondary | ICD-10-CM | POA: Diagnosis not present

## 2023-11-14 DIAGNOSIS — Z6826 Body mass index (BMI) 26.0-26.9, adult: Secondary | ICD-10-CM | POA: Diagnosis not present

## 2023-11-14 LAB — CK: Total CK: 88 U/L (ref 7–177)

## 2023-11-14 NOTE — Progress Notes (Signed)
 [name]    [id number]    1934-12-18  Primary Care Physician:Vyas, Veneda Gift, MD  Referring Physician: Orlena Bitters, MD [address] [place],  [place] [address]  Chief complaint: Consult for interstitial lung disease  HPI: 89 y.o. who  has a past medical history of CAD (coronary artery disease), Constipation, Cramps, extremity, Diabetes (HCC), Enterococcus UTI (02/03/2016), H/O cardiovascular stress test (03/05/2009), H/O Doppler ultrasound, HTN (hypertension), Murmur, cardiac, Myocardial infarction [facility]), Pancreatic cyst, Pneumonia, Postoperative anemia due to acute blood loss (02/02/2016), Primary localized osteoarthritis of right knee, Recurrent upper respiratory infection (URI), and TIA (transient ischemic attack).  Discussed the use of AI scribe software for clinical note transcription with the patient, who gave verbal consent to proceed.  History of Present Illness [name] is an 89 year old female with interstitial lung disease who presents with shortness of breath and balance issues. She was referred by Dr. Waymond Hailey for evaluation of interstitial lung disease noted on a CT scan.  She experiences shortness of breath during ambulation, which has been persistent for an unspecified duration. The shortness of breath does not worsen over time, except when eating, which she associates with a constant cough. She also reports balance issues, describing herself as 'not very steady,' and notes that walking is fatiguing.  She has a dry cough and nasal drip, particularly in the morning, described as 'cold and clear' without mucus. The cough becomes more persistent when she eats.  Her past medical history includes heart disease, a heart attack in 2023, diabetes, hypertension, a pancreatic cyst with EUS and biopsy, and a TIA. She also had COVID-19 approximately two to three years ago, which she recovered from without hospitalization.  She denies any history of smoking and  reports no current exposure to pets, mold, or hot tubs. She worked for [employer], where she was exposed to nylon fibers in the air, but not directly to asbestos.  She has a history of rheumatoid arthritis diagnosed three years ago, although she has not had blood tests to confirm this diagnosis and is not on any medication for it. She experiences joint pain, particularly in her shoulders and knees, with her fingers being swollen and deviated, and she gets cramps in her fingers.  She reports sciatica, feeling that her left hip is higher than her right, which affects her gait and necessitates the use of a cane. She also experiences bruising on her legs and back, particularly when getting in and out of the car and when sleeping.     Pets: Used to have a dog Occupation: Worked in [employer] Exposures: Exposure to nylon dust and fiber while working.  No ongoing exposures to mold, hot tub, Jacuzzi.  No feather pillows or comforters Smoking history: Never smoker Travel history: Originally from [place] .  No significant recent travel Relevant family history: No family history of lung disease  Outpatient Encounter Medications as of 11/14/2023  Medication Sig   allopurinol  (ZYLOPRIM ) 100 MG tablet Take 1 tablet (100 mg total) by mouth daily.   aspirin  EC 81 MG tablet Take 81 mg by mouth daily. Swallow whole.   atorvastatin (LIPITOR) 80 MG tablet Take 80 mg by mouth daily.   furosemide  (LASIX ) 20 MG tablet Take 1 tablet (20 mg total) by mouth daily.   gabapentin  (NEURONTIN ) 100 MG capsule Take 100 mg by mouth 4 (four) times daily.   glipiZIDE (GLUCOTROL XL) 5 MG 24 hr tablet Take 5 mg by  mouth 2 (two) times daily.   ipratropium (ATROVENT ) 0.03 % nasal spray Place 2 sprays in each nostril up to three times daily as needed for runny nose   latanoprost (XALATAN) 0.005 % ophthalmic solution Place 1 drop into both eyes at bedtime.   metoprolol  succinate (TOPROL -XL) 25 MG 24 hr tablet Take 25 mg by mouth daily.    nitroGLYCERIN  (NITROSTAT ) 0.4 MG SL tablet DISSOLVE 1 TABLET UNDER THE TONGUE EVERY 5 MINUTES AS NEEDED FOR CHEST PAIN. DO NOT EXCEED A TOTAL OF 3 DOSES IN 15 MINUTES.   pantoprazole  (PROTONIX ) 40 MG tablet Take 1 tablet (40 mg total) by mouth daily before breakfast.   valsartan  (DIOVAN ) 160 MG tablet TAKE 1 TABLET BY MOUTH EVERY DAY   No facility-administered encounter medications on file as of 11/14/2023.    Allergies as of 11/14/2023 - Review Complete 11/14/2023  Allergen Reaction Noted   Latex Itching 04/24/2023   Other Itching 03/28/2022   Codeine Nausea Only 10/17/2014   Milk-related compounds Diarrhea 11/03/2015    Past Medical History:  Diagnosis Date   CAD (coronary artery disease)    a. s/p NSTEMI in 07/2021 with cath showing minimal CAD and no culprit lesion identified   Constipation    Cramps, extremity    legs and hands   Diabetes (HCC)    type II   Enterococcus UTI 02/03/2016   H/O cardiovascular stress test 03/05/2009   normal, no ischemia   H/O Doppler ultrasound    lower ext arterial doppler 04/13/09-no evidence of arterial insufficiency, normal values; distal aorta 2.4x2.7   HTN (hypertension)    Murmur, cardiac    echo 02/25/13- EF 60-65%, impaired relaxation-grade 1 diastolic dysfunction, mild concentric lvh, mild to moderate aortic regurgitation   Myocardial infarction [facility])    january   Pancreatic cyst    Pneumonia    May 2017   Postoperative anemia due to acute blood loss 02/02/2016   Primary localized osteoarthritis of right knee    Recurrent upper respiratory infection (URI)    TIA (transient ischemic attack)    carotid doppler 05/08/11-normal patency    Past Surgical History:  Procedure Laterality Date   ABDOMINAL HYSTERECTOMY     BIOPSY  03/31/2022   Procedure: BIOPSY;  Surgeon: Normie Becton., MD;  Location: [facility];  Service: Gastroenterology;;   BUNIONECTOMY Left    COLONOSCOPY  04/29/2010   Dr. Dellis Fermo; diverticulosis in  the entire colon, otherwise normal exam. Recommended repeat colonoscopy in 5 years for surveillance due to personal history of polyps.   ESOPHAGOGASTRODUODENOSCOPY (EGD) WITH PROPOFOL  N/A 03/31/2022   Procedure: ESOPHAGOGASTRODUODENOSCOPY (EGD) WITH PROPOFOL ;  Surgeon: Brice Campi Albino Alu., MD;  Location: [facility];  Service: Gastroenterology;  Laterality: N/A;   EUS N/A 03/31/2022   Procedure: UPPER ENDOSCOPIC ULTRASOUND (EUS) LINEAR;  Surgeon: Normie Becton., MD;  Location: [facility];  Service: Gastroenterology;  Laterality: N/A;   FINE NEEDLE ASPIRATION  03/31/2022   Procedure: FINE NEEDLE ASPIRATION (FNA) LINEAR;  Surgeon: Normie Becton., MD;  Location: [facility];  Service: Gastroenterology;;   Dixie Frederickson DILATION N/A 03/31/2022   Procedure: Dixie Frederickson DILATION;  Surgeon: Normie Becton., MD;  Location: [facility];  Service: Gastroenterology;  Laterality: N/A;   TOTAL KNEE ARTHROPLASTY Right 02/01/2016   Procedure: TOTAL KNEE ARTHROPLASTY;  Surgeon: Elly Habermann, MD;  Location: [facility] OR;  Service: Orthopedics;  Laterality: Right;    Family History  Problem Relation Age of Onset   Stroke Mother    Cancer Father  Lung cancer Father    Cancer Sister    Diabetes Brother    Diabetes Brother    Diabetes Brother    Pancreatic cancer Neg Hx    Allergic rhinitis Neg Hx    Angioedema Neg Hx    Asthma Neg Hx    Eczema Neg Hx     Social History   Socioeconomic History   Marital status: Widowed    Spouse name: Not on file   Number of children: 2   Years of education: Not on file   Highest education level: Not on file  Occupational History   Not on file  Tobacco Use   Smoking status: Never   Smokeless tobacco: Never  Vaping Use   Vaping status: Never Used  Substance and Sexual Activity   Alcohol  use: No    Alcohol /week: 0.0 standard drinks of alcohol    Drug use: No   Sexual activity: Not on file  Other Topics Concern   Not on file  Social  History Narrative   Step son lives with her.  She is a widow.   Social Drivers of Corporate investment banker Strain: Not on file  Food Insecurity: No Food Insecurity (12/05/2022)   Hunger Vital Sign    Worried About Running Out of Food in the Last Year: Never true    Ran Out of Food in the Last Year: Never true  Transportation Needs: No Transportation Needs (12/05/2022)   PRAPARE - Administrator, Civil Service (Medical): No    Lack of Transportation (Non-Medical): No  Physical Activity: Not on file  Stress: Not on file  Social Connections: Not on file  Intimate Partner Violence: Not At Risk (12/05/2022)   Humiliation, Afraid, Rape, and Kick questionnaire    Fear of Current or Ex-Partner: No    Emotionally Abused: No    Physically Abused: No    Sexually Abused: No    Review of systems: Review of Systems  Constitutional: Negative for fever and chills.  HENT: Negative.   Eyes: Negative for blurred vision.  Respiratory: as per HPI  Cardiovascular: Negative for chest pain and palpitations.  Gastrointestinal: Negative for vomiting, diarrhea, blood per rectum. Genitourinary: Negative for dysuria, urgency, frequency and hematuria.  Musculoskeletal: Negative for myalgias, back pain and joint pain.  Skin: Negative for itching and rash.  Neurological: Negative for dizziness, tremors, focal weakness, seizures and loss of consciousness.  Endo/Heme/Allergies: Negative for environmental allergies.  Psychiatric/Behavioral: Negative for depression, suicidal ideas and hallucinations.  All other systems reviewed and are negative.  Physical Exam: Blood pressure 131/77, pulse 77, height 5\' 6"  (1.676 m), weight 171 lb 9.6 oz (77.8 kg), SpO2 95%. Gen:      No acute distress HEENT:  EOMI, sclera anicteric Neck:     No masses; no thyromegaly Lungs:    Mild bibasilar crackles CV:         Regular rate and rhythm; no murmurs Abd:      + bowel sounds; soft, non-tender; no palpable  masses, no distension Ext:    No edema; adequate peripheral perfusion Skin:      Warm and dry; no rash Neuro: alert and oriented x 3 Psych: normal mood and affect  Data Reviewed: Imaging:  CT chest 12/10/2021-chronic interstitial lung disease worse compared to 2017 CT high-resolution 08/31/2022-interstitial lung disease and probable UIP pattern.  Progressive changes noted I reviewed the images personally.  PFTs:  Labs:  Assessment and Plan Assessment & Plan Interstitial lung disease  Interstitial lung disease with scarring noted on CT scan in probable UIP pattern from February 2024, showing worsening compared to 2023. Possible causes include exposure, autoimmune conditions like rheumatoid arthritis, or idiopathic pulmonary fibrosis. Symptoms include exertional dyspnea and dry cough. Treatment options include antifibrotics to reduce scarring rate, though they are not curative and may be expensive. Further testing is needed to determine the cause and appropriate treatment. Discussed the need for CT scan, lung function test, and blood tests to evaluate potential causes. Patient assistance programs may help with medication costs.  - Order CT scan to reassess lung condition - Order lung function test - Perform blood tests to evaluate potential causes - Discuss insurance coverage with her before scheduling tests  Rheumatoid arthritis Rheumatoid arthritis diagnosed three years ago, with symptoms of joint pain, swelling, and deviation, particularly in fingers. No blood tests or treatment initiated previously. Potential contributor to interstitial lung disease.  History of heart attack Myocardial infarction in 2023. Currently on low-dose aspirin  for secondary prevention.  She is concerned about skin bruising and discussion needed with primary care provider regarding continuation of aspirin  due to risk of bruising and potential bleeding.  Recommendations: High-res CT, PFTs Lab test for further  evaluation of pulmonary fibrosis  Phyllis Breeze MD [facility] 11/14/2023, 2:30 PM  CC: Orlena Bitters, MD

## 2023-11-14 NOTE — Patient Instructions (Signed)
 VISIT SUMMARY:  Today, you were seen for shortness of breath and balance issues. We discussed your interstitial lung disease, which was noted on a recent CT scan, and your history of rheumatoid arthritis, heart disease, diabetes, hypertension, and sciatica. We reviewed your symptoms and medical history in detail to determine the best course of action.  YOUR PLAN:  -INTERSTITIAL LUNG DISEASE: Interstitial lung disease involves scarring of the lung tissue, which can make it difficult to breathe. We will order a CT scan, lung function test, and blood tests to better understand the cause and determine the appropriate treatment. We also discussed the possibility of using antifibrotic medications to slow the progression of the disease, though they can be expensive. We will look into patient assistance programs to help with medication costs.  -RHEUMATOID ARTHRITIS: Rheumatoid arthritis is an autoimmune condition that causes joint pain, swelling, and deformity. It may also contribute to your lung issues. We will perform blood tests to confirm the diagnosis and discuss potential treatment options.  -HISTORY OF HEART ATTACK: You had a heart attack in 2023 and are currently taking low-dose aspirin  to prevent another one. We need to discuss with your primary care provider whether to continue the aspirin , given your risk of bruising and potential bleeding.  -SCIATICA: Sciatica is pain that radiates along the path of the sciatic nerve, which can cause hip misalignment and affect your gait. You are currently using a cane to help with walking.  INSTRUCTIONS:  Please schedule the CT scan, lung function test, and blood tests as soon as possible. We will also discuss your insurance coverage before scheduling these tests. Follow up with your primary care provider to discuss the continuation of low-dose aspirin  for your heart condition.

## 2023-11-16 DIAGNOSIS — E1142 Type 2 diabetes mellitus with diabetic polyneuropathy: Secondary | ICD-10-CM | POA: Diagnosis not present

## 2023-11-16 DIAGNOSIS — L84 Corns and callosities: Secondary | ICD-10-CM | POA: Diagnosis not present

## 2023-11-16 DIAGNOSIS — B351 Tinea unguium: Secondary | ICD-10-CM | POA: Diagnosis not present

## 2023-11-16 DIAGNOSIS — M79674 Pain in right toe(s): Secondary | ICD-10-CM | POA: Diagnosis not present

## 2023-11-16 DIAGNOSIS — M79675 Pain in left toe(s): Secondary | ICD-10-CM | POA: Diagnosis not present

## 2023-11-17 LAB — ANTI-NUCLEAR AB-TITER (ANA TITER)
ANA TITER: 1:80 {titer} — ABNORMAL HIGH
ANA Titer 1: 1:80 {titer} — ABNORMAL HIGH

## 2023-11-17 LAB — ALDOLASE: Aldolase: 3.8 U/L (ref <=8.1)

## 2023-11-17 LAB — ANA,IFA RA DIAG PNL W/RFLX TIT/PATN
Anti Nuclear Antibody (ANA): POSITIVE — AB
Cyclic Citrullin Peptide Ab: <16 U
Rheumatoid fact SerPl-aCnc: <10 [IU]/mL (ref <14)

## 2023-11-17 LAB — ANTI-SCLERODERMA ANTIBODY: Scleroderma (Scl-70) (ENA) Antibody, IgG: <1 AI

## 2023-11-17 LAB — SJOGRENS SYNDROME-B EXTRACTABLE NUCLEAR ANTIBODY: SSB (La) (ENA) Antibody, IgG: <1 AI

## 2023-11-17 LAB — SJOGRENS SYNDROME-A EXTRACTABLE NUCLEAR ANTIBODY: SSA (Ro) (ENA) Antibody, IgG: <1 AI

## 2023-12-08 ENCOUNTER — Ambulatory Visit
Admission: RE | Admit: 2023-12-08 | Discharge: 2023-12-08 | Disposition: A | Discharge status: Home / Self Care | Source: Ambulatory Visit | Attending: Pulmonary Disease | Admitting: Pulmonary Disease

## 2023-12-08 DIAGNOSIS — E041 Nontoxic single thyroid nodule: Secondary | ICD-10-CM | POA: Diagnosis not present

## 2023-12-08 DIAGNOSIS — J849 Interstitial pulmonary disease, unspecified: Secondary | ICD-10-CM

## 2023-12-18 ENCOUNTER — Ambulatory Visit: Payer: HMO | Attending: Internal Medicine | Admitting: Internal Medicine

## 2023-12-18 VITALS — BP 112/60 | HR 88 | Ht 65.0 in | Wt 172.0 lb

## 2023-12-18 DIAGNOSIS — R0602 Shortness of breath: Secondary | ICD-10-CM

## 2023-12-18 DIAGNOSIS — I5022 Chronic systolic (congestive) heart failure: Secondary | ICD-10-CM | POA: Diagnosis not present

## 2023-12-18 DIAGNOSIS — I2510 Atherosclerotic heart disease of native coronary artery without angina pectoris: Secondary | ICD-10-CM | POA: Diagnosis not present

## 2023-12-18 DIAGNOSIS — R5383 Other fatigue: Secondary | ICD-10-CM | POA: Diagnosis not present

## 2023-12-18 MED ORDER — EMPAGLIFLOZIN 10 MG PO TABS: 10.0000 mg | ORAL_TABLET | Freq: Every day | ORAL | 3 refills | Status: DC

## 2023-12-18 MED ORDER — EMPAGLIFLOZIN 10 MG PO TABS: 10.0000 mg | ORAL_TABLET | Freq: Every day | ORAL | 0 refills | Status: AC

## 2023-12-18 MED ORDER — METOPROLOL SUCCINATE ER 25 MG PO TB24: 12.5000 mg | ORAL_TABLET | Freq: Every day | ORAL | 3 refills | Status: AC

## 2023-12-18 NOTE — Patient Instructions (Signed)
 Medication Instructions:  Your physician has recommended you make the following change in your medication:  DECREASE: metoprolol  succinate (Toprol -XL) to 12.5 mg by mouth once daily  START July 9: Empagliflozin (Jardiance) 10 mg by mouth once daily before breakfast  *If you need a refill on your cardiac medications before your next appointment, please call your pharmacy*  Lab Work: 2 WEEKS after starting Jardiance at any [employer] [employer]: BMP  If you have labs (blood work) drawn today and your tests are completely normal, you will receive your results only by: [employer] (if you have MyChart) OR A paper copy in the mail If you have any lab test that is abnormal or we need to change your treatment, we will call you to review the results.  Testing/Procedures: NONE  Follow-Up: At [facility], you and your health needs are our priority.  As part of our continuing mission to provide you with exceptional heart care, our providers are all part of one team.  This team includes your primary Cardiologist (physician) and Advanced Practice Providers or APPs (Physician Assistants and Nurse Practitioners) who all work together to provide you with the care you need, when you need it.  Your next appointment:   5 month(s)  Provider:   Lasalle Pointer, NP or MD in [facility]

## 2023-12-18 NOTE — Progress Notes (Signed)
 Cardiology Office Note:  .    Date:  12/18/2023  ID:  [name], DOB 05/25/1935, MRN [id number] PCP: Orlena Bitters, MD  [facility] Providers Cardiologist:  Jann Melody, MD     CC: Follow up CAD care  History of Present Illness: .    [name] is a 89 y.o. female with coronary artery disease who presents with fatigue and shortness of breath. She is accompanied by a friend for company.  She experiences persistent fatigue and a lack of energy, describing an inability to move from one room to another without feeling exhausted. She can perform light activities like making her bed and vacuuming one room at a time but struggles with more extensive tasks. She also experiences breathlessness that occurs unexpectedly and without a clear trigger.  Her history of coronary artery disease includes moderate nonobstructive blockages identified in early 2023. Initially seen for chest discomfort, she has not experienced further chest pressure since then. Her cholesterol and blood pressure are well-controlled with medication, including metoprolol  25 mg, which she takes to manage her heart rate and improve blood flow. Fatigue may be related to this medication.  She experiences shortness of breath when walking short distances, such as from the parking lot to the clinic and from the elevator to the office. She lives alone in [place] and drives herself when necessary, although she has no regular assistance.  She has a history of knee replacement on one side and requires replacement on the other knee, contributing to her physical limitations. However, no surgery is currently planned. Additionally, she has memory issues, as noted in a previous visit with a nurse practitioner, where she struggled to recall familiar street names.  She experiences chronic swelling in her ankle, sometimes causing pain at night and waking her up. She associates this with her history of gout, which  affects one foot.  She is planning a trip to [place] for two weeks and is concerned about her ability to manage the five-hour flight due to her symptoms.  Discussed the use of AI scribe software for clinical note transcription with the patient, who gave verbal consent to proceed.   Relevant histories: .  Social  2023: Established with me- mild valve disease non obstructive CAD 2024: Saw Renelda Carry; memory issues - Lives alone - Limited support system; would prefer to follow near her home in [place]  ROS: As per HPI.   Studies Reviewed: .     Cardiac Studies & Procedures   ______________________________________________________________________________________________   STRESS TESTS  NM MYOCAR MULTI W/SPECT W 11/03/2017  Narrative  There was no ST segment deviation noted during stress.  This is a low risk study.  The left ventricular ejection fraction is normal (55-65%).  Small mild intensity apical defect with mild reversibility and normal wall motion. Suggests differences in apical thinning vs mild apical infarct with mild peri-infarct ischemia. Either finding would support low risk   ECHOCARDIOGRAM  ECHOCARDIOGRAM COMPLETE 08/03/2023  Narrative ECHOCARDIOGRAM REPORT    Patient Name:   [name] Date of Exam: 08/03/2023 Medical Rec #:  [id number]           Height:       68.0 in Accession #:    [id number]          Weight:       174.4 lb Date of Birth:  1934/09/20           BSA:  1.928 m Patient Age:    67 years            BP:           144/70 mmHg Patient Gender: F                   HR:           67 bpm. Exam Location:  [address]  Procedure: 2D Echo, 3D Echo, Cardiac Doppler, Color Doppler and Intracardiac Opacification Agent  Indications:    I35.1 Aortic regurgitation  History:        Patient has prior history of Echocardiogram examinations, most recent 05/25/2022. CAD and Previous Myocardial Infarction, TIA, Signs/Symptoms:Murmur; Risk  Factors:Diabetes and Hypertension.  Sonographer:    Donnita Gales BA, RDCS Referring Phys: [phone] Karon Packer DICK  IMPRESSIONS   1. Left ventricular ejection fraction, by estimation, is 50 to 55%. Left ventricular ejection fraction by 3D volume is 48 %. The left ventricle has low normal function. The left ventricle has no regional wall motion abnormalities. The left ventricular internal cavity size was mildly dilated. Left ventricular diastolic parameters are consistent with Grade I diastolic dysfunction (impaired relaxation). 2. Right ventricular systolic function is normal. The right ventricular size is normal. 3. The mitral valve is grossly normal. Trivial mitral valve regurgitation. No evidence of mitral stenosis. 4. The aortic valve is tricuspid. Aortic valve regurgitation is mild. Aortic valve sclerosis is present, with no evidence of aortic valve stenosis.  Comparison(s): A prior study was performed on 05/25/2022. LVEF was reported to be 60-65% and now 50-55% with RWMA, LV cavity size a slightly dilated.  FINDINGS Left Ventricle: The inferior wall and posterior wall are hypokinetic. Left ventricular ejection fraction, by estimation, is 50 to 55%. Left ventricular ejection fraction by 3D volume is 48 %. The left ventricle has low normal function. The left ventricle has no regional wall motion abnormalities. Definity  contrast agent was given IV to delineate the left ventricular endocardial borders. The left ventricular internal cavity size was mildly dilated. There is borderline left ventricular hypertrophy. Left ventricular diastolic parameters are consistent with Grade I diastolic dysfunction (impaired relaxation).   LV Wall Scoring: The inferior wall and posterior wall are hypokinetic.  Right Ventricle: The right ventricular size is normal. No increase in right ventricular wall thickness. Right ventricular systolic function is normal.  Left Atrium: Left atrial size was normal in  size.  Right Atrium: Right atrial size was normal in size.  Pericardium: There is no evidence of pericardial effusion.  Mitral Valve: The mitral valve is grossly normal. Trivial mitral valve regurgitation. No evidence of mitral valve stenosis.  Tricuspid Valve: The tricuspid valve is normal in structure. Tricuspid valve regurgitation is mild . No evidence of tricuspid stenosis.  Aortic Valve: The aortic valve is tricuspid. Aortic valve regurgitation is mild. Aortic regurgitation PHT measures 782 msec. Aortic valve sclerosis is present, with no evidence of aortic valve stenosis.  Pulmonic Valve: The pulmonic valve was grossly normal. Pulmonic valve regurgitation is trivial. No evidence of pulmonic stenosis.  Aorta: The aortic root and ascending aorta are structurally normal, with no evidence of dilitation.  Venous: The inferior vena cava was not well visualized.  IAS/Shunts: The interatrial septum was not well visualized.   LEFT VENTRICLE PLAX 2D LVIDd:         5.70 cm         Diastology LVIDs:         4.30 cm  LV e' medial:    2.61 cm/s LV PW:         1.00 cm         LV E/e' medial:  16.1 LV IVS:        1.00 cm         LV e' lateral:   4.79 cm/s LVOT diam:     2.30 cm         LV E/e' lateral: 8.7 LV SV:         70 LV SV Index:   36 LVOT Area:     4.15 cm        3D Volume EF LV 3D EF:    Left ventricul ar ejection fraction by 3D volume is 48 %.  3D Volume EF: 3D EF:        48 % LV EDV:       170 ml LV ESV:       88 ml LV SV:        82 ml  RIGHT VENTRICLE RV Basal diam:  3.70 cm RV Mid diam:    2.30 cm RV S prime:     12.60 cm/s TAPSE (M-mode): 1.8 cm RVSP:           24.3 mmHg  LEFT ATRIUM             Index        RIGHT ATRIUM           Index LA diam:        4.40 cm 2.28 cm/m   RA Pressure: 3.00 mmHg LA Vol (A2C):   30.8 ml 15.97 ml/m  RA Area:     9.97 cm LA Vol (A4C):   32.6 ml 16.91 ml/m  RA Volume:   17.50 ml  9.08 ml/m LA Biplane Vol: 33.8 ml  17.53 ml/m AORTIC VALVE             PULMONIC VALVE LVOT Vmax:   82.80 cm/s  PR End Diast Vel: 4.58 msec LVOT Vmean:  56.100 cm/s LVOT VTI:    0.168 m AI PHT:      782 msec  AORTA Ao Root diam: 3.40 cm Ao Asc diam:  3.20 cm  MITRAL VALVE                TRICUSPID VALVE MV Area (PHT): 2.82 cm     TR Peak grad:   21.3 mmHg MV Decel Time: 269 msec     TR Vmax:        231.00 cm/s MV E velocity: 41.90 cm/s   Estimated RAP:  3.00 mmHg MV A velocity: 101.00 cm/s  RVSP:           24.3 mmHg MV E/A ratio:  0.41 SHUNTS Systemic VTI:  0.17 m Systemic Diam: 2.30 cm  Sunit Tolia Electronically signed by Olinda Bertrand Signature Date/Time: 08/03/2023/5:36:06 PM    Final          ______________________________________________________________________________________________      Physical Exam:    VS:  BP 112/60 (BP Location: Left Arm)   Pulse 88   Ht 5\' 5"  (1.651 m)   Wt 172 lb (78 kg)   SpO2 93%   BMI 28.62 kg/m    Wt Readings from Last 3 Encounters:  12/18/23 172 lb (78 kg)  11/14/23 171 lb 9.6 oz (77.8 kg)  09/21/23 173 lb (78.5 kg)    Gen: no distress  Neck: No JVD, prominent bounding carotid pulse with  no bruit and with no  Cardiac: No Rubs or Gallops, no murmur, RRR +2 radial pulses Respiratory: Clear to auscultation bilaterally, normal effort, normal  respiratory rate GI: Soft, nontender, non-distended  MS: No  edema;  moves all extremities Integument: Skin feels warm Neuro:  At time of evaluation, alert and oriented to person/place/time/situation  Psych: Normal affect, patient feels ok   ASSESSMENT AND PLAN: .    Heart failure with Preserved ejection fraction Heart failure with mildly reduced ejection fraction (EF 50-55%) with complaints of shortness of breath and lower extremity swelling. Symptoms may not be directly related to heart failure, but potential fluid overload is considered. No significant swelling on exam, but she reports persistent ankle swelling. -  Reduce metoprolol  dose from 25 mg to 12.5 mg. Monitor for increased fatigue or heart palpitations. Revert to original dose if symptoms worsen. - Initiate Jardiance 10 mg on January 15, 2024, post-[place] trip. Monitor for urinary tract infections due to increased sugar excretion. Advised on hydration and hygiene. - Order BMP in mid to late July to assess kidney function.  Coronary artery disease Moderate nonobstructive coronary artery disease diagnosed in early 2023. No recent chest discomfort. Cholesterol and blood pressure well-controlled. Current fatigue may be related to medication side effects rather than disease progression. - Monitor symptoms after reducing metoprolol  dose.  Fatigue Persistent fatigue affecting daily activities, potentially related to metoprolol  use. No immediate surgical interventions planned for knee issues contributing to fatigue. - Reduce metoprolol  dose to assess impact on energy levels.  Chronic kidney disease, unspecified Chronic kidney disease with last creatinine level at 1.87. Monitoring kidney function is essential, especially with new medication initiation. - Order BMP in mid to late July to monitor kidney function after starting Jardiance.  Gout Ankle swelling and pain, possibly related to gout, with symptoms including swelling and discomfort, particularly at night. - Monitor symptoms and consider further evaluation if symptoms persist or worsen.  Goal is to establish with [place] in Six months due to location  Gloriann Larger, MD FASE [facility] Cardiologist [facility]  [address] [place], [address] [place], [place] [address] [phone]  3:01 PM

## 2023-12-18 NOTE — Addendum Note (Signed)
 Addended by: Elyn Han on: 12/18/2023 05:17 PM   Modules accepted: Orders

## 2023-12-25 ENCOUNTER — Ambulatory Visit: Payer: Self-pay | Admitting: Pulmonary Disease

## 2024-01-18 NOTE — Telephone Encounter (Signed)
 Copied from CRM [id number]. Topic: Clinical - Lab/Test Results >> Jan 18, 2024  2:08 PM Leila BROCKS wrote: Reason for CRM: Patient [phone] states needs CT test results and needs to know the results and mailed to her home. Also does patient have a bill with the office. Patient states of the state and just came back home. Please advise and call back.  Called patient and relayed results.NFN.Will send message to the front as far as a bill goes

## 2024-01-24 ENCOUNTER — Other Ambulatory Visit: Payer: Self-pay

## 2024-01-24 DIAGNOSIS — J849 Interstitial pulmonary disease, unspecified: Secondary | ICD-10-CM

## 2024-01-24 NOTE — Progress Notes (Signed)
 Contacted patient who states the bill was from January, after research, this was ordered by cardiology. Informed patient to contact the number on the statement.  Patient also needed PFT, which was scheduled- please reorder PFT as the original was canceled.

## 2024-01-25 ENCOUNTER — Encounter

## 2024-01-25 DIAGNOSIS — B351 Tinea unguium: Secondary | ICD-10-CM | POA: Diagnosis not present

## 2024-01-25 DIAGNOSIS — E1142 Type 2 diabetes mellitus with diabetic polyneuropathy: Secondary | ICD-10-CM | POA: Diagnosis not present

## 2024-01-25 DIAGNOSIS — M79675 Pain in left toe(s): Secondary | ICD-10-CM | POA: Diagnosis not present

## 2024-01-25 DIAGNOSIS — L84 Corns and callosities: Secondary | ICD-10-CM | POA: Diagnosis not present

## 2024-01-25 DIAGNOSIS — M79674 Pain in right toe(s): Secondary | ICD-10-CM | POA: Diagnosis not present

## 2024-01-26 ENCOUNTER — Encounter

## 2024-01-29 ENCOUNTER — Telehealth: Payer: Self-pay

## 2024-01-29 NOTE — Telephone Encounter (Signed)
 Per chart Cardiology rx'd Jardiance , not Pulmonary. She will need to contact their office for recommendations.   LVM providing contact info to Cards office.

## 2024-01-29 NOTE — Telephone Encounter (Signed)
 Copied from CRM [id number]. Topic: Clinical - Medication Question >> Jan 29, 2024  1:58 PM [name] wrote: Reason for CRM: Patient would like to know if she needs to continue taking medication prescribed to her. She would like to know if Jardiance  needs to be taken until provider says to stop or if she needs to stop once she has taken it for two weeks. Please advise patient.

## 2024-02-05 DIAGNOSIS — M109 Gout, unspecified: Secondary | ICD-10-CM | POA: Diagnosis not present

## 2024-02-05 DIAGNOSIS — E1165 Type 2 diabetes mellitus with hyperglycemia: Secondary | ICD-10-CM | POA: Diagnosis not present

## 2024-02-05 DIAGNOSIS — Z1321 Encounter for screening for nutritional disorder: Secondary | ICD-10-CM | POA: Diagnosis not present

## 2024-02-05 DIAGNOSIS — I10 Essential (primary) hypertension: Secondary | ICD-10-CM | POA: Diagnosis not present

## 2024-02-05 DIAGNOSIS — R7989 Other specified abnormal findings of blood chemistry: Secondary | ICD-10-CM | POA: Diagnosis not present

## 2024-02-05 DIAGNOSIS — N184 Chronic kidney disease, stage 4 (severe): Secondary | ICD-10-CM | POA: Diagnosis not present

## 2024-02-05 DIAGNOSIS — Z131 Encounter for screening for diabetes mellitus: Secondary | ICD-10-CM | POA: Diagnosis not present

## 2024-02-05 DIAGNOSIS — D519 Vitamin B12 deficiency anemia, unspecified: Secondary | ICD-10-CM | POA: Diagnosis not present

## 2024-02-05 DIAGNOSIS — D559 Anemia due to enzyme disorder, unspecified: Secondary | ICD-10-CM | POA: Diagnosis not present

## 2024-02-05 DIAGNOSIS — Z13220 Encounter for screening for lipoid disorders: Secondary | ICD-10-CM | POA: Diagnosis not present

## 2024-02-08 ENCOUNTER — Ambulatory Visit: Admitting: Pulmonary Disease

## 2024-02-09 DIAGNOSIS — E1122 Type 2 diabetes mellitus with diabetic chronic kidney disease: Secondary | ICD-10-CM | POA: Diagnosis not present

## 2024-02-09 DIAGNOSIS — N184 Chronic kidney disease, stage 4 (severe): Secondary | ICD-10-CM | POA: Diagnosis not present

## 2024-02-09 DIAGNOSIS — I10 Essential (primary) hypertension: Secondary | ICD-10-CM | POA: Diagnosis not present

## 2024-02-09 DIAGNOSIS — Z6826 Body mass index (BMI) 26.0-26.9, adult: Secondary | ICD-10-CM | POA: Diagnosis not present

## 2024-02-09 DIAGNOSIS — I5189 Other ill-defined heart diseases: Secondary | ICD-10-CM | POA: Diagnosis not present

## 2024-02-13 ENCOUNTER — Other Ambulatory Visit (HOSPITAL_COMMUNITY): Payer: Self-pay | Admitting: Physician Assistant

## 2024-02-13 DIAGNOSIS — M810 Age-related osteoporosis without current pathological fracture: Secondary | ICD-10-CM

## 2024-02-19 ENCOUNTER — Ambulatory Visit (HOSPITAL_COMMUNITY)
Admission: RE | Admit: 2024-02-19 | Discharge: 2024-02-19 | Disposition: A | Discharge status: Home / Self Care | Source: Ambulatory Visit | Attending: Physician Assistant | Admitting: Physician Assistant

## 2024-02-19 DIAGNOSIS — M810 Age-related osteoporosis without current pathological fracture: Secondary | ICD-10-CM | POA: Insufficient documentation

## 2024-02-19 DIAGNOSIS — Z780 Asymptomatic menopausal state: Secondary | ICD-10-CM | POA: Diagnosis not present

## 2024-02-21 ENCOUNTER — Ambulatory Visit: Admitting: Pulmonary Disease

## 2024-02-21 ENCOUNTER — Encounter: Payer: Self-pay | Admitting: Pulmonary Disease

## 2024-02-21 VITALS — BP 122/82 | HR 65 | Ht 65.0 in | Wt 165.0 lb

## 2024-02-21 DIAGNOSIS — J849 Interstitial pulmonary disease, unspecified: Secondary | ICD-10-CM

## 2024-02-21 LAB — PULMONARY FUNCTION TEST
DL/VA % pred: 78 %
DL/VA: 3.14 ml/min/mmHg/L
DLCO unc % pred: 50 %
DLCO unc: 9.59 ml/min/mmHg
FEF 25-75 Post: 1.87 L/s
FEF 25-75 Pre: 1.53 L/s
FEF2575-%Change-Post: 22 %
FEF2575-%Pred-Post: 169 %
FEF2575-%Pred-Pre: 138 %
FEV1-%Change-Post: 1 %
FEV1-%Pred-Post: 99 %
FEV1-%Pred-Pre: 97 %
FEV1-Post: 1.77 L
FEV1-Pre: 1.74 L
FEV1FVC-%Change-Post: 7 %
FEV1FVC-%Pred-Pre: 108 %
FEV6-%Change-Post: -1 %
FEV6-%Pred-Post: 92 %
FEV6-%Pred-Pre: 93 %
FEV6-Post: 2.09 L
FEV6-Pre: 2.12 L
FEV6FVC-%Pred-Post: 106 %
FEV6FVC-%Pred-Pre: 106 %
FVC-%Change-Post: -5 %
FVC-%Pred-Post: 86 %
FVC-%Pred-Pre: 91 %
FVC-Post: 2.09 L
FVC-Pre: 2.21 L
Post FEV1/FVC ratio: 85 %
Post FEV6/FVC ratio: 100 %
Pre FEV1/FVC ratio: 78 %
Pre FEV6/FVC Ratio: 100 %
RV % pred: 65 %
RV: 1.7 L
TLC % pred: 71 %
TLC: 3.7 L

## 2024-02-21 NOTE — Patient Instructions (Signed)
 Full pft performed today

## 2024-02-21 NOTE — Progress Notes (Signed)
 Full pft performed today

## 2024-02-21 NOTE — Patient Instructions (Signed)
  VISIT SUMMARY: Today, we discussed your persistent cough, nasal drip, and other health concerns. We reviewed your history of interstitial lung disease, specifically pulmonary fibrosis, and your recent bone density scan results. We also talked about your shoulder and knee pain, occasional sciatica, and mild heart failure. We made plans to address each of these issues and provided you with information on potential treatments.  YOUR PLAN: -IDIOPATHIC PULMONARY FIBROSIS: Idiopathic pulmonary fibrosis (IPF) is a condition where the lungs become scarred over time, making it hard to breathe. Your recent CT scan shows that your condition is stable compared to last year, but there has been overall progression since 2017. We will discuss your diagnosis and treatment options with your family, provide you with information on medications like nintedanib and pirfenidone, and consider starting medication sooner if you wish. Your case will also be reviewed at a conference for further confirmation of the diagnosis.  -CHRONIC COUGH AND POSTNASAL DRIP: Your persistent cough and clear nasal drip, especially during meals, are likely related to your pulmonary fibrosis and postnasal drip. We will continue to monitor these symptoms and adjust your treatment as needed.  -OSTEOPOROSIS: Osteoporosis is a condition where bones become weak and brittle. Your recent bone density scan indicates that you have osteoporosis, but no fractures were noted. We suspect osteoarthritis rather than rheumatoid arthritis based on your lab tests. You will be referred to a rheumatologist for further evaluation and potential assessment for rheumatoid arthritis.  INSTRUCTIONS: Please discuss the diagnosis and treatment options for idiopathic pulmonary fibrosis with your family. Review the handout on nintedanib and pirfenidone that we provided. If you decide to start medication sooner, let us  know so we can complete the necessary paperwork. We will  also review your case at a conference for further confirmation of the diagnosis. Additionally, you will be referred to a rheumatologist for joint evaluation and potential rheumatoid arthritis assessment.

## 2024-02-21 NOTE — Progress Notes (Signed)
 [name]    [id number]    Nov 26, 1934  Primary Care Physician:Vyas, [name], [name]  Referring Physician: Rosamond [name], [name] [address] [place],  [place] [address]  Chief complaint: Follow-up for interstitial lung disease  HPI: 89 y.o. who  has a past medical history of CAD (coronary artery disease), Constipation, Cramps, extremity, Diabetes (HCC), Enterococcus UTI (02/03/2016), H/O cardiovascular stress test (03/05/2009), H/O Doppler ultrasound, HTN (hypertension), Murmur, cardiac, Myocardial infarction [facility]), Pancreatic cyst, Pneumonia, Postoperative anemia due to acute blood loss (02/02/2016), Primary localized osteoarthritis of right knee, Recurrent upper respiratory infection (URI), and TIA (transient ischemic attack).  Discussed the use of AI scribe software for clinical note transcription with the patient, who gave verbal consent to proceed.  History of Present Illness [name] is an 89 y.o. female with interstitial lung disease who presents with shortness of breath and balance issues. She was referred by Dr. Darlean for evaluation of interstitial lung disease noted on a CT scan.  She experiences shortness of breath during ambulation, which has been persistent for an unspecified duration. The shortness of breath does not worsen over time, except when eating, which she associates with a constant cough. She also reports balance issues, describing herself as 'not very steady,' and notes that walking is fatiguing.  She has a dry cough and nasal drip, particularly in the morning, described as 'cold and clear' without mucus. The cough becomes more persistent when she eats.  Her past medical history includes heart disease, a heart attack in 2023, diabetes, hypertension, a pancreatic cyst with EUS and biopsy, and a TIA. She also had COVID-19 approximately two to three years ago, which she recovered from without hospitalization.  She denies any history of smoking and reports  no current exposure to pets, mold, or hot tubs. She worked for [employer], where she was exposed to nylon fibers in the air, but not directly to asbestos.  She has a history of rheumatoid arthritis diagnosed three years ago, although she has not had blood tests to confirm this diagnosis and is not on any medication for it. She experiences joint pain, particularly in her shoulders and knees, with her fingers being swollen and deviated, and she gets cramps in her fingers.  She reports sciatica, feeling that her left hip is higher than her right, which affects her gait and necessitates the use of a cane. She also experiences bruising on her legs and back, particularly when getting in and out of the car and when sleeping.  Interval history: [name] is an 89 year old female with interstitial lung disease who presents with persistent cough and nasal drip.  Cough and upper airway symptoms - Persistent cough and clear nasal drip, particularly noticeable when eating - Symptoms have been ongoing and refractory to prior attempts at resolution  Exertional dyspnea and functional limitation - History of interstitial lung disease, specifically pulmonary fibrosis, first noted in 2017 - Unable to vacuum a whole room at one time and must take breaks due to shortness of breath  Musculoskeletal pain and mobility issues - Shoulder pain and knee issues - Difficulty with walking, possibly related to knee problems - History of falling in December without sustaining fractures - Recent bone density scan revealed osteoporosis  Sciatica - Occasional symptoms of sciatica, though not present in the past month  Heart failure and medication use - Jardiance  prescribed by cardiologist for mild heart failure - Uncertainty regarding current instructions for  Jardiance  use    Relevant Pulmonary History Pets: Used to have a dog Occupation: Worked in [employer] Exposures: Exposure to nylon dust and fiber  while working.  No ongoing exposures to mold, hot tub, Jacuzzi.  No feather pillows or comforters Smoking history: Never smoker Travel history: Originally from [place] .  No significant recent travel Relevant family history: No family history of lung disease  Outpatient Encounter Medications as of 02/21/2024  Medication Sig   allopurinol  (ZYLOPRIM ) 100 MG tablet Take 1 tablet (100 mg total) by mouth daily.   aspirin  EC 81 MG tablet Take 81 mg by mouth daily. Swallow whole.   atorvastatin (LIPITOR) 80 MG tablet Take 80 mg by mouth daily.   cyanocobalamin  (VITAMIN B12) 1000 MCG tablet Take 1,000 mcg by mouth daily.   empagliflozin  (JARDIANCE ) 10 MG TABS tablet Take 1 tablet (10 mg total) by mouth daily before breakfast.   empagliflozin  (JARDIANCE ) 10 MG TABS tablet Take 1 tablet (10 mg total) by mouth daily before breakfast.   furosemide  (LASIX ) 20 MG tablet Take 1 tablet (20 mg total) by mouth daily.   gabapentin  (NEURONTIN ) 100 MG capsule Take 100 mg by mouth 4 (four) times daily.   glipiZIDE (GLUCOTROL XL) 5 MG 24 hr tablet Take 5 mg by mouth 2 (two) times daily.   ipratropium (ATROVENT ) 0.03 % nasal spray Place 2 sprays in each nostril up to three times daily as needed for runny nose   latanoprost (XALATAN) 0.005 % ophthalmic solution Place 1 drop into both eyes at bedtime.   metoprolol  succinate (TOPROL  XL) 25 MG 24 hr tablet Take 0.5 tablets (12.5 mg total) by mouth daily.   nitroGLYCERIN  (NITROSTAT ) 0.4 MG SL tablet DISSOLVE 1 TABLET UNDER THE TONGUE EVERY 5 MINUTES AS NEEDED FOR CHEST PAIN. DO NOT EXCEED A TOTAL OF 3 DOSES IN 15 MINUTES.   pantoprazole  (PROTONIX ) 40 MG tablet Take 1 tablet (40 mg total) by mouth daily before breakfast.   valsartan  (DIOVAN ) 160 MG tablet TAKE 1 TABLET BY MOUTH EVERY DAY   No facility-administered encounter medications on file as of 02/21/2024.   Vitals:   02/21/24 1357  BP: 122/82  Pulse: 65  Height: 5' 5 (1.651 m)  Weight: 165 lb (74.8 kg)  SpO2: 97%   BMI (Calculated): 27.46    Physical Exam GEN: No acute distress. CV: Regular rate and rhythm, no murmurs. LUNGS: Crackles heard due to scarring. SKIN JOINTS: Warm and dry, no rash.    Data Reviewed: Imaging: CT chest 12/10/2021-chronic interstitial lung disease worse compared to 2017 CT high-resolution 08/31/2022-interstitial lung disease and probable UIP pattern.  Progressive changes noted CT high-resolution 12/13/2023-stable/minimally progressive interstitial lung disease I reviewed the images personally.  PFTs: 02/21/2024 FVC 2.09 [86%], FEV1 1.77 [9 9%], F/F85, TLC 3.70 [71%], DLCO 7.59 [50%] Mild restriction, moderate diffusion defect  Labs: CTD serologies 11/14/2023-ANA 1: 80 cytoplasmic, negative RF and CCP  Assessment and Plan Assessment & Plan Idiopathic pulmonary fibrosis Progressive scarring process in the lungs since 2017, with current CT scan showing stability compared to last year but overall progression since 2017. Lung function tests indicate mild reduction in lung volumes and moderate reduction in diffusion capacity, causing restriction in lung function.  Although she was told that she has rheumatoid arthritis this was not evaluated by rheumatology and rheumatoid factor, CCP are negative.  I suspect her joint symptoms are due to osteoarthritis. High confidence in diagnosis of idiopathic pulmonary fibrosis (IPF), pending further confirmation at multidisciplinary ILD conference. - Discuss  diagnosis and treatment options with family. - Provide handout on nintedanib and pirfenidone. - Consider starting medication sooner if desired, with necessary paperwork. - Review case at conference for further confirmation of diagnosis.  Chronic cough and postnasal drip Persistent cough and clear nasal drip, especially during meals, likely related to underlying pulmonary fibrosis and postnasal drip.  Joint pain Suspected osteoarthritis rather than rheumatoid arthritis due to negative  lab tests. - Refer to rheumatologist for joint evaluation and potential rheumatoid arthritis assessment.  Recommendations: Discussion about antifibrotic treatment Rheumatology referral  Dyonna Jaspers [name] [facility] 02/21/2024, 1:50 PM  CC: Rosamond [name], [name]  Yes office notes note the last 2 years

## 2024-02-22 ENCOUNTER — Telehealth: Payer: Self-pay | Admitting: Internal Medicine

## 2024-02-22 NOTE — Telephone Encounter (Signed)
 Pt was going to restart Jardiance  after returning from [place], but then she had to stop all meds ahead of her bone density test which was on 02/19/24.  I adv I would send to Dr. Santo and then we will let her know.  She is agreeable w waiting and thanked me for call.

## 2024-02-22 NOTE — Telephone Encounter (Signed)
 Pt c/o medication issue:  1. Name of Medication: empagliflozin  (JARDIANCE ) 10 MG TABS tablet   2. How are you currently taking this medication (dosage and times per day)? As written   3. Are you having a reaction (difficulty breathing--STAT)? No   4. What is your medication issue? Pt called in asking if she needs to continue taking this medication.  Please advise.

## 2024-02-22 NOTE — Telephone Encounter (Signed)
 Called patient and let her know.  Shlomo Wilbert SAUNDERS, MD to Me  Santo Stanly LABOR, MD  Randy Hamp SAILOR, RN (Selected Message    02/22/24  5:42 PM Yes she needs to restart it - she needs to watch for UTIs and make sure she stays well hydrated on the Jardiance 

## 2024-03-01 ENCOUNTER — Encounter: Payer: Self-pay | Admitting: Radiology

## 2024-03-27 ENCOUNTER — Telehealth: Payer: Self-pay | Admitting: Internal Medicine

## 2024-03-27 MED ORDER — EMPAGLIFLOZIN 10 MG PO TABS: 10.0000 mg | ORAL_TABLET | Freq: Every day | ORAL | 2 refills | Status: AC

## 2024-03-27 NOTE — Telephone Encounter (Signed)
*  STAT* If patient is at the pharmacy, call can be transferred to refill team.   1. Which medications need to be refilled? (please list name of each medication and dose if known)   empagliflozin  (JARDIANCE ) 10 MG TABS tablet    4. Which pharmacy/location (including street and city if local pharmacy) is medication to be sent to?  [facility] - [place] - [address]     5. Do they need a 30 day or 90 day supply? 90

## 2024-03-27 NOTE — Telephone Encounter (Signed)
 Pt's medication was sent to pt's pharmacy as requested. Confirmation received.

## 2024-04-04 DIAGNOSIS — M79674 Pain in right toe(s): Secondary | ICD-10-CM | POA: Diagnosis not present

## 2024-04-04 DIAGNOSIS — M79675 Pain in left toe(s): Secondary | ICD-10-CM | POA: Diagnosis not present

## 2024-04-04 DIAGNOSIS — L84 Corns and callosities: Secondary | ICD-10-CM | POA: Diagnosis not present

## 2024-04-04 DIAGNOSIS — E1142 Type 2 diabetes mellitus with diabetic polyneuropathy: Secondary | ICD-10-CM | POA: Diagnosis not present

## 2024-04-04 DIAGNOSIS — B351 Tinea unguium: Secondary | ICD-10-CM | POA: Diagnosis not present

## 2024-04-25 ENCOUNTER — Encounter: Payer: Self-pay | Admitting: Internal Medicine

## 2024-04-29 DIAGNOSIS — Z961 Presence of intraocular lens: Secondary | ICD-10-CM | POA: Diagnosis not present

## 2024-04-29 DIAGNOSIS — H401432 Capsular glaucoma with pseudoexfoliation of lens, bilateral, moderate stage: Secondary | ICD-10-CM | POA: Diagnosis not present

## 2024-05-08 DIAGNOSIS — M25561 Pain in right knee: Secondary | ICD-10-CM | POA: Diagnosis not present

## 2024-05-08 DIAGNOSIS — Z13220 Encounter for screening for lipoid disorders: Secondary | ICD-10-CM | POA: Diagnosis not present

## 2024-05-08 DIAGNOSIS — E1165 Type 2 diabetes mellitus with hyperglycemia: Secondary | ICD-10-CM | POA: Diagnosis not present

## 2024-05-08 DIAGNOSIS — E1122 Type 2 diabetes mellitus with diabetic chronic kidney disease: Secondary | ICD-10-CM | POA: Diagnosis not present

## 2024-05-08 DIAGNOSIS — M25562 Pain in left knee: Secondary | ICD-10-CM | POA: Diagnosis not present

## 2024-05-08 DIAGNOSIS — I10 Essential (primary) hypertension: Secondary | ICD-10-CM | POA: Diagnosis not present

## 2024-05-08 DIAGNOSIS — D559 Anemia due to enzyme disorder, unspecified: Secondary | ICD-10-CM | POA: Diagnosis not present

## 2024-05-08 DIAGNOSIS — N184 Chronic kidney disease, stage 4 (severe): Secondary | ICD-10-CM | POA: Diagnosis not present

## 2024-05-08 DIAGNOSIS — E559 Vitamin D deficiency, unspecified: Secondary | ICD-10-CM | POA: Diagnosis not present

## 2024-05-13 ENCOUNTER — Encounter: Payer: Self-pay | Admitting: Radiology

## 2024-05-13 DIAGNOSIS — E1122 Type 2 diabetes mellitus with diabetic chronic kidney disease: Secondary | ICD-10-CM | POA: Diagnosis not present

## 2024-05-13 DIAGNOSIS — G8929 Other chronic pain: Secondary | ICD-10-CM | POA: Diagnosis not present

## 2024-05-13 DIAGNOSIS — I10 Essential (primary) hypertension: Secondary | ICD-10-CM | POA: Diagnosis not present

## 2024-05-13 DIAGNOSIS — I5189 Other ill-defined heart diseases: Secondary | ICD-10-CM | POA: Diagnosis not present

## 2024-05-13 DIAGNOSIS — M109 Gout, unspecified: Secondary | ICD-10-CM | POA: Diagnosis not present

## 2024-05-13 DIAGNOSIS — Z6826 Body mass index (BMI) 26.0-26.9, adult: Secondary | ICD-10-CM | POA: Diagnosis not present

## 2024-05-13 DIAGNOSIS — I252 Old myocardial infarction: Secondary | ICD-10-CM | POA: Diagnosis not present

## 2024-05-13 DIAGNOSIS — M25561 Pain in right knee: Secondary | ICD-10-CM | POA: Diagnosis not present

## 2024-05-13 DIAGNOSIS — K219 Gastro-esophageal reflux disease without esophagitis: Secondary | ICD-10-CM | POA: Diagnosis not present

## 2024-05-13 DIAGNOSIS — N184 Chronic kidney disease, stage 4 (severe): Secondary | ICD-10-CM | POA: Diagnosis not present

## 2024-05-23 ENCOUNTER — Ambulatory Visit: Admitting: Internal Medicine

## 2024-05-23 ENCOUNTER — Encounter: Payer: Self-pay | Admitting: Pulmonary Disease

## 2024-05-23 ENCOUNTER — Ambulatory Visit: Admitting: Pulmonary Disease

## 2024-05-23 VITALS — BP 139/83 | HR 60 | Ht 67.0 in | Wt 166.0 lb

## 2024-05-23 DIAGNOSIS — J849 Interstitial pulmonary disease, unspecified: Secondary | ICD-10-CM

## 2024-05-23 DIAGNOSIS — R0982 Postnasal drip: Secondary | ICD-10-CM

## 2024-05-23 DIAGNOSIS — R053 Chronic cough: Secondary | ICD-10-CM

## 2024-05-23 DIAGNOSIS — J84112 Idiopathic pulmonary fibrosis: Secondary | ICD-10-CM

## 2024-05-23 NOTE — Progress Notes (Addendum)
 [name]    [id number]    1934-08-18  Primary Care Physician:Vyas, Leta [name], MD  Referring Physician: Rosamond Leta [name], MD [address] [place],  [place] [address]  Chief complaint:  Follow-up for IPF   HPI: 89 y.o. who  has a past medical history of CAD (coronary artery disease), Constipation, Cramps, extremity, Diabetes (HCC), Enterococcus UTI (02/03/2016), H/O cardiovascular stress test (03/05/2009), H/O Doppler ultrasound, HTN (hypertension), Murmur, cardiac, Myocardial infarction [facility]), Pancreatic cyst, Pneumonia, Postoperative anemia due to acute blood loss (02/02/2016), Primary localized osteoarthritis of right knee, Recurrent upper respiratory infection (URI), and TIA (transient ischemic attack).  Discussed the use of AI scribe software for clinical note transcription with the patient, who gave verbal consent to proceed.  History of Present Illness [name] is an 89 y.o. female with interstitial lung disease who presents with shortness of breath and balance issues. She was referred by Dr. Darlean for evaluation of interstitial lung disease noted on a CT scan.  She experiences shortness of breath during ambulation, which has been persistent for an unspecified duration. The shortness of breath does not worsen over time, except when eating, which she associates with a constant cough. She also reports balance issues, describing herself as 'not very steady,' and notes that walking is fatiguing.  She has a dry cough and nasal drip, particularly in the morning, described as 'cold and clear' without mucus. The cough becomes more persistent when she eats.  Her past medical history includes heart disease, a heart attack in 2023, diabetes, hypertension, a pancreatic cyst with EUS and biopsy, and a TIA. She also had COVID-19 approximately two to three years ago, which she recovered from without hospitalization.  She denies any history of smoking and reports no current  exposure to pets, mold, or hot tubs. She worked for [employer], where she was exposed to nylon fibers in the air, but not directly to asbestos.  She has a questionable history of rheumatoid arthritis diagnosed three years ago, although she has not had blood tests to confirm this diagnosis and is not on any medication for it. She experiences joint pain, particularly in her shoulders and knees, with her fingers being swollen and deviated, and she gets cramps in her fingers.  She reports sciatica, feeling that her left hip is higher than her right, which affects her gait and necessitates the use of a cane. She also experiences bruising on her legs and back, particularly when getting in and out of the car and when sleeping.  Interval history: [name] is an 89 year old female with idiopathic pulmonary fibrosis who presents for follow-up of her lung condition.  Pulmonary fibrosis symptoms and disease course - Diagnosed with idiopathic pulmonary fibrosis in 2017 based on CT imaging showing mild scarring - No changes in respiratory status since last visit - No new respiratory symptoms reported  Pharmacologic management considerations - Currently not on any pharmacologic treatment for idiopathic pulmonary fibrosis - Considering daily oral medication options for IPF - Aware of potential side effects of therapy, including diarrhea - Aware of need for liver function monitoring with some medications    Relevant Pulmonary History Pets: Used to have a dog Occupation: Worked in [employer] Exposures: Exposure to nylon dust and fiber while working.  No ongoing exposures to mold, hot tub, Jacuzzi.  No feather pillows or comforters Smoking history: Never smoker Travel history: Originally from [place] .  No significant recent travel Relevant family  history: No family history of lung disease  Outpatient Encounter Medications as of 05/23/2024  Medication Sig   allopurinol  (ZYLOPRIM ) 100 MG tablet  Take 1 tablet (100 mg total) by mouth daily.   aspirin  EC 81 MG tablet Take 81 mg by mouth daily. Swallow whole.   atorvastatin (LIPITOR) 80 MG tablet Take 80 mg by mouth daily.   cyanocobalamin  (VITAMIN B12) 1000 MCG tablet Take 1,000 mcg by mouth daily.   empagliflozin  (JARDIANCE ) 10 MG TABS tablet Take 1 tablet (10 mg total) by mouth daily before breakfast.   empagliflozin  (JARDIANCE ) 10 MG TABS tablet Take 1 tablet (10 mg total) by mouth daily before breakfast.   furosemide  (LASIX ) 20 MG tablet Take 1 tablet (20 mg total) by mouth daily.   gabapentin  (NEURONTIN ) 100 MG capsule Take 100 mg by mouth 4 (four) times daily.   glipiZIDE (GLUCOTROL XL) 5 MG 24 hr tablet Take 5 mg by mouth 2 (two) times daily.   ipratropium (ATROVENT ) 0.03 % nasal spray Place 2 sprays in each nostril up to three times daily as needed for runny nose   latanoprost (XALATAN) 0.005 % ophthalmic solution Place 1 drop into both eyes at bedtime.   metoprolol  succinate (TOPROL  XL) 25 MG 24 hr tablet Take 0.5 tablets (12.5 mg total) by mouth daily.   nitroGLYCERIN  (NITROSTAT ) 0.4 MG SL tablet DISSOLVE 1 TABLET UNDER THE TONGUE EVERY 5 MINUTES AS NEEDED FOR CHEST PAIN. DO NOT EXCEED A TOTAL OF 3 DOSES IN 15 MINUTES.   pantoprazole  (PROTONIX ) 40 MG tablet Take 1 tablet (40 mg total) by mouth daily before breakfast.   valsartan  (DIOVAN ) 160 MG tablet TAKE 1 TABLET BY MOUTH EVERY DAY   No facility-administered encounter medications on file as of 05/23/2024.   Vitals:   05/23/24 1340  BP: 139/83  Pulse: 60  Height: 5' 7 (1.702 m)  Weight: 166 lb (75.3 kg)  SpO2: 95%  TempSrc: Oral  BMI (Calculated): 25.99    Physical Exam GEN: No acute distress CV: Regular rate and rhythm no murmurs LUNGS: Clear to auscultation bilaterally normal respiratory effort SKIN JOINTS: Warm and dry no rash    Data Reviewed: Imaging: CT chest 12/10/2021-chronic interstitial lung disease worse compared to 2017 CT high-resolution  08/31/2022-interstitial lung disease and probable UIP pattern.  Progressive changes noted CT high-resolution 12/13/2023-stable/minimally progressive interstitial lung disease I reviewed the images personally.  PFTs: 02/21/2024 FVC 2.09 [86%], FEV1 1.77 [9 9%], F/F85, TLC 3.70 [71%], DLCO 7.59 [50%] Mild restriction, moderate diffusion defect  Labs: CTD serologies 11/14/2023-ANA 1: 80 cytoplasmic, negative RF and CCP  Assessment and Plan Assessment & Plan Idiopathic pulmonary fibrosis Progressive scarring process in the lungs since 2017, with current CT scan showing stability compared to last year but overall progression since 2017. Lung function tests indicate mild reduction in lung volumes and moderate reduction in diffusion capacity, causing restriction in lung function.  Although she was told that she has rheumatoid arthritis this was not evaluated by rheumatology and rheumatoid factor, CCP are negative.  We have referred her to rheumatology clinic but has not been completed yet.  I suspect her joint symptoms are due to osteoarthritis. High confidence in diagnosis of idiopathic pulmonary fibrosis (IPF)  Discussed treatment options to slow disease progression, including medications that do not cure but can slow progression. Newer medication,  nerandomilast (Jascayd), preferred due to lack of liver monitoring requirement, though insurance approval may be an issue. Common side effect is diarrhea, manageable with Imodium and dietary adjustments.  No significant drug interactions with current medications. - Started  nerandomilast twice daily. - Monitor for diarrhea and manage with Imodium and dietary adjustments. - Sent referral to pharmacy for [facility] paperwork. - If insurance denies , will consider alternative medication nintedanib  Recommendations: Initiate nerandomilast  I personally spent a total of 40 minutes in the care of the patient today including preparing to see the patient,  getting/reviewing separately obtained history, performing a medically appropriate exam/evaluation, counseling and educating, placing orders, communicating results, and coordinating care.   Lonna Coder MD [facility] 05/23/2024, 1:43 PM  CC: Rosamond Leta [name], MD

## 2024-05-23 NOTE — Patient Instructions (Addendum)
  VISIT SUMMARY: You had a follow-up visit to discuss your idiopathic pulmonary fibrosis. We reviewed your current condition and discussed starting a new medication to help manage your symptoms.  YOUR PLAN: IDIOPATHIC PULMONARY FIBROSIS: You have chronic idiopathic pulmonary fibrosis, which causes scarring in your lungs and reduces lung capacity. Your condition was diagnosed in 2017 and has shown some progression over time. -We will started you on a new medication called nerandomilast (Jascayd) to help slow the progression of your disease. You will take this medication twice daily. -Be aware that a common side effect of Jascate is diarrhea. You can manage this with Imodium and dietary adjustments. -We have sent a referral to the pharmacy to handle the paperwork. If your insurance does not approve , we will consider an alternative medication called nintedanib

## 2024-05-24 ENCOUNTER — Telehealth: Payer: Self-pay

## 2024-05-24 NOTE — Telephone Encounter (Signed)
 Received referral for new start Jascayd. If enrollment form is needed for signature we can route to clinical team for assistance.

## 2024-05-27 ENCOUNTER — Other Ambulatory Visit (HOSPITAL_COMMUNITY): Payer: Self-pay

## 2024-05-27 NOTE — Telephone Encounter (Signed)
 Submitted a Prior Authorization request to [facility] ADVANTAGE/RX ADVANCE for JASCAYD via CoverMyMeds. Will update once we receive a response.  Key: [id number]

## 2024-05-27 NOTE — Telephone Encounter (Signed)
 Received notification from [facility] ADVANTAGE/RX ADVANCE regarding a prior authorization for JASCAYD. Authorization has been APPROVED from 05/27/24 to 07/10/25. Approval letter sent to scan center.  Per test claim, copay for 30 days supply is $661.12  Patient can fill through [facility]: [phone]   Authorization # [id number] Phone # [phone]  Will explore pursuing PAP

## 2024-05-30 ENCOUNTER — Other Ambulatory Visit (HOSPITAL_COMMUNITY): Payer: Self-pay

## 2024-05-30 ENCOUNTER — Ambulatory Visit: Attending: Pulmonary Disease

## 2024-05-30 DIAGNOSIS — J849 Interstitial pulmonary disease, unspecified: Secondary | ICD-10-CM

## 2024-05-30 MED ORDER — JASCAYD 18 MG PO TABS: 18.0000 mg | ORAL_TABLET | Freq: Two times a day (BID) | ORAL | 3 refills | Status: AC

## 2024-05-30 NOTE — Telephone Encounter (Signed)
 Completed new start Jascayd counseling in pharmacotherapy encounter.

## 2024-05-30 NOTE — Telephone Encounter (Signed)
 Pt enrolled in Pulmonary Fibrosis Grant through [employer]:  Amount: $4000 Award Period: 12/02/23-05/30/25 BIN: [id number] PCN: [id number] Group: [id number] ID: [id number]  For pharmacy inquiries, contact PDMI at [phone]. For patient inquiries, contact PAF at [phone].  The patient provided consent for the team to submit a grant application on their behalf if funding opportunities become available. They understand that wait times may vary depending on fund availability, and that placement on the waitlist does not guarantee that funding will be awarded.

## 2024-05-30 NOTE — Progress Notes (Signed)
 [facility]  Referring Provider: Dr. Theophilus  Virtual Visit via Telephone Note  I connected with [name] on 05/31/24 at  2:30 PM EST by telephone and verified that I am speaking with the correct person using two identifiers.  Location: Patient: home Provider: office   I discussed the limitations, risks, security and privacy concerns of performing an evaluation and management service by telephone and the availability of in person appointments. I also discussed with the patient that there may be a patient responsible charge related to this service. The patient expressed understanding and agreed to proceed.  Subjective:  Patient called today by [facility] team for Jascayd new start.   Patient was last seen by Dr. Theophilus on 05/23/24. She has IPF. No prior antifibrotic therapies.  Objective: Allergies  Allergen Reactions   Latex Itching   Other Itching    Chest monitor adhesives    Codeine Nausea Only   Milk-Related Compounds Diarrhea    Outpatient Encounter Medications as of 05/24/2024  Medication Sig   allopurinol  (ZYLOPRIM ) 100 MG tablet Take 1 tablet (100 mg total) by mouth daily.   aspirin  EC 81 MG tablet Take 81 mg by mouth daily. Swallow whole.   atorvastatin (LIPITOR) 80 MG tablet Take 80 mg by mouth daily.   cyanocobalamin  (VITAMIN B12) 1000 MCG tablet Take 1,000 mcg by mouth daily.   empagliflozin  (JARDIANCE ) 10 MG TABS tablet Take 1 tablet (10 mg total) by mouth daily before breakfast.   empagliflozin  (JARDIANCE ) 10 MG TABS tablet Take 1 tablet (10 mg total) by mouth daily before breakfast.   furosemide  (LASIX ) 20 MG tablet Take 1 tablet (20 mg total) by mouth daily.   gabapentin  (NEURONTIN ) 100 MG capsule Take 100 mg by mouth 4 (four) times daily.   glipiZIDE (GLUCOTROL XL) 5 MG 24 hr tablet Take 5 mg by mouth 2 (two) times daily.   ipratropium (ATROVENT ) 0.03 % nasal spray Place 2 sprays in each nostril up to three  times daily as needed for runny nose   latanoprost (XALATAN) 0.005 % ophthalmic solution Place 1 drop into both eyes at bedtime.   metoprolol  succinate (TOPROL  XL) 25 MG 24 hr tablet Take 0.5 tablets (12.5 mg total) by mouth daily.   nitroGLYCERIN  (NITROSTAT ) 0.4 MG SL tablet DISSOLVE 1 TABLET UNDER THE TONGUE EVERY 5 MINUTES AS NEEDED FOR CHEST PAIN. DO NOT EXCEED A TOTAL OF 3 DOSES IN 15 MINUTES.   pantoprazole  (PROTONIX ) 40 MG tablet Take 1 tablet (40 mg total) by mouth daily before breakfast.   valsartan  (DIOVAN ) 160 MG tablet TAKE 1 TABLET BY MOUTH EVERY DAY   No facility-administered encounter medications on file as of 05/24/2024.     Immunization History  Administered Date(s) Administered   Fluzone Influenza virus vaccine,trivalent (IIV3), split virus 04/03/2021   INFLUENZA, HIGH DOSE SEASONAL PF 04/11/2019   Influenza-Unspecified 04/03/2021   Moderna Sars-Covid-2 Vaccination 10/05/2019, 11/02/2019, 05/26/2020, 11/03/2020      PFT's TLC  Date Value Ref Range Status  02/21/2024 3.70 L Final     Labs - (obtained with PCP office)  eGFR 26 09/04/23 eGFR 27 07/13/23 eGFR 29 02/06/23  Assessment and Plan  Jascayd Medication Management Thoroughly counseled patient on the efficacy, mechanism of action, dosing, administration, adverse effects, and monitoring parameters of Jascayd.  Patient verbalized understanding.   Goals of Therapy: Will not stop or reverse the progression of ILD. It will slow the progression of ILD.   Dosing: Recommended dose will be  18mg  1 tablet twice daily. May be administered with or without regard to food.   Adverse Effects: Weight loss (nerandomilast monotherapy: 8%; background nintedanib: 14-16%; background pirfenidone: 6%) Decreased appetite (nerandomilast monotherapy: 6% to 9%; concomitant nintedanib: 7% to 10%; concomitant pirfenidone: 13%) Diarrhea (nerandomilast monotherapy: 17% to 26%; concomitant nintedanib: 50% to 62%; concomitant pirfenidone:  24%)  Monitoring: Monitor for diarrhea, decreased appetite, weight loss  Drug interactions: nerandomilast is a major substrate of CYP3A4. Avoid use of moderate and strong CYP3A4 inducers or inhibitors.   Access: Approval of Jascayd through: programme researcher, broadcasting/film/video Rx sent to: [facility]: [phone]   Medication Reconciliation A drug regimen assessment was performed, including review of allergies, interactions, disease-state management, dosing and immunization history. Medications were reviewed with the patient, including name, instructions, indication, goals of therapy, potential side effects, importance of adherence, and safe use.  PLAN:  1) START Jascayd 18mg  1 tablet twice daily. 2) Follow-up with Dr. Theophilus as planned on 09/07/24 3) Call our office if any issues or concerns come up.  This appointment required 30 minutes of patient care (this includes precharting, chart review, review of results, virtual care, etc.).  Thank you for involving pharmacy to assist in providing this patient's care.   [name], PharmD, BCPS, CPP Clinical Pharmacist  [facility]

## 2024-05-31 DIAGNOSIS — M545 Low back pain, unspecified: Secondary | ICD-10-CM | POA: Diagnosis not present

## 2024-05-31 DIAGNOSIS — M25562 Pain in left knee: Secondary | ICD-10-CM | POA: Diagnosis not present

## 2024-05-31 DIAGNOSIS — M25561 Pain in right knee: Secondary | ICD-10-CM | POA: Diagnosis not present

## 2024-05-31 DIAGNOSIS — Z96651 Presence of right artificial knee joint: Secondary | ICD-10-CM | POA: Diagnosis not present

## 2024-05-31 DIAGNOSIS — R2689 Other abnormalities of gait and mobility: Secondary | ICD-10-CM | POA: Diagnosis not present

## 2024-06-03 ENCOUNTER — Other Ambulatory Visit (HOSPITAL_COMMUNITY): Payer: Self-pay | Admitting: Orthopedic Surgery

## 2024-06-03 DIAGNOSIS — M545 Low back pain, unspecified: Secondary | ICD-10-CM

## 2024-06-13 DIAGNOSIS — L84 Corns and callosities: Secondary | ICD-10-CM | POA: Diagnosis not present

## 2024-06-13 DIAGNOSIS — B351 Tinea unguium: Secondary | ICD-10-CM | POA: Diagnosis not present

## 2024-06-13 DIAGNOSIS — E1142 Type 2 diabetes mellitus with diabetic polyneuropathy: Secondary | ICD-10-CM | POA: Diagnosis not present

## 2024-06-13 DIAGNOSIS — M79674 Pain in right toe(s): Secondary | ICD-10-CM | POA: Diagnosis not present

## 2024-06-13 DIAGNOSIS — M79675 Pain in left toe(s): Secondary | ICD-10-CM | POA: Diagnosis not present

## 2024-06-15 ENCOUNTER — Ambulatory Visit (HOSPITAL_COMMUNITY)
Admission: RE | Admit: 2024-06-15 | Discharge: 2024-06-15 | Attending: Orthopedic Surgery | Admitting: Orthopedic Surgery

## 2024-06-15 DIAGNOSIS — M545 Low back pain, unspecified: Secondary | ICD-10-CM

## 2024-06-15 DIAGNOSIS — M47816 Spondylosis without myelopathy or radiculopathy, lumbar region: Secondary | ICD-10-CM | POA: Diagnosis not present

## 2024-06-15 DIAGNOSIS — M4805 Spinal stenosis, thoracolumbar region: Secondary | ICD-10-CM | POA: Diagnosis not present

## 2024-06-15 DIAGNOSIS — M5125 Other intervertebral disc displacement, thoracolumbar region: Secondary | ICD-10-CM | POA: Diagnosis not present

## 2024-06-15 DIAGNOSIS — M5126 Other intervertebral disc displacement, lumbar region: Secondary | ICD-10-CM | POA: Diagnosis not present

## 2024-06-27 ENCOUNTER — Encounter: Payer: Self-pay | Admitting: Gastroenterology

## 2024-06-27 ENCOUNTER — Encounter: Payer: Self-pay | Admitting: *Deleted

## 2024-06-27 ENCOUNTER — Ambulatory Visit: Admitting: Gastroenterology

## 2024-06-27 VITALS — BP 134/88 | HR 76 | Temp 98.0°F | Ht 67.0 in | Wt 168.2 lb

## 2024-06-27 DIAGNOSIS — K219 Gastro-esophageal reflux disease without esophagitis: Secondary | ICD-10-CM | POA: Diagnosis not present

## 2024-06-27 DIAGNOSIS — K862 Cyst of pancreas: Secondary | ICD-10-CM

## 2024-06-27 DIAGNOSIS — K5900 Constipation, unspecified: Secondary | ICD-10-CM | POA: Diagnosis not present

## 2024-06-27 DIAGNOSIS — R319 Hematuria, unspecified: Secondary | ICD-10-CM | POA: Diagnosis not present

## 2024-06-27 DIAGNOSIS — K5909 Other constipation: Secondary | ICD-10-CM

## 2024-06-27 NOTE — Progress Notes (Signed)
 GI Office Note    Referring Provider: Rosamond Leta [name], MD Primary Care Physician:  [facility] Primary Gastroenterologist: Carlin POUR. Cindie, DO  Date:  06/27/2024  ID:  [name], DOB 20-Nov-1934, MRN [id number]   Chief Complaint   Chief Complaint  Patient presents with   Follow-up    Follow up. No problems    History of Present Illness  [name] is a 89 y.o. female with a history of GERD, CAD, NSTEMI in January 2023, diabetes type 2, HTN, TIA, interstitial pneumonitis, dysphagia, constipation, hepatic cyst, and pancreatic cyst undergoing surveillance presenting today for follow-up.  EUS/FNA September 2023: - Cytology suggested mucinous cystic neoplasm, potentially IPMN - Final Intropaste pancreatin fluid analysis study showed statistically higher risk cyst based on final testing  CA 19-known normal in October 2023.  Case had been discussed with Dr. Wilhelmenia previously and he ultimately recommended close surveillance with MRI/MRCP at 34-month interval from EUS and if no dramatic change in 46-month follow-up and if stable then 1 year surveillance thereafter.  MRCP April 2024 IMPRESSION: 1. Unchanged lobulated, thinly septated fluid signal cystic lesion proximal to arising from the superior aspect of the pancreatic neck measuring 3.2 x 2.8 cm. No solid component or suspicious contrast enhancement.   40 hide  see this is most consistent with a pseudocyst or side branch IPMN and is almost certainly benign. Given advanced patient age and established initial stability, one additional surveillance MR can be considered in 2 years if clinically appropriate. 2. Unchanged, definitively benign liver cysts, for which no further follow-up or characterization is required. 3. Cholelithiasis. No evidence of acute cholecystitis or biliary dilatation.  MRCP November 2024 IMPRESSION: 1. Unchanged, lobulated thinly septated fluid signal cystic lesion arising from the  superior pancreatic neck measuring 3.3 x 2.4 cm. No solid component or suspicious contrast enhancement. No pancreatic ductal dilatation or surrounding inflammatory changes. This remains most consistent with an IPMN, and is almost certainly benign. Given advanced patient age and established imaging stability, further follow-up is of doubtful clinical value. 2.  Cholelithiasis.  Last office visit 09/21/2023 with Josette Centers, PA-C.  Denied any reflux symptoms, taking her pantoprazole  40 mg daily.  Denied any N/V/dysphagia.  No issues with constipation, sometimes she tries microcephalus a bowel movement quickly and will get some abdominal cramping but does not routine.  Had been seeing pulmonology given she was having a cough and some shortness of breath with walking.  She is recommend continue pantoprazole  40 mg daily, follow-up with pulmonology, and schedule her MRI/MRCP in November 2025.  Follow-up in 1 year or sooner if needed.  Today:  Discussed the use of AI scribe software for clinical note transcription with the patient, who gave verbal consent to proceed.  She is experiencing confusion regarding her medication regimen, particularly concerning a half tablet of metoprolol , which was reduced approximately three to six months ago. She is uncertain about the correct dosage and administration initially but now understands.  Her gastroesophageal reflux disease is well-controlled with daily pantoprazole . She relies on her pharmacy to manage refills.  She denies any breakthrough symptoms, nausea, vomiting, dysphagia.  Approximately two weeks ago, she experienced an episode of hematuria, with blood present during urination for two consecutive days without associated pain. She has not yet discussed this with her current PCP.  She reports that eating bran cereal every morning helps control her constipation, she does not eat the cereal that she does have difficulty with her bowel movements.  Denies any bright  red blood per rectum or melena.  She describes a recent fall that occurred the previous night, resulting in bruising and soreness on her chest, knee, and shoulder. She landed on her left side and has a history of knee issues; she reported that her knee needs replacing, but this has not been performed due to her age and condition. She also reports swelling and visible veins in the affected knee.  She has a history of gout and sciatica, with the gout being triggered by shellfish consumption, which she now avoids. She monitors her blood sugar daily, noting occasional elevations when she 'feasts,' but generally maintains good control.  No recent abdominal pain, changes in bowel movements, or appetite issues. Her weight remains stable between 165 and 174 pounds. No shortness of breath or chest pain recently.       Wt Readings from Last 6 Encounters:  06/27/24 168 lb 3.2 oz (76.3 kg)  05/23/24 166 lb (75.3 kg)  02/21/24 165 lb (74.8 kg)  12/18/23 172 lb (78 kg)  11/14/23 171 lb 9.6 oz (77.8 kg)  09/21/23 173 lb (78.5 kg)   Body mass index is 26.34 kg/m.  Current Outpatient Medications  Medication Sig Dispense Refill   allopurinol  (ZYLOPRIM ) 100 MG tablet Take 1 tablet (100 mg total) by mouth daily. 30 tablet 1   aspirin  EC 81 MG tablet Take 81 mg by mouth daily. Swallow whole.     atorvastatin (LIPITOR) 80 MG tablet Take 80 mg by mouth daily.     ciclopirox (PENLAC) 8 % solution Apply topically.     cyanocobalamin  (VITAMIN B12) 1000 MCG tablet Take 1,000 mcg by mouth daily.     empagliflozin  (JARDIANCE ) 10 MG TABS tablet Take 1 tablet (10 mg total) by mouth daily before breakfast. 28 tablet 0   empagliflozin  (JARDIANCE ) 10 MG TABS tablet Take 1 tablet (10 mg total) by mouth daily before breakfast. 90 tablet 2   furosemide  (LASIX ) 20 MG tablet Take 1 tablet (20 mg total) by mouth daily. 30 tablet 1   glipiZIDE (GLUCOTROL XL) 5 MG 24 hr tablet Take 5 mg by mouth 2 (two) times daily.      ipratropium (ATROVENT ) 0.03 % nasal spray Place 2 sprays in each nostril up to three times daily as needed for runny nose 30 mL 5   latanoprost (XALATAN) 0.005 % ophthalmic solution Place 1 drop into both eyes at bedtime.     metoprolol  succinate (TOPROL  XL) 25 MG 24 hr tablet Take 0.5 tablets (12.5 mg total) by mouth daily. 45 tablet 3   nerandomilast  (JASCAYD ) 18 MG tablet Take 18 mg by mouth every 12 (twelve) hours. 60 tablet 3   nitroGLYCERIN  (NITROSTAT ) 0.4 MG SL tablet DISSOLVE 1 TABLET UNDER THE TONGUE EVERY 5 MINUTES AS NEEDED FOR CHEST PAIN. DO NOT EXCEED A TOTAL OF 3 DOSES IN 15 MINUTES. 25 tablet 3   pantoprazole  (PROTONIX ) 40 MG tablet Take 1 tablet (40 mg total) by mouth daily before breakfast. 30 tablet 11   valsartan  (DIOVAN ) 160 MG tablet TAKE 1 TABLET BY MOUTH EVERY DAY 90 tablet 3   gabapentin  (NEURONTIN ) 100 MG capsule Take 100 mg by mouth 4 (four) times daily. (Patient taking differently: Take 200 mg by mouth 2 (two) times daily.)     No current facility-administered medications for this visit.    Past Medical History:  Diagnosis Date   CAD (coronary artery disease)    a. s/p NSTEMI in 07/2021 with cath showing  minimal CAD and no culprit lesion identified   Constipation    Cramps, extremity    legs and hands   Diabetes (HCC)    type II   Enterococcus UTI 02/03/2016   H/O cardiovascular stress test 03/05/2009   normal, no ischemia   H/O Doppler ultrasound    lower ext arterial doppler 04/13/09-no evidence of arterial insufficiency, normal values; distal aorta 2.4x2.7   HTN (hypertension)    Murmur, cardiac    echo 02/25/13- EF 60-65%, impaired relaxation-grade 1 diastolic dysfunction, mild concentric lvh, mild to moderate aortic regurgitation   Myocardial infarction [facility])    january   Pancreatic cyst    Pneumonia    May 2017   Postoperative anemia due to acute blood loss 02/02/2016   Primary localized osteoarthritis of right knee    Recurrent upper respiratory  infection (URI)    TIA (transient ischemic attack)    carotid doppler 05/08/11-normal patency    Past Surgical History:  Procedure Laterality Date   ABDOMINAL HYSTERECTOMY     BIOPSY  03/31/2022   Procedure: BIOPSY;  Surgeon: Wilhelmenia Aloha Raddle., MD;  Location: [facility];  Service: Gastroenterology;;   BUNIONECTOMY Left    COLONOSCOPY  04/29/2010   Dr. Donnald; diverticulosis in the entire colon, otherwise normal exam. Recommended repeat colonoscopy in 5 years for surveillance due to personal history of polyps.   ESOPHAGOGASTRODUODENOSCOPY (EGD) WITH PROPOFOL  N/A 03/31/2022   Procedure: ESOPHAGOGASTRODUODENOSCOPY (EGD) WITH PROPOFOL ;  Surgeon: Wilhelmenia Aloha Raddle., MD;  Location: [facility];  Service: Gastroenterology;  Laterality: N/A;   EUS N/A 03/31/2022   Procedure: UPPER ENDOSCOPIC ULTRASOUND (EUS) LINEAR;  Surgeon: Wilhelmenia Aloha Raddle., MD;  Location: [facility];  Service: Gastroenterology;  Laterality: N/A;   FINE NEEDLE ASPIRATION  03/31/2022   Procedure: FINE NEEDLE ASPIRATION (FNA) LINEAR;  Surgeon: Wilhelmenia Aloha Raddle., MD;  Location: [facility];  Service: Gastroenterology;;   HARLEY DILATION N/A 03/31/2022   Procedure: HARLEY DILATION;  Surgeon: Wilhelmenia Aloha Raddle., MD;  Location: [facility];  Service: Gastroenterology;  Laterality: N/A;   TOTAL KNEE ARTHROPLASTY Right 02/01/2016   Procedure: TOTAL KNEE ARTHROPLASTY;  Surgeon: Lamar Millman, MD;  Location: [facility];  Service: Orthopedics;  Laterality: Right;    Family History  Problem Relation Age of Onset   Stroke Mother    Cancer Father    Lung cancer Father    Cancer Sister    Diabetes Brother    Diabetes Brother    Diabetes Brother    Pancreatic cancer Neg Hx    Allergic rhinitis Neg Hx    Angioedema Neg Hx    Asthma Neg Hx    Eczema Neg Hx     Allergies as of 06/27/2024 - Review Complete 06/27/2024  Allergen Reaction Noted   Latex Itching 04/24/2023   Other Itching 03/28/2022    Codeine Nausea Only 10/17/2014   Milk-related compounds Diarrhea 11/03/2015    Social History   Socioeconomic History   Marital status: Widowed    Spouse name: Not on file   Number of children: 2   Years of education: Not on file   Highest education level: Not on file  Occupational History   Not on file  Tobacco Use   Smoking status: Never   Smokeless tobacco: Never  Vaping Use   Vaping status: Never Used  Substance and Sexual Activity   Alcohol  use: No    Alcohol /week: 0.0 standard drinks of alcohol    Drug use: No   Sexual activity: Not on file  Other Topics Concern   Not on file  Social History Narrative   Step son lives with her.  She is a widow.   Social Drivers of Health   Tobacco Use: Low Risk (06/27/2024)   Patient History    Smoking Tobacco Use: Never    Smokeless Tobacco Use: Never    Passive Exposure: Not on file  Financial Resource Strain: Not on file  Food Insecurity: No Food Insecurity (12/05/2022)   Hunger Vital Sign    Worried About Running Out of Food in the Last Year: Never true    Ran Out of Food in the Last Year: Never true  Transportation Needs: No Transportation Needs (12/05/2022)   PRAPARE - Administrator, Civil Service (Medical): No    Lack of Transportation (Non-Medical): No  Physical Activity: Not on file  Stress: Not on file  Social Connections: Not on file  Depression (PHQ2-9): Low Risk (04/10/2023)   Depression (PHQ2-9)    PHQ-2 Score: 0  Alcohol  Screen: Not on file  Housing: Low Risk (12/05/2022)   Housing    Last Housing Risk Score: 0  Utilities: Not At Risk (12/05/2022)   AHC Utilities    Threatened with loss of utilities: No  Health Literacy: Not on file    Review of Systems   Gen: Denies fever, chills, anorexia. Denies fatigue, weakness, weight loss.  CV: Denies chest pain, palpitations, syncope, peripheral edema, and claudication. Resp: Denies dyspnea at rest, cough, wheezing, coughing up blood, and  pleurisy. GI: See HPI Derm: Denies rash, itching, dry skin Psych: Denies depression, anxiety, memory loss, confusion. No homicidal or suicidal ideation.  Heme: Denies bruising, bleeding, and enlarged lymph nodes.  Physical Exam   BP 134/88 (BP Location: Right Arm, Patient Position: Sitting, Cuff Size: Normal)   Pulse 76   Temp 98 F (36.7 C) (Temporal)   Ht 5' 7 (1.702 m)   Wt 168 lb 3.2 oz (76.3 kg)   BMI 26.34 kg/m   General:   Alert and oriented. No distress noted. Pleasant and cooperative.  Head:  Normocephalic and atraumatic. Eyes:  Conjuctiva clear without scleral icterus. Mouth:  Oral mucosa pink and moist. Good dentition. No lesions. Lungs:  Clear to auscultation bilaterally. No wheezes, rales, or rhonchi. No distress.  Heart:  S1, S2 present without murmurs appreciated.  Abdomen:  +BS, soft, non-tender and non-distended. No rebound or guarding. No HSM or masses noted. Rectal: deferred Msk:  Symmetrical without gross deformities. Normal posture. Extremities:  Without edema. Neurologic:  Alert and  oriented x4 Psych:  Alert and cooperative. Normal mood and affect.  Assessment & Plan  [name] is a 89 y.o. female presenting today for follow-up on constipation, GERD, pancreatic lesion.    Pancreatic lesion History of pancreatic lesion and she underwent EUS and FNA in September 2023 with cytology suggesting mucinous cystic neoplasm/potentially IPMN and fluid analysis shows statistically higher risk cyst and Dr. Wilhelmenia with [facility] had recommended ongoing surveillance with MRCP's.  She had her last MRCP's in April 2024 and November 2024 with unchanged cystic lesion.  Recent MRI of the back was performed, but pancreatic MRI was not included.  She is currently slightly over due for her 1 year surveillance.  No alarm symptoms present. - Ordered MRCP for pancreatic lesion evaluation - Advised to monitor for alarm symptoms like weight loss, lack of appetite, abdominal  pain, diarrhea.  Gastroesophageal reflux disease Well-controlled with daily pantoprazole . No symptoms of nausea, vomiting, or dysphagia  reported. - Continue pantoprazole  40 mg daily  Constipation Managed with daily cereal intake (Bran), which maintains regular bowel movements. No straining or hematochezia reported. - Continue daily cereal intake to maintain regular bowel movements  - Continue high-fiber diet.     Follow up   Follow up 1 year.   Encouraged to follow up with PCP for reports of hematuria   Charmaine Melia, MSN, FNP-BC, AGACNP-BC [facility]

## 2024-06-27 NOTE — Patient Instructions (Signed)
 We will get you scheduled for MRI of your pancreas in the near future.  Continue taking your pantoprazole  40 mg once daily for your reflux.  Continue to eat your raisin bran/extra fiber daily to help control your constipation.  Please make sure you follow-up with your primary care in regards to recent blood in your urine.  Please let us  know if you start to develop any diarrhea, worsening constipation, changes in appetite, or significant weight loss.  It was a pleasure to see you today. I want to create trusting relationships with patients. If you receive a survey regarding your visit,  I greatly appreciate you taking time to fill this out on paper or through your MyChart. I value your feedback.  Charmaine Melia, MSN, FNP-BC, AGACNP-BC [facility]

## 2024-06-27 NOTE — Telephone Encounter (Signed)
 Pt has been informed of MRI/MRCP appt date, time and details.

## 2024-07-08 ENCOUNTER — Ambulatory Visit (HOSPITAL_COMMUNITY)
Admission: RE | Admit: 2024-07-08 | Discharge: 2024-07-08 | Disposition: A | Discharge status: Home / Self Care | Source: Ambulatory Visit | Attending: Gastroenterology | Admitting: Gastroenterology

## 2024-07-08 ENCOUNTER — Other Ambulatory Visit: Payer: Self-pay | Admitting: Gastroenterology

## 2024-07-08 DIAGNOSIS — K862 Cyst of pancreas: Secondary | ICD-10-CM

## 2024-07-08 MED ORDER — GADOBUTROL 1 MMOL/ML IV SOLN
7.0000 mL | Freq: Once | INTRAVENOUS | Status: AC | PRN
  Administered 2024-07-08: 7 mL via INTRAVENOUS

## 2024-07-22 ENCOUNTER — Ambulatory Visit: Payer: Self-pay | Admitting: Gastroenterology

## 2024-07-25 NOTE — Progress Notes (Signed)
 MRI/MRCP NIC'd for 1 yr

## 2024-08-01 ENCOUNTER — Other Ambulatory Visit: Payer: Self-pay | Admitting: *Deleted

## 2024-08-01 DIAGNOSIS — K862 Cyst of pancreas: Secondary | ICD-10-CM

## 2024-08-01 DIAGNOSIS — K869 Disease of pancreas, unspecified: Secondary | ICD-10-CM

## 2024-08-15 ENCOUNTER — Telehealth: Payer: Self-pay

## 2024-08-15 NOTE — Telephone Encounter (Signed)
 Pt called and left a vm requesting to speak with the nurse. Pt last seen [name] on 06/27/2024.

## 2024-08-16 NOTE — Telephone Encounter (Signed)
 LMOM for pt to call office

## 2024-08-27 ENCOUNTER — Ambulatory Visit: Admitting: Internal Medicine

## 2024-08-28 ENCOUNTER — Ambulatory Visit: Admitting: Pulmonary Disease
